# Patient Record
Sex: Female | Born: 1945
Health system: Southern US, Community
[De-identification: ages and names within clinical notes are randomized; demographics above are authoritative.]

## PROBLEM LIST (undated history)

## (undated) DIAGNOSIS — I10 Essential (primary) hypertension: Secondary | ICD-10-CM

## (undated) DIAGNOSIS — Z951 Presence of aortocoronary bypass graft: Secondary | ICD-10-CM

## (undated) DIAGNOSIS — I712 Thoracic aortic aneurysm, without rupture, unspecified: Secondary | ICD-10-CM

## (undated) DIAGNOSIS — I255 Ischemic cardiomyopathy: Secondary | ICD-10-CM

## (undated) DIAGNOSIS — F17200 Nicotine dependence, unspecified, uncomplicated: Secondary | ICD-10-CM

## (undated) DIAGNOSIS — I447 Left bundle-branch block, unspecified: Secondary | ICD-10-CM

## (undated) DIAGNOSIS — I219 Acute myocardial infarction, unspecified: Secondary | ICD-10-CM

## (undated) DIAGNOSIS — R0602 Shortness of breath: Secondary | ICD-10-CM

## (undated) DIAGNOSIS — I2510 Atherosclerotic heart disease of native coronary artery without angina pectoris: Secondary | ICD-10-CM

## (undated) HISTORY — PX: ABDOMINAL HYSTERECTOMY: SHX81

## (undated) HISTORY — PX: TUBAL LIGATION: SHX77

## (undated) HISTORY — PX: FRACTURE SURGERY: SHX138

## (undated) HISTORY — PX: DILATION AND CURETTAGE OF UTERUS: SHX78

---

## 2003-12-01 ENCOUNTER — Encounter: Admission: RE | Admit: 2003-12-01 | Discharge: 2004-02-29 | Payer: Self-pay | Admitting: Neurology

## 2010-08-11 ENCOUNTER — Emergency Department (HOSPITAL_COMMUNITY): Payer: Medicare Other

## 2010-08-11 ENCOUNTER — Inpatient Hospital Stay (HOSPITAL_COMMUNITY)
Admission: EM | Admit: 2010-08-11 | Discharge: 2010-08-15 | DRG: 481 | Disposition: A | Discharge status: Skilled Nursing Facility | Payer: Medicare Other | Attending: Orthopedic Surgery | Admitting: Orthopedic Surgery

## 2010-08-11 DIAGNOSIS — I252 Old myocardial infarction: Secondary | ICD-10-CM

## 2010-08-11 DIAGNOSIS — A5900 Urogenital trichomoniasis, unspecified: Secondary | ICD-10-CM | POA: Diagnosis present

## 2010-08-11 DIAGNOSIS — W010XXA Fall on same level from slipping, tripping and stumbling without subsequent striking against object, initial encounter: Secondary | ICD-10-CM | POA: Diagnosis present

## 2010-08-11 DIAGNOSIS — S72009A Fracture of unspecified part of neck of unspecified femur, initial encounter for closed fracture: Principal | ICD-10-CM | POA: Diagnosis present

## 2010-08-11 DIAGNOSIS — E876 Hypokalemia: Secondary | ICD-10-CM | POA: Diagnosis not present

## 2010-08-11 DIAGNOSIS — I82409 Acute embolism and thrombosis of unspecified deep veins of unspecified lower extremity: Secondary | ICD-10-CM | POA: Diagnosis present

## 2010-08-11 DIAGNOSIS — D62 Acute posthemorrhagic anemia: Secondary | ICD-10-CM | POA: Diagnosis not present

## 2010-08-11 DIAGNOSIS — I10 Essential (primary) hypertension: Secondary | ICD-10-CM | POA: Diagnosis present

## 2010-08-11 LAB — CBC
Hemoglobin: 10.7 g/dL — ABNORMAL LOW (ref 12.0–15.0)
MCH: 27.6 pg (ref 26.0–34.0)
MCHC: 32.3 g/dL (ref 30.0–36.0)
Platelets: 182 10*3/uL (ref 150–400)

## 2010-08-11 LAB — CK: Total CK: 448 U/L — ABNORMAL HIGH (ref 7–177)

## 2010-08-11 LAB — DIFFERENTIAL
Basophils Relative: 1 % (ref 0–1)
Eosinophils Absolute: 0 10*3/uL (ref 0.0–0.7)
Eosinophils Relative: 0 % (ref 0–5)
Monocytes Absolute: 0.6 10*3/uL (ref 0.1–1.0)
Monocytes Relative: 6 % (ref 3–12)

## 2010-08-11 LAB — URINALYSIS, ROUTINE W REFLEX MICROSCOPIC
Hgb urine dipstick: NEGATIVE
Nitrite: NEGATIVE
Specific Gravity, Urine: 1.021 (ref 1.005–1.030)
Urobilinogen, UA: 2 mg/dL — ABNORMAL HIGH (ref 0.0–1.0)

## 2010-08-11 LAB — COMPREHENSIVE METABOLIC PANEL
Albumin: 2.6 g/dL — ABNORMAL LOW (ref 3.5–5.2)
BUN: 10 mg/dL (ref 6–23)
Chloride: 101 mEq/L (ref 96–112)
Creatinine, Ser: 0.52 mg/dL (ref 0.4–1.2)
Total Bilirubin: 1.2 mg/dL (ref 0.3–1.2)

## 2010-08-11 LAB — PROTIME-INR
INR: 1 (ref 0.00–1.49)
Prothrombin Time: 13.4 seconds (ref 11.6–15.2)

## 2010-08-11 LAB — URINE MICROSCOPIC-ADD ON

## 2010-08-11 LAB — TROPONIN I: Troponin I: 0.06 ng/mL (ref 0.00–0.06)

## 2010-08-11 LAB — APTT: aPTT: 34 seconds (ref 24–37)

## 2010-08-12 DIAGNOSIS — M79609 Pain in unspecified limb: Secondary | ICD-10-CM

## 2010-08-12 LAB — URINE CULTURE
Colony Count: NO GROWTH
Culture  Setup Time: 201203182353

## 2010-08-12 LAB — CBC
Platelets: 131 10*3/uL — ABNORMAL LOW (ref 150–400)
RDW: 16.7 % — ABNORMAL HIGH (ref 11.5–15.5)
WBC: 9.9 10*3/uL (ref 4.0–10.5)

## 2010-08-12 LAB — BASIC METABOLIC PANEL
Calcium: 9.1 mg/dL (ref 8.4–10.5)
GFR calc Af Amer: >60 mL/min (ref >60)
GFR calc non Af Amer: >60 mL/min (ref >60)
Potassium: 4.1 mEq/L (ref 3.5–5.1)
Sodium: 139 mEq/L (ref 135–145)

## 2010-08-13 LAB — CBC
HCT: 24.9 % — ABNORMAL LOW (ref 36.0–46.0)
MCH: 27.3 pg (ref 26.0–34.0)
MCV: 87.1 fL (ref 78.0–100.0)
Platelets: 112 10*3/uL — ABNORMAL LOW (ref 150–400)
RDW: 16.9 % — ABNORMAL HIGH (ref 11.5–15.5)

## 2010-08-13 LAB — BASIC METABOLIC PANEL
CO2: 27 mEq/L (ref 19–32)
Calcium: 9 mg/dL (ref 8.4–10.5)
Chloride: 106 mEq/L (ref 96–112)
Glucose, Bld: 90 mg/dL (ref 70–99)
Sodium: 136 mEq/L (ref 135–145)

## 2010-08-13 LAB — ABO/RH: ABO/RH(D): O POS

## 2010-08-14 LAB — BASIC METABOLIC PANEL
BUN: 3 mg/dL — ABNORMAL LOW (ref 6–23)
CO2: 26 mEq/L (ref 19–32)
Calcium: 9.1 mg/dL (ref 8.4–10.5)
Chloride: 104 mEq/L (ref 96–112)
Creatinine, Ser: 0.49 mg/dL (ref 0.4–1.2)
GFR calc Af Amer: >60 mL/min (ref >60)

## 2010-08-14 LAB — CBC
HCT: 33.1 % — ABNORMAL LOW (ref 36.0–46.0)
MCHC: 32.9 g/dL (ref 30.0–36.0)
Platelets: 112 10*3/uL — ABNORMAL LOW (ref 150–400)
RDW: 15.6 % — ABNORMAL HIGH (ref 11.5–15.5)
WBC: 10.8 10*3/uL — ABNORMAL HIGH (ref 4.0–10.5)

## 2010-08-14 LAB — PROTIME-INR
INR: 1.27 (ref 0.00–1.49)
Prothrombin Time: 16.1 seconds — ABNORMAL HIGH (ref 11.6–15.2)

## 2010-08-14 LAB — CROSSMATCH
ABO/RH(D): O POS
Antibody Screen: NEGATIVE
Unit division: 0
Unit division: 0

## 2010-08-15 LAB — BASIC METABOLIC PANEL
BUN: 6 mg/dL (ref 6–23)
Calcium: 9.1 mg/dL (ref 8.4–10.5)
Creatinine, Ser: 0.46 mg/dL (ref 0.4–1.2)
GFR calc non Af Amer: >60 mL/min (ref >60)
Glucose, Bld: 180 mg/dL — ABNORMAL HIGH (ref 70–99)

## 2010-08-15 LAB — PROTIME-INR: Prothrombin Time: 15.5 seconds — ABNORMAL HIGH (ref 11.6–15.2)

## 2010-08-15 LAB — CBC
HCT: 32.9 % — ABNORMAL LOW (ref 36.0–46.0)
MCH: 27.8 pg (ref 26.0–34.0)
MCHC: 32.2 g/dL (ref 30.0–36.0)
MCV: 86.4 fL (ref 78.0–100.0)
Platelets: 136 10*3/uL — ABNORMAL LOW (ref 150–400)
RDW: 16.1 % — ABNORMAL HIGH (ref 11.5–15.5)
WBC: 11.2 10*3/uL — ABNORMAL HIGH (ref 4.0–10.5)

## 2010-08-19 NOTE — H&P (Signed)
NAME:  Kathryn Dixon, Kathryn Dixon               ACCOUNT NO.:  [id number]  MEDICAL RECORD NO.:  [id number]           PATIENT TYPE:  I  LOCATION:  1533                         FACILITY:  [facility]  PHYSICIAN:  Leonides Grills, M.D.     DATE OF BIRTH:  04-07-1946  DATE OF ADMISSION:  08/11/2010 DATE OF DISCHARGE:                             HISTORY & PHYSICAL   CHIEF COMPLAINT:  Left hip pain for 1 week.  HISTORY:  This is a 65 year old female who approximately a week ago fell at home and had immediate left hip pain.  She has been bed bound for a week.  She was brought in today by EMS for further evaluation and treatment.  She denies any diabetes, peripheral neuropathy, rheumatoid arthritis, gout, lupus, fever, chills, DVT history, or sleep apnea.  She does have a history of MI  approximately 10-12 years ago.  PAST MEDICAL HISTORY: 1. Hypertension. 2. MI.  PAST SURGICAL HISTORY:  None.  ALLERGIES:  She has no known drug allergies.  MEDICATIONS:  She does not take any medicine.  SOCIAL HISTORY:  She does smoke.  PHYSICAL EXAMINATION:  GENERAL:  She is a well-nourished, well- developed, very pleasant female, in no apparent distress, alert and oriented x3. VITAL SIGNS:  Respirations 18, blood pressure 120/74, temperature is 98.3, pulse is 89. HEENT:  She Normocephalic, atraumatic.  Extraocular movements are intact, equal bilaterally. CHEST:  Equal bilateral expansion, contracted breathing.  Chest is clear to auscultation. HEART:  There are no murmurs, rubs, or gallops. ABDOMEN:  Soft, nontender.  Normal bowel sounds. NEURO:  Sensation intact to light touch over the C5 through T1 distribution as well as over the L4 through S1 distribution, equal bilaterally. EXTREMITIES:  She has palpable radial pulse bilaterally and palpable dorsalis pedis pulse bilaterally, but it is very faint in bilateral lower extremities.  We did not range the hip due to pain in the hip, obviously, left hip.  She is  nontender in bilateral knees, ankles, feet, shoulders, elbows, wrists, and hands.  IMAGING STUDIES:  X-rays, AP and lateral views of the hip show a nondisplaced left femoral neck fracture.  Four views of the knee show no evidence of fracture.  Chest x-ray was clear.  No acute cardiopulmonary disease.  EKG was also reviewed and shows sinus tachycardia, left atrial enlargement, left axis deviation, and left bundle-branch block per the EKG read.  LABORATORY DATA:  White count is 9.2, hemoglobin is 10.7, hematocrit is 33.1, platelets are 182.  Sodium is 129, potassium is 3.3, chloride is 101, carbon dioxide is 26, glucose is 138, BUN is 10, creatinine 0.52. Urine shows moderate leukocyte esterase, many epithelials, white blood cells 11-20, and many bacteria and cultures are pending.  IMPRESSION: 1. Nondisplaced left femoral neck fracture. 2. Probable urinary tract infection.  PLAN:  We will get preop clearance by Internal Medicine.  We explained to the patient that due to the fact that she has a hip fracture, this will need closed reduction and percutaneous screw fixation and she wished to proceed.  We went over the risks of infection, vessel injury, loss of fixation, screw cut  out, persistent pain, worsening, prolonged recovery, stiffness, arthritis, wound healing problems, DVT, PE, and even death were all explained.  Questions were encouraged and answered. We will proceed with this in the near future.     Leonides Grills, M.D.     PB/MEDQ  D:  08/11/2010  T:  08/12/2010  Job:  [id number]  Electronically Signed by Leonides Grills M.D. on 08/19/2010 05:18:34 PM

## 2010-08-19 NOTE — Op Note (Signed)
NAME:  Kathryn Dixon, Kathryn Dixon               ACCOUNT NO.:  [id number]  MEDICAL RECORD NO.:  [id number]           PATIENT TYPE:  E  LOCATION:  WLED                         FACILITY:  [facility]  PHYSICIAN:  Leonides Grills, M.D.     DATE OF BIRTH:  06/22/1945  DATE OF PROCEDURE:  08/11/2010 DATE OF DISCHARGE:                              OPERATIVE REPORT   PREOPERATIVE DIAGNOSIS:  Nondisplaced left femoral neck fracture.  POSTOPERATIVE DIAGNOSIS:  Nondisplaced left femoral neck fracture.  OPERATION:  Closed reduction screw fixation left femoral neck fracture.  ANESTHESIA:  General.  SURGEON:  Leonides Grills, M.D.  ASSISTANT:  Richardean Canal, P.A.  ESTIMATED BLOOD LOSS:  Minimal.  COMPLICATIONS:  None.  IMPLANTS:  Synthes 6.5 mm cannulated screws x3.  DISPOSITION:  Stable to the PR.  INDICATIONS:  This is a 65 year old female who fell about a week ago and has had persistent left hip pain and has been essentially bed-bound. She then presented to the ER, her x-rays were obtained, and she was found to have a nondisplaced left femoral neck fracture.  She consented for the above procedure.  All risks of infection or vessel injury, nonunion, malunion, hardware rotation, hardware failure, screw head cutout, persistent pain, worse pain, prolonged recovery, wound healing problems, DVT, PE were all explained, questions were encouraged and answered.  OPERATION:  The patient brought to the operating room, placed in supine position after adequate general anesthesia was administered as well as Ancef 1 g IV piggyback.  The right lower extremity was placed in lithotomy position and the left lower extremity was placed in a straight extension position with slight traction, a very slight reduction.  A light general internal rotation was then performed and x-rays were obtained in AP and lateral planes that showed anatomic reduction.  We then prepped and draped the left hip in a sterile manner and under  C-arm guidance we mapped out the orientation of the left femoral neck.  We then made a longitudinal incision over the lateral aspect of the femur. We then placed a guide wire down first in the center of the head and then placed 2 guide wires both anterior and posterior to the inferior surface of the femoral neck, all of which parallel in orientation, this was verified under C-arm guidance in AP and lateral planes.  We then measured this out and 2 screws were 85 mm long and 1 screw was 80 mm long.  We then placed three 6.5 mm cannulated Synthes titanium screws self-drilling, self-tapping, 2 of which had 32 mm thread and 1 of which had 16 mm thread.  This had excellent purchase and maintenance of the anatomic reduction.  K-wires were removed.  Final x-rays were obtained in AP and lateral planes that showed no gross motion, fixation, proposition, excellent alignment as well.  The wound was copiously irrigated with saline.  Subcu was closed with #0 Vicryl and skin was closed with staples.  Sterile dressing was applied.  The patient was stable to PR.     Leonides Grills, M.D.     PB/MEDQ  D:  08/11/2010  T:  08/12/2010  Job:  [id number]  Electronically Signed by Leonides Grills M.D. on 08/19/2010 05:18:36 PM

## 2010-09-14 NOTE — Discharge Summary (Signed)
NAME:  Kathryn Dixon, Kathryn Dixon               ACCOUNT NO.:  [id number]  MEDICAL RECORD NO.:  [id number]           PATIENT TYPE:  I  LOCATION:  1533                         FACILITY:  [facility]  PHYSICIAN:  Leonides Grills, M.D.     DATE OF BIRTH:  27-Sep-1945  DATE OF ADMISSION:  08/11/2010 DATE OF DISCHARGE:                              DISCHARGE SUMMARY   ADMITTING DIAGNOSES: 1. Hypokalemia. 2. Urinary tract infection. 3. Anemia. 4. Left bundle-branch block with sinus tachycardia. 5. Left hip pain. 6. Hypertension. 7. Myocardial infarction.  DISCHARGE DIAGNOSES: 1. Status post closed reduction, screw fixation, left femoral neck     fracture. 2. Hypokalemia, resolved. 3. Acute blood loss anemia secondary to surgery on chronic anemia     status post 2 units of packed red blood cells. 4. Hypokalemia, resolved. 5. Hypertension. 6. History of myocardial infarction. 7. Hypomagnesemia, resolved . 8. Urinary tract infection/Trichomonas infection, treated. 9. Left lower leg deep venous thrombosis on Doppler, currently on     Coumadin with Lovenox bridge until Coumadin therapeutic.  HISTORY OF PRESENT ILLNESS:  This is a 65 year old female who fell approximately a week ago, had persistent left hip pain, has been essentially bed bound.  The patient was involuntarily admitted by family, brought to the ER by EMS for evaluation and treatment of the left hip.  She denies any loss of consciousness, chest pain, or dizziness at the time of fall.  The only injury sustained during the fall was the left hip injury.  She has been unable to bear weight on the left hip since the injury.  She denies diabetes, peripheral neuropathy, rheumatoid arthritis, gout, lupus, fevers, chills, DVT history, or sleep apnea.  She does have a history of MI approximately 10-12 years ago and hypertension.  PAST MEDICAL HISTORY: 1. Hypertension. 2. History of MI 10-12 years ago.  PAST SURGICAL HISTORY:  None.  DRUG  ALLERGIES:  None.  MEDICATIONS:  None.  SOCIAL HISTORY:  She smokes.  She lives alone.  SURGICAL PROCEDURES:  The patient was taken to the operating room on August 11, 2010, by Leonides Grills, assisted by Richardean Canal, PA-C.  The patient was placed under general anesthesia and then underwent a closed screw fixation of the left femoral neck fracture.  The patient tolerated the procedure well.  HOSPITAL COURSE:  In the PACU, there was some concern of possible alcohol abuse, therefore, Ativan protocol was initiated.  Postop day #1, the patient overall stated that she felt much improved.  Afebrile, vital signs stable.  Doppler was performed of the patient's bilateral lower extremities.  The patient was found to have a left lower extremity DVT occlusive of the proximal posterior tibial, popliteal, femoral, and common femoral vein and also superficial thrombosis of the proximal greater saphenous vein.  No evidence of Baker cyst.  The patient was placed on bed rest for 24 hours, full dose Lovenox therapy, Coumadin per DVT protocol per pharmacy.  Postop day #2, the patient doing well. Denied shortness of breath, chest pain, dizziness.  The patient has been on strict bed rest.  The patient afebrile, vital signs stable.  White  count 10,400, hemoglobin 7.8, hematocrit 24.9.  PT 15.8, INR of 1.24. UA showed trace ketones, negative protein, negative nitrites, positive leukocytes and bacteria, positive Trichomonas.  The patient was typed and crossed for 2 units of packed red blood cells which were given due to her anemia.  Trichomonas was treated with Flagyl.  The patient was weightbearing as tolerated to the left leg.  The patient was also found to have hypomagnesemia and this was replaced.  It should be noted the patient was on Cipro for empirical treatment of UTI.  Postop day #3, the patient overall doing well, pain under good control, tolerating diet well, voiding well, no bowel movement.   Afebrile, vital signs stable, hemoglobin 10.9, hematocrit 33.1, white count 10,800.  PT was 16.1, INR 1.27.  Hypomagnesemia resolved with a magnesium level of 1.7.  Due to the patient's lack of bowel movement, Dulcolax 10 mg p.o./p.r. x1 was given. The patient was treated with one dose of Flagyl for Trichomonas. She is to remain on Cipro for a total of 3 days.  She is undergoing Coumadin bridging with Lovenox.  Her Lovenox was changed to preferred dosing for DVT which is 1.5 mg/kg 90 mg q.24 h.  The patient was deemed medically and orthopedically stable for discharge to skilled facility when bed available.  Postop day #4, the patient doing well, no complaints, passed bowel movement.  T-max 99.5, vital signs stable. White count 11,200, hemoglobin 10.6, hematocrit 32.9.  PT is 15.5, INR 1.2.  Sodium 135, potassium 3.5, chloride 104, bicarb 26, BUN 6, creatinine 0.46.  The patient at this point was able to transfer from bed to bedside commode and chair.  Again, she is weightbearing as tolerated.  Medically and orthopedically ready for discharge to skilled facility.  Left hip wound was benign, well approximated with staples.  CONSULTS:  Following consults were obtained while the patient was hospitalized:  PT, OT, case management, and [facility].  RADIOGRAPHS: 1. Left hip 2 views showed a nondisplaced left femoral neck fracture     and osteopenia, extensive peripheral vascular calcification also     was seen.  No evidence of right hip fracture on the AP pelvis.     This is dated August 11, 2010. 2. Four-view left knee dated August 11, 2010, showed no acute findings,     osteopenia. 3. Chest x-ray also dated August 11, 2010, showed no acute     cardiopulmonary disease. 4. Hip films dated August 11, 2010, at 1600 hours showed the patient to     be status post ORIF of a left hip fracture without evidence of     complication.  LABORATORY DATA ON ADMISSION:  UA was positive for leukocyte  esterase, white blood cells 11-20, many bacteria, and Trichomonas present.  CBC significant for hemoglobin 10.7, hematocrit 33.1, MCV 85.5.  Complete metabolic panel was significant for potassium of 3.3, creatinine 0.52, and sodium 139. Normal LFTs.  MEDICATIONS ON FLOOR: 1. Colace 100 mg b.i.d. 2. Aspirin 81 mg one p.o. daily. 3. Vitamin B1 100 mg p.o. daily. 4. Folic acid 1 mg p.o. daily. 5. Multivitamin 1 capsule p.o. daily. 6. Ativan 4 mg IV q.12 h. p.r.n. 7. Antiseptic rinse 15 mL at 8 a.m. and 8 p.m. 8. Coumadin per pharmacy protocol. 9. Lovenox 90 mg 1 injection subcu daily. 10.Morphine sulfate 3-4 mg IV q.4 h. p.r.n. 11.Reglan 10 mg p.o. q.8 h. p.r.n. 12.Reglan 10 mg IV q.8 h. p.r.n. 13.Oxycodone 5/325 one to two p.o. q.4-6  h. p.r.n. pain. 14.Robaxin 500 mg one p.o. q.8 h. p.r.n. 15.Ventolin 2.5 mg q.6 h. p.r.n. 16.Robaxin 500 mg IV q.8 h. p.r.n.  DISCHARGE INSTRUCTIONS: 1. Floor meds to be adjusted by skilled nursing facility physician. 2. Weightbearing status.  The patient is weightbearing as tolerated on     the left leg. 3. Wound care.  Wound is to be kept clean, dry with daily dressing     changes.  Staples come out at 2 weeks postop at her first postop     visit with Dr. Lestine Box. 4. Diet.  Regular diet.  SPECIAL INSTRUCTIONS:  The patient on Coumadin for DVT of the left lower leg with Lovenox bridging.  Discontinue Lovenox once Coumadin therapeutic.  FOLLOWUP:  The patient to follow up with Dr. Lestine Box in office 2 weeks postop.  Please call office at [phone] for appointment.  CONDITION ON DISCHARGE:  The patient discharged to skilled facility in good, stable condition.     Richardean Canal, P.A.   ______________________________ Leonides Grills, M.D.    GC/MEDQ  D:  08/15/2010  T:  08/15/2010  Job:  [id number]  cc:   Leonides Grills, M.D. Fax: [phone]  Electronically Signed by Richardean Canal P.A. on 08/21/2010 02:46:18 PM Electronically Signed by Leonides Grills M.D. on 09/14/2010 07:49:23 AM

## 2010-09-26 NOTE — Consult Note (Signed)
NAME:  Kathryn Dixon, Kathryn Dixon NO.:  [id number]  MEDICAL RECORD NO.:  [id number]           PATIENT TYPE:  E  LOCATION:  WLED                         FACILITY:  [facility]  PHYSICIAN:  Jonny Ruiz, MD    DATE OF BIRTH:  [birth year]  DATE OF CONSULTATION:  08/11/2010 DATE OF DISCHARGE:                                CONSULTATION   REFERRING PHYSICIAN:  Orthopedics.  CHIEF COMPLAINT:  Clearance for surgery.  HISTORY OF PRESENT ILLNESS:  The patient is a 65 year old female who presented to the emergency department 8 days after a fall at home. Evaluation in the ED revealed the presence of a nondisplaced left hip fracture and she was incidentally found with hypokalemia, UTI, anemia, left bundle branch block with sinus tachycardia.  We have been asked to evaluate the patient for preop evaluation.  The patient was found sitting on a stretcher and alert.  She was in on distress.  She complains of left hip pain with movement only.  She specifically denies chest pain, shortness of breath, or palpitations. She denies dysuria, frequency, hematuria, fever, or chills.  The patient tells me that her only past medical history is high blood pressure for which she was not taking any medication and a myocardial infarction 15 years ago for which she was admitted to [facility] and had a cardiac catheterization.  The patient does not take any medications including aspirin for her previous MI.  PAST MEDICAL HISTORY:  As detailed in the HPI.  CURRENT MEDICATIONS:  No known drug allergies.  FAMILY HISTORY:  Noncontributory.  PAST SURGICAL HISTORY:  None.  SOCIAL HISTORY:  The patient is a current smoker, no alcohol, no illicits.  REVIEW OF SYSTEMS:  As in HPI.  PHYSICAL EXAMINATION:  VITAL SIGNS:  Blood pressure 108/84, heart rate 116, respiration 20, temperature 98.3. GENERAL APPEARANCE:  She is a slim African American woman, in no acute distress. HEENT:   Unremarkable. NECK:  Supple and no JVD. HEART:  With a displaced PMI, regular S1 and S2.  There is an end- diastolic murmur at the precordium, with an S3 gallop. LUNGS:  Clear to auscultation. ABDOMEN:  Nondistended, soft without tenderness or rebounding.  No hepatosplenomegaly or masses. EXTREMITIES:  Without edema. NEUROLOGIC:  Nonfocal.  LABORATORY DATA:  Reviewed, she was found on the urinalysis with positive leukocyte esterase, WBCs 11-20, many bacteria, and trichomonas present.  Her chest x-ray is negative.  Complete metabolic panel is significant for potassium at 3.3, creatinine 0.52, sodium 139.  Normal LFTs.  Total CK 448.  CBC significant for 10.7, hematocrit 33.1, MCV 85.5.  The left knee x-ray is negative and the left hip shows a nondisplaced left femoral neck fracture with incidental osteopenia.  IMPRESSION:  A 65 year old black female who is being admitted for a left hip fracture repair and was incidentally found with hypokalemia, mild anemia, asymptomatic urinary tract infection, and a left bundle branch block and sinus tachycardia in the setting of previous myocardial infarction 15 years ago without active symptoms.  RECOMMENDATIONS:  Proceed with surgery as scheduled.  Obtain urine culture stat.  Start ciprofloxacin 500 p.o.  b.i.d. for 3 days.  Replace potassium with K-Dur 20 mEq p.o. b.i.d. for 2 days and start aspirin 81 mg starting, August 12, 2010.  In a.m., repeat CBC to monitor her hemoglobin and BMP to monitor her potassium as well as her magnesium in a.m.  If blood pressure starts to rise, consider ACE inhibitor.          ______________________________ Jonny Ruiz, MD     GL/MEDQ  D:  08/11/2010  T:  08/11/2010  Job:  [id number]  Electronically Signed by Jonny Ruiz MD on 09/26/2010 08:44:17 AM

## 2012-06-22 ENCOUNTER — Emergency Department (HOSPITAL_COMMUNITY): Payer: Medicare Other

## 2012-06-22 ENCOUNTER — Encounter (HOSPITAL_COMMUNITY): Payer: Self-pay | Admitting: *Deleted

## 2012-06-22 ENCOUNTER — Emergency Department (HOSPITAL_COMMUNITY)
Admission: EM | Admit: 2012-06-22 | Discharge: 2012-06-22 | Disposition: A | Discharge status: Home / Self Care | Payer: Medicare Other | Attending: Emergency Medicine | Admitting: Emergency Medicine

## 2012-06-22 DIAGNOSIS — S42209A Unspecified fracture of upper end of unspecified humerus, initial encounter for closed fracture: Secondary | ICD-10-CM | POA: Insufficient documentation

## 2012-06-22 DIAGNOSIS — W1809XA Striking against other object with subsequent fall, initial encounter: Secondary | ICD-10-CM | POA: Insufficient documentation

## 2012-06-22 DIAGNOSIS — I2510 Atherosclerotic heart disease of native coronary artery without angina pectoris: Secondary | ICD-10-CM | POA: Insufficient documentation

## 2012-06-22 DIAGNOSIS — Y939 Activity, unspecified: Secondary | ICD-10-CM | POA: Insufficient documentation

## 2012-06-22 DIAGNOSIS — I10 Essential (primary) hypertension: Secondary | ICD-10-CM | POA: Insufficient documentation

## 2012-06-22 DIAGNOSIS — S42309A Unspecified fracture of shaft of humerus, unspecified arm, initial encounter for closed fracture: Secondary | ICD-10-CM

## 2012-06-22 DIAGNOSIS — F17200 Nicotine dependence, unspecified, uncomplicated: Secondary | ICD-10-CM | POA: Insufficient documentation

## 2012-06-22 DIAGNOSIS — W010XXA Fall on same level from slipping, tripping and stumbling without subsequent striking against object, initial encounter: Secondary | ICD-10-CM | POA: Insufficient documentation

## 2012-06-22 DIAGNOSIS — Y929 Unspecified place or not applicable: Secondary | ICD-10-CM | POA: Insufficient documentation

## 2012-06-22 HISTORY — DX: Atherosclerotic heart disease of native coronary artery without angina pectoris: I25.10

## 2012-06-22 HISTORY — DX: Essential (primary) hypertension: I10

## 2012-06-22 MED ORDER — HYDROCODONE-ACETAMINOPHEN 5-325 MG PO TABS
1.0000 | ORAL_TABLET | ORAL | Status: DC | PRN
Start: 2012-06-22 — End: 2013-01-18

## 2012-06-22 MED ORDER — HYDROCODONE-ACETAMINOPHEN 5-325 MG PO TABS
1.0000 | ORAL_TABLET | Freq: Once | ORAL | Status: AC
  Administered 2012-06-22: 1 via ORAL
  Filled 2012-06-22: qty 1

## 2012-06-22 NOTE — ED Notes (Signed)
Ortho Tech in department apply sling

## 2012-06-22 NOTE — ED Provider Notes (Signed)
Medical screening examination/treatment/procedure(s) were performed by non-physician practitioner and as supervising physician I was immediately available for consultation/collaboration.  Tobin Chad, MD 06/22/12 [phone]

## 2012-06-22 NOTE — ED Provider Notes (Signed)
History     CSN: [id number]  Arrival date & time 06/22/12  8657   First MD Initiated Contact with Patient 06/22/12 0915      Chief Complaint  Patient presents with  . Fall  . Arm Pain    left    (Consider location/radiation/quality/duration/timing/severity/associated sxs/prior treatment) Patient is a 67 y.o. female presenting with fall. The history is provided by the patient and a relative.  Fall The accident occurred 2 days ago. The point of impact was the left shoulder. The pain is moderate. She was ambulatory at the scene. There was no entrapment after the fall. There was no drug use involved in the accident. There was no alcohol use involved in the accident. Pertinent negatives include no fever, no abdominal pain, no nausea and no headaches. Associated symptoms comments: She reports she tripped and fell 2 days ago, hitting her left upper arm against a piece of furniture and then falling on the floor, again onto the left upper arm. She denies hitting her head. She has no neck pain, chest or abdominal discomfort, nausea, vomiting. She complains only of pain at the site of impact to left upper arm and decreased use of arm secondary to pain..    Past Medical History  Diagnosis Date  . Hypertension   . Coronary artery disease     Past Surgical History  Procedure Date  . Abdominal hysterectomy   . Fracture surgery     History reviewed. No pertinent family history.  History  Substance Use Topics  . Smoking status: Current Every Day Smoker -- 1.0 packs/day    Types: Cigarettes  . Smokeless tobacco: Never Used  . Alcohol Use: Yes     Comment: occ    OB History    Grav Para Term Preterm Abortions TAB SAB Ect Mult Living                  Review of Systems  Constitutional: Negative for fever and chills.  HENT: Negative.  Negative for neck pain.   Eyes: Negative for visual disturbance.  Respiratory: Negative.  Negative for shortness of breath.   Cardiovascular:  Negative.  Negative for chest pain.  Gastrointestinal: Negative.  Negative for nausea and abdominal pain.  Musculoskeletal:       See HPI.  Skin: Negative.  Negative for wound.  Neurological: Negative.  Negative for syncope, weakness and headaches.  Hematological: Does not bruise/bleed easily.  Psychiatric/Behavioral: Negative for confusion.    Allergies  Review of patient's allergies indicates no known allergies.  Home Medications   Current Outpatient Rx  Name  Route  Sig  Dispense  Refill  . HYDROCODONE-ACETAMINOPHEN 5-325 MG PO TABS   Oral   Take 1 tablet by mouth every 4 (four) hours as needed for pain.   20 tablet   0     BP 122/74  Pulse 86  Temp 98.4 F (36.9 C) (Oral)  Resp 16  Wt 110 lb (49.896 kg)  SpO2 100%  Physical Exam  Constitutional: She is oriented to person, place, and time. She appears well-developed and well-nourished.  HENT:  Head: Atraumatic.  Neck: Normal range of motion.  Cardiovascular:       Pulses distally are intact.   Pulmonary/Chest: Effort normal.  Musculoskeletal:       No neck and spinal pain. Moves all extremities with the exception of the left arm. Swelling that is moderate to upper arm distal to shoulder. Grip strength is 5/5 without hand wrist or  elbow pain.  Neurological: She is alert and oriented to person, place, and time.  Skin: Skin is warm and dry.  Psychiatric: She has a normal mood and affect.    ED Course  Procedures (including critical care time)  Labs Reviewed - No data to display Dg Humerus Left  06/22/2012  *RADIOLOGY REPORT*  Clinical Data: Fall, arm pain  LEFT HUMERUS - 2+ VIEW  Comparison: None.  Findings: Acute spiral fracture through the upper humeral diaphysis.  The distal fracture fragment is displaced medially by one full shaft width. The fracture line extends upward through the metadiaphysis and into the greater tuberosity.  The visualized elbow appears intact.  There is associated soft tissue swelling.   IMPRESSION:  Acute spiral fracture of the proximal humeral metadiaphysis.  The fracture is complete in the proximal diaphysis with one shaft width medial displacement of the distal fracture fragment.  An incomplete component of the fracture line extends upward through the metaphysis and into the greater tuberosity.   Original Report Authenticated By: Malachy Moan, M.D.      1. Humerus fracture       MDM  Fracture of humerus on x-ray. Fall was mechanical fall and patient has has been active to her usual level of activity since that time. No chest pain, abdominal pain. No change in appetite or mentation per family member. Suspect mechanical fall with injury limited to left arm. Immobilizer applied. She is referred to [facility] - has seen Dr. Lestine Box in the past.        Arnoldo Hooker, PA-C 06/22/12 1540

## 2012-06-22 NOTE — ED Notes (Signed)
Pt from home with reports of losing balance and falling against a dresser in her bedroom injuring left arm. Pt denies syncope or hitting head.

## 2012-06-22 NOTE — ED Notes (Signed)
Voiced understanding of instructions given 

## 2013-01-18 ENCOUNTER — Emergency Department (HOSPITAL_COMMUNITY): Payer: Medicare Other

## 2013-01-18 ENCOUNTER — Inpatient Hospital Stay (HOSPITAL_COMMUNITY): Payer: Medicare Other

## 2013-01-18 ENCOUNTER — Encounter (HOSPITAL_COMMUNITY): Payer: Self-pay | Admitting: *Deleted

## 2013-01-18 ENCOUNTER — Encounter (HOSPITAL_COMMUNITY)
Admission: EM | Disposition: A | Discharge status: Home / Self Care | Payer: Self-pay | Source: Home / Self Care | Attending: Cardiovascular Disease

## 2013-01-18 ENCOUNTER — Inpatient Hospital Stay (HOSPITAL_COMMUNITY)
Admission: EM | Admit: 2013-01-18 | Discharge: 2013-02-01 | DRG: 233 | Disposition: A | Discharge status: Home / Self Care | Payer: Medicare Other | Attending: Surgery | Admitting: Surgery

## 2013-01-18 DIAGNOSIS — R0989 Other specified symptoms and signs involving the circulatory and respiratory systems: Secondary | ICD-10-CM

## 2013-01-18 DIAGNOSIS — Z951 Presence of aortocoronary bypass graft: Secondary | ICD-10-CM

## 2013-01-18 DIAGNOSIS — F17200 Nicotine dependence, unspecified, uncomplicated: Secondary | ICD-10-CM | POA: Diagnosis present

## 2013-01-18 DIAGNOSIS — E785 Hyperlipidemia, unspecified: Secondary | ICD-10-CM | POA: Diagnosis present

## 2013-01-18 DIAGNOSIS — I4949 Other premature depolarization: Secondary | ICD-10-CM | POA: Diagnosis not present

## 2013-01-18 DIAGNOSIS — D62 Acute posthemorrhagic anemia: Secondary | ICD-10-CM | POA: Diagnosis not present

## 2013-01-18 DIAGNOSIS — I214 Non-ST elevation (NSTEMI) myocardial infarction: Secondary | ICD-10-CM | POA: Diagnosis present

## 2013-01-18 DIAGNOSIS — I2510 Atherosclerotic heart disease of native coronary artery without angina pectoris: Secondary | ICD-10-CM | POA: Diagnosis present

## 2013-01-18 DIAGNOSIS — I255 Ischemic cardiomyopathy: Secondary | ICD-10-CM | POA: Diagnosis present

## 2013-01-18 DIAGNOSIS — R0609 Other forms of dyspnea: Secondary | ICD-10-CM | POA: Diagnosis present

## 2013-01-18 DIAGNOSIS — I729 Aneurysm of unspecified site: Secondary | ICD-10-CM | POA: Diagnosis present

## 2013-01-18 DIAGNOSIS — I6529 Occlusion and stenosis of unspecified carotid artery: Secondary | ICD-10-CM | POA: Diagnosis present

## 2013-01-18 DIAGNOSIS — I2589 Other forms of chronic ischemic heart disease: Secondary | ICD-10-CM | POA: Diagnosis present

## 2013-01-18 DIAGNOSIS — I712 Thoracic aortic aneurysm, without rupture, unspecified: Secondary | ICD-10-CM | POA: Diagnosis present

## 2013-01-18 DIAGNOSIS — I2582 Chronic total occlusion of coronary artery: Secondary | ICD-10-CM | POA: Diagnosis present

## 2013-01-18 DIAGNOSIS — I5021 Acute systolic (congestive) heart failure: Secondary | ICD-10-CM

## 2013-01-18 DIAGNOSIS — E876 Hypokalemia: Secondary | ICD-10-CM | POA: Diagnosis present

## 2013-01-18 DIAGNOSIS — I10 Essential (primary) hypertension: Secondary | ICD-10-CM | POA: Diagnosis present

## 2013-01-18 DIAGNOSIS — I252 Old myocardial infarction: Secondary | ICD-10-CM | POA: Diagnosis present

## 2013-01-18 DIAGNOSIS — E119 Type 2 diabetes mellitus without complications: Secondary | ICD-10-CM | POA: Diagnosis present

## 2013-01-18 DIAGNOSIS — R0600 Dyspnea, unspecified: Secondary | ICD-10-CM | POA: Diagnosis present

## 2013-01-18 DIAGNOSIS — Z720 Tobacco use: Secondary | ICD-10-CM

## 2013-01-18 DIAGNOSIS — I509 Heart failure, unspecified: Secondary | ICD-10-CM | POA: Diagnosis present

## 2013-01-18 DIAGNOSIS — Z79899 Other long term (current) drug therapy: Secondary | ICD-10-CM

## 2013-01-18 DIAGNOSIS — I498 Other specified cardiac arrhythmias: Secondary | ICD-10-CM | POA: Diagnosis not present

## 2013-01-18 DIAGNOSIS — I658 Occlusion and stenosis of other precerebral arteries: Secondary | ICD-10-CM | POA: Diagnosis present

## 2013-01-18 DIAGNOSIS — I447 Left bundle-branch block, unspecified: Secondary | ICD-10-CM | POA: Diagnosis present

## 2013-01-18 DIAGNOSIS — I059 Rheumatic mitral valve disease, unspecified: Secondary | ICD-10-CM | POA: Diagnosis present

## 2013-01-18 DIAGNOSIS — A498 Other bacterial infections of unspecified site: Secondary | ICD-10-CM | POA: Diagnosis present

## 2013-01-18 DIAGNOSIS — I5041 Acute combined systolic (congestive) and diastolic (congestive) heart failure: Secondary | ICD-10-CM | POA: Diagnosis present

## 2013-01-18 DIAGNOSIS — R739 Hyperglycemia, unspecified: Secondary | ICD-10-CM

## 2013-01-18 DIAGNOSIS — Z7982 Long term (current) use of aspirin: Secondary | ICD-10-CM

## 2013-01-18 DIAGNOSIS — I2789 Other specified pulmonary heart diseases: Secondary | ICD-10-CM | POA: Diagnosis present

## 2013-01-18 DIAGNOSIS — N390 Urinary tract infection, site not specified: Secondary | ICD-10-CM | POA: Diagnosis present

## 2013-01-18 HISTORY — DX: Ischemic cardiomyopathy: I25.5

## 2013-01-18 HISTORY — DX: Thoracic aortic aneurysm, without rupture, unspecified: I71.20

## 2013-01-18 HISTORY — DX: Nicotine dependence, unspecified, uncomplicated: F17.200

## 2013-01-18 HISTORY — DX: Thoracic aortic aneurysm, without rupture: I71.2

## 2013-01-18 HISTORY — DX: Presence of aortocoronary bypass graft: Z95.1

## 2013-01-18 HISTORY — DX: Left bundle-branch block, unspecified: I44.7

## 2013-01-18 LAB — POCT I-STAT, CHEM 8
BUN: 17 mg/dL (ref 6–23)
Calcium, Ion: 1.22 mmol/L (ref 1.13–1.30)
Creatinine, Ser: 0.7 mg/dL (ref 0.50–1.10)
Sodium: 140 mEq/L (ref 135–145)
TCO2: 19 mmol/L (ref 0–100)

## 2013-01-18 LAB — CBC
Hemoglobin: 12.7 g/dL (ref 12.0–15.0)
MCH: 27.5 pg (ref 26.0–34.0)
MCHC: 33.5 g/dL (ref 30.0–36.0)
RDW: 19.2 % — ABNORMAL HIGH (ref 11.5–15.5)

## 2013-01-18 LAB — BASIC METABOLIC PANEL
BUN: 16 mg/dL (ref 6–23)
Creatinine, Ser: 0.67 mg/dL (ref 0.50–1.10)
GFR calc Af Amer: >90 mL/min (ref >90)
GFR calc non Af Amer: 88 mL/min — ABNORMAL LOW (ref >90)
Glucose, Bld: 288 mg/dL — ABNORMAL HIGH (ref 70–99)
Potassium: 3.7 mEq/L (ref 3.5–5.1)

## 2013-01-18 LAB — POCT I-STAT TROPONIN I

## 2013-01-18 LAB — URINE MICROSCOPIC-ADD ON

## 2013-01-18 LAB — MRSA PCR SCREENING: MRSA by PCR: NEGATIVE

## 2013-01-18 LAB — URINALYSIS, ROUTINE W REFLEX MICROSCOPIC
Bilirubin Urine: NEGATIVE
Ketones, ur: NEGATIVE mg/dL
Nitrite: NEGATIVE
Protein, ur: NEGATIVE mg/dL
Urobilinogen, UA: 0.2 mg/dL (ref 0.0–1.0)
pH: 6 (ref 5.0–8.0)

## 2013-01-18 LAB — PROTIME-INR
INR: 1.1 (ref 0.00–1.49)
Prothrombin Time: 14 seconds (ref 11.6–15.2)

## 2013-01-18 LAB — GLUCOSE, CAPILLARY: Glucose-Capillary: 92 mg/dL (ref 70–99)

## 2013-01-18 SURGERY — LEFT HEART CATHETERIZATION WITH CORONARY ANGIOGRAM
Anesthesia: LOCAL

## 2013-01-18 MED ORDER — HEPARIN SODIUM (PORCINE) 5000 UNIT/ML IJ SOLN
INTRAMUSCULAR | Status: AC
  Filled 2013-01-18: qty 1

## 2013-01-18 MED ORDER — ATORVASTATIN CALCIUM 10 MG PO TABS
10.0000 mg | ORAL_TABLET | Freq: Every day | ORAL | Status: DC
  Administered 2013-01-19 – 2013-01-31 (×12): 10 mg via ORAL
  Filled 2013-01-18 (×16): qty 1

## 2013-01-18 MED ORDER — SODIUM CHLORIDE 0.9 % IV SOLN
INTRAVENOUS | Status: DC
  Administered 2013-01-18: 12:00:00 via INTRAVENOUS

## 2013-01-18 MED ORDER — SODIUM CHLORIDE 0.9 % IV SOLN
INTRAVENOUS | Status: DC
  Administered 2013-01-19: 08:00:00 via INTRAVENOUS

## 2013-01-18 MED ORDER — HEPARIN (PORCINE) IN NACL 100-0.45 UNIT/ML-% IJ SOLN
600.0000 [IU]/h | INTRAMUSCULAR | Status: DC
  Administered 2013-01-18: 600 [IU]/h via INTRAVENOUS
  Filled 2013-01-18: qty 250

## 2013-01-18 MED ORDER — ASPIRIN 81 MG PO CHEW
324.0000 mg | CHEWABLE_TABLET | Freq: Once | ORAL | Status: AC
Start: 2013-01-18 — End: 2013-01-18
  Administered 2013-01-18: 324 mg via ORAL
  Filled 2013-01-18: qty 4

## 2013-01-18 MED ORDER — IOHEXOL 350 MG/ML SOLN
100.0000 mL | Freq: Once | INTRAVENOUS | Status: AC | PRN
  Administered 2013-01-18: 100 mL via INTRAVENOUS

## 2013-01-18 MED ORDER — ACETAMINOPHEN 325 MG PO TABS: 650.0000 mg | ORAL_TABLET | ORAL | Status: DC | PRN

## 2013-01-18 MED ORDER — HEPARIN (PORCINE) IN NACL 100-0.45 UNIT/ML-% IJ SOLN
1100.0000 [IU]/h | INTRAMUSCULAR | Status: DC
  Administered 2013-01-19: 950 [IU]/h via INTRAVENOUS
  Filled 2013-01-18: qty 250

## 2013-01-18 MED ORDER — HEPARIN BOLUS VIA INFUSION
3000.0000 [IU] | Freq: Once | INTRAVENOUS | Status: AC
  Administered 2013-01-18: 3000 [IU] via INTRAVENOUS

## 2013-01-18 MED ORDER — INSULIN ASPART 100 UNIT/ML ~~LOC~~ SOLN
0.0000 [IU] | Freq: Three times a day (TID) | SUBCUTANEOUS | Status: DC
  Administered 2013-01-21 – 2013-01-26 (×6): 1 [IU] via SUBCUTANEOUS

## 2013-01-18 MED ORDER — HEPARIN SODIUM (PORCINE) 5000 UNIT/ML IJ SOLN: 60.0000 [IU]/kg | INTRAMUSCULAR | Status: DC

## 2013-01-18 MED ORDER — ONDANSETRON HCL 4 MG/2ML IJ SOLN: 4.0000 mg | Freq: Four times a day (QID) | INTRAMUSCULAR | Status: DC | PRN

## 2013-01-18 MED ORDER — ASPIRIN EC 81 MG PO TBEC
81.0000 mg | DELAYED_RELEASE_TABLET | Freq: Every day | ORAL | Status: DC
  Administered 2013-01-19 – 2013-01-26 (×7): 81 mg via ORAL
  Filled 2013-01-18 (×9): qty 1

## 2013-01-18 MED ORDER — NITROGLYCERIN 0.4 MG SL SUBL: 0.4000 mg | SUBLINGUAL_TABLET | SUBLINGUAL | Status: DC | PRN

## 2013-01-18 MED ORDER — INSULIN ASPART 100 UNIT/ML ~~LOC~~ SOLN
5.0000 [IU] | Freq: Once | SUBCUTANEOUS | Status: AC
  Administered 2013-01-18: 5 [IU] via INTRAVENOUS
  Filled 2013-01-18: qty 1

## 2013-01-18 MED ORDER — FUROSEMIDE 10 MG/ML IJ SOLN
40.0000 mg | Freq: Once | INTRAMUSCULAR | Status: AC
  Administered 2013-01-18: 40 mg via INTRAVENOUS
  Filled 2013-01-18: qty 4

## 2013-01-18 NOTE — Progress Notes (Signed)
ANTICOAGULATION CONSULT NOTE - Follow Up Consult   HL = 0.15 (goal 0.3 - 0.7 units/mL) Heparin dosing weight = 50 kg   Assessment: 35 YOF with history of CAD and HTN admitted with complaint of chest pain.  Patient started on IV heparin and heparin level is sub-therapeutic.  Noted report of scant bleeding in foley.   Plan: - Increase heparin gtt to 750 units/hr - Check 6 hr HL - Daily HL / CBC - F/U with bleeding     Kathryn Dixon, PharmD, BCPS Pager:  [phone] 01/18/2013, 7:51 PM

## 2013-01-18 NOTE — Progress Notes (Signed)
ANTICOAGULATION CONSULT NOTE - Initial Consult  Pharmacy Consult for heparin Indication: chest pain/ACS  No Known Allergies  Patient Measurements: Height: 5\' 5"  (165.1 cm) Weight: 110 lb (49.896 kg) IBW/kg (Calculated) : 57   Vital Signs: Temp: 99.9 F (37.7 C) (08/26 1203) Temp src: Oral (08/26 1203) BP: 115/79 mmHg (08/26 1203)  Labs: No results found for this basename: HGB, HCT, PLT, APTT, LABPROT, INR, HEPARINUNFRC, CREATININE, CKTOTAL, CKMB, TROPONINI,  in the last 72 hours  Estimated Creatinine Clearance: 53 ml/min (by C-G formula based on Cr of 0.46).   Medical History: Past Medical History  Diagnosis Date  . Hypertension   . Coronary artery disease     Assessment: 67 yo female here with SOB and concern for ACS (CODE STEMI cancelled) to start heparin. No heparin bolus has been given yet.  Goal of Therapy:  Heparin level 0.3-0.7 units/ml Monitor platelets by anticoagulation protocol: Yes   Plan:  -Heparin bolus 3000 units IV followed by 600 units/hr (~12 units/kg/hr) -Heparin level in 6 hours and daily wth CBC daily

## 2013-01-18 NOTE — ED Notes (Signed)
Pt is here with sob which began this am.  No CP with this.  No recent cough, cold or URI.  Pt has had 2 breathing treatments from ems en route (1st albuterol and 2nd duoneb).  Pt saw very little relief with this, pt arrives tachypneic with some accesory muscle use.  Pt speaking in short sentences

## 2013-01-18 NOTE — H&P (Signed)
Quintana Canelo is an 67 y.o. female.   Chief Complaint: SOB HPI:   The patient is a 67 yo female with a history of CAD with MI approximately 15-20 years ago, continued tobacco abuse, HTN.  She has not seen a cardiologist since.  She reports waking up at ~0600hrs with severe dyspnea.  Position did not make a difference.    She currently denies nausea, vomiting, fever, chest pain, orthopnea, dizziness, PND, cough, congestion, abdominal pain, hematochezia, melena, lower extremity edema.  She does state that her legs get swollen when she watches TV while sitting in a chair.  Oxygen and albuterol help with SOB.  We were asked to see her for possible STEMI.  She is tachycardic with LBBB morphology.  POC troponin was 0.10.      Past Medical History  Diagnosis Date  . Hypertension   . Coronary artery disease     Past Surgical History  Procedure Laterality Date  . Abdominal hysterectomy    . Fracture surgery      Family history:  No CAD/MI in mother or father  Social History:  reports that she has been smoking Cigarettes.  She has been smoking about 1.50 packs per day. She has never used smokeless tobacco. She reports that  drinks alcohol. She reports that she does not use illicit drugs.  Allergies: No Known Allergies  Prior to Admission medications   Not on File     (Not in a hospital admission)  Results for orders placed during the hospital encounter of 01/18/13 (from the past 48 hour(s))  POCT I-STAT TROPONIN I     Status: Abnormal   Collection Time    01/18/13 11:53 AM      Result Value Range   Troponin i, poc 0.10 (*) 0.00 - 0.08 ng/mL   Comment NOTIFIED PHYSICIAN     Comment 3            Comment: Due to the release kinetics of cTnI,     a negative result within the first hours     of the onset of symptoms does not rule out     myocardial infarction with certainty.     If myocardial infarction is still suspected,     repeat the test at appropriate intervals.   No results  found.  Review of Systems  Constitutional: Negative for fever and diaphoresis.  HENT: Negative for congestion, sore throat and neck pain.   Respiratory: Positive for shortness of breath. Negative for cough.   Cardiovascular: Negative for chest pain, palpitations, orthopnea, leg swelling and PND.  Gastrointestinal: Negative for nausea, vomiting, abdominal pain, blood in stool and melena.  Genitourinary: Negative for dysuria and hematuria.  Neurological: Negative for dizziness.  All other systems reviewed and are negative.    Blood pressure 115/79, temperature 99.9 F (37.7 C), temperature source Oral, resp. rate 25, height 5\' 5"  (1.651 m), weight 110 lb (49.896 kg), SpO2 99.00%. Physical Exam  Constitutional: She is oriented to person, place, and time. She appears well-developed and well-nourished. No distress.  On nonrebreather  HENT:  Head: Normocephalic.  Eyes: EOM are normal. Pupils are equal, round, and reactive to light.  Neck: Normal range of motion. Neck supple. No JVD present.  Cardiovascular: Normal rate, regular rhythm, S1 normal and S2 normal.   No murmur heard. Pulses:      Radial pulses are 2+ on the right side, and 2+ on the left side.       Dorsalis pedis pulses  are 2+ on the right side, and 2+ on the left side.  No carotid bruits.  Respiratory: She is in respiratory distress. She has wheezes.  GI: Soft. Bowel sounds are normal. She exhibits no distension. There is no tenderness.  Musculoskeletal: She exhibits no edema.  Neurological: She is oriented to person, place, and time. She exhibits normal muscle tone.  Skin: Skin is warm and dry.  Psychiatric: She has a normal mood and affect.     Assessment/Plan Active Problems:   LBBB (left bundle branch block)   Dyspnea   Tobacco abuse  Plan:  The patient will be admitted to step-down.  She has been started on IV heparin empirically.  Checking D dimer.  If positive will order CT angio of her chest.  Cycle  troponin.  Xopenex Nebulizer for wheezing.  Given her history, she will need an ischemic eval.  Glucose is also elevated.  Will check A1C.  2D echo.    HAGER, BRYAN 01/18/2013, 12:14 PM   Patient seen and examined. Agree with assessment and plan. Pt is a 67 yo AAF who reports having had an MI ~ 20 yrs ago. She recalls having had a cath but does not know what it showed. No f/u with cardiology. She does have a long standing tobacco history. No recent chest pain. Today she noted shortness of breath upon awakening. No associated CP, N, V or pain. Old ECG's have shown LBBB. ECG today shows LBBB with ST changes precordially and inferiorly probably contributed by LBBB. She feels markedly improved following ventolin earlier. Minimal Trop POC elevation at 0.1.  Will check D-Dimer and if positive Chest CT. Check 2 -d echo. Have cancelled STEMI protocol.   Lennette Bihari, MD, [facility] 01/18/2013 1:25 PM

## 2013-01-18 NOTE — ED Notes (Signed)
Pt complaining of pain with movement of foley tube, scant bleeding noted on tube, RN made aware.

## 2013-01-18 NOTE — ED Notes (Signed)
EMS attempted IV placement without success

## 2013-01-18 NOTE — ED Notes (Signed)
Results of i-stat troponin given to Dr. Deretha Emory

## 2013-01-18 NOTE — ED Notes (Signed)
Pt is having xray done  

## 2013-01-18 NOTE — ED Notes (Signed)
Dr. Tresa Endo is at the bedside to see pt.

## 2013-01-18 NOTE — ED Notes (Signed)
Report Attempted

## 2013-01-18 NOTE — ED Provider Notes (Addendum)
CSN: [id number]     Arrival date & time 01/18/13  1114 History   First MD Initiated Contact with Patient 01/18/13 1119     Chief Complaint  Patient presents with  . Shortness of Breath   (Consider location/radiation/quality/duration/timing/severity/associated sxs/prior Treatment) The history is provided by the patient, the EMS personnel and a relative.   67 year old female brought in by EMS. Call was for shortness of breath that began this morning not associated with chest pain patient states she felt fine last evening and yesterday. Patient has a history of hypertension and remote coronary artery disease. Has not seen cardiology a number of years. Followed by Dr. Ronne Binning as her primary care Dr. EMS treat her with albuterol Atrovent nebulizer without any improvement. Patient arrived at the kidney can with some accessory muscle use. Patient only able to speak in short sentences. Patient denied any chest pain or chest pressure.  Past Medical History  Diagnosis Date  . Hypertension   . Coronary artery disease    Past Surgical History  Procedure Laterality Date  . Abdominal hysterectomy    . Fracture surgery     No family history on file. History  Substance Use Topics  . Smoking status: Current Every Day Smoker -- 1.50 packs/day    Types: Cigarettes  . Smokeless tobacco: Never Used  . Alcohol Use: Yes     Comment: occ   OB History   Grav Para Term Preterm Abortions TAB SAB Ect Mult Living                 Review of Systems  Constitutional: Negative for fever.  HENT: Negative for congestion.   Eyes: Negative for visual disturbance.  Respiratory: Positive for shortness of breath and wheezing.   Cardiovascular: Positive for leg swelling. Negative for chest pain and palpitations.  Gastrointestinal: Negative for nausea, vomiting, abdominal pain and diarrhea.  Genitourinary: Negative for dysuria.  Musculoskeletal: Negative for back pain.  Skin: Negative for rash.  Neurological:  Negative for weakness and headaches.  Hematological: Does not bruise/bleed easily.  Psychiatric/Behavioral: Negative for confusion.    Allergies  Review of patient's allergies indicates no known allergies.  Home Medications  No current outpatient prescriptions on file. BP 107/76  Temp(Src) 99.9 F (37.7 C) (Oral)  Resp 25  Ht 5\' 5"  (1.651 m)  Wt 110 lb (49.896 kg)  BMI 18.31 kg/m2  SpO2 100% Physical Exam  Nursing note and vitals reviewed. Constitutional: She is oriented to person, place, and time. She appears well-developed and well-nourished. She appears distressed.  HENT:  Head: Normocephalic and atraumatic.  Mouth/Throat: Oropharynx is clear and moist.  Eyes: Conjunctivae and EOM are normal. Pupils are equal, round, and reactive to light.  Neck: Normal range of motion.  Cardiovascular: Regular rhythm and normal heart sounds.   No murmur heard. Tachycardic rate 122  Pulmonary/Chest: She is in respiratory distress. She has wheezes.  Abdominal: Soft. Bowel sounds are normal. There is no tenderness.  Musculoskeletal: Normal range of motion. She exhibits edema.  Neurological: She is alert and oriented to person, place, and time. No cranial nerve deficit. She exhibits normal muscle tone. Coordination normal.  Skin: Skin is warm. No rash noted.    ED Course  Procedures (including critical care time) Labs Review Labs Reviewed  BASIC METABOLIC PANEL - Abnormal; Notable for the following:    CO2 17 (*)    Glucose, Bld 288 (*)    GFR calc non Af Amer 88 (*)    All  other components within normal limits  CBC - Abnormal; Notable for the following:    RDW 19.2 (*)    All other components within normal limits  PRO B NATRIURETIC PEPTIDE - Abnormal; Notable for the following:    Pro B Natriuretic peptide (BNP) 7145.0 (*)    All other components within normal limits  POCT I-STAT TROPONIN I - Abnormal; Notable for the following:    Troponin i, poc 0.10 (*)    All other components  within normal limits  POCT I-STAT, CHEM 8 - Abnormal; Notable for the following:    Glucose, Bld 292 (*)    Hemoglobin 15.3 (*)    All other components within normal limits  PROTIME-INR  APTT  TROPONIN I  D-DIMER, QUANTITATIVE  HEPARIN LEVEL (UNFRACTIONATED)  URINALYSIS, ROUTINE W REFLEX MICROSCOPIC   Imaging Review Dg Chest Portable 1 View  01/18/2013   CLINICAL DATA:  Shortness of breath, smoker.  EXAM: PORTABLE CHEST - 1 VIEW  COMPARISON:  08/11/2010  FINDINGS: There is cardiomegaly. Diffuse interstitial and alveolar opacities throughout the lungs compatible with edema/ CHF. No visible effusions. No acute bony abnormality.  IMPRESSION: Mild to moderate CHF.   Electronically Signed   By: Charlett Nose   On: 01/18/2013 12:22   Results for orders placed during the hospital encounter of 01/18/13  BASIC METABOLIC PANEL      Result Value Range   Sodium 138  135 - 145 mEq/L   Potassium 3.7  3.5 - 5.1 mEq/L   Chloride 105  96 - 112 mEq/L   CO2 17 (*) 19 - 32 mEq/L   Glucose, Bld 288 (*) 70 - 99 mg/dL   BUN 16  6 - 23 mg/dL   Creatinine, Ser 5.40  0.50 - 1.10 mg/dL   Calcium 9.3  8.4 - 98.1 mg/dL   GFR calc non Af Amer 88 (*) >90 mL/min   GFR calc Af Amer >90  >90 mL/min  CBC      Result Value Range   WBC 9.6  4.0 - 10.5 K/uL   RBC 4.61  3.87 - 5.11 MIL/uL   Hemoglobin 12.7  12.0 - 15.0 g/dL   HCT 19.1  47.8 - 29.5 %   MCV 82.2  78.0 - 100.0 fL   MCH 27.5  26.0 - 34.0 pg   MCHC 33.5  30.0 - 36.0 g/dL   RDW 62.1 (*) 30.8 - 65.7 %   Platelets 164  150 - 400 K/uL  PRO B NATRIURETIC PEPTIDE      Result Value Range   Pro B Natriuretic peptide (BNP) 7145.0 (*) 0 - 125 pg/mL  PROTIME-INR      Result Value Range   Prothrombin Time 14.0  11.6 - 15.2 seconds   INR 1.10  0.00 - 1.49  APTT      Result Value Range   aPTT 28  24 - 37 seconds  TROPONIN I      Result Value Range   Troponin I <0.30  <0.30 ng/mL  POCT I-STAT TROPONIN I      Result Value Range   Troponin i, poc 0.10 (*)  0.00 - 0.08 ng/mL   Comment NOTIFIED PHYSICIAN     Comment 3           POCT I-STAT, CHEM 8      Result Value Range   Sodium 140  135 - 145 mEq/L   Potassium 3.8  3.5 - 5.1 mEq/L   Chloride 109  96 -  112 mEq/L   BUN 17  6 - 23 mg/dL   Creatinine, Ser 8.29  0.50 - 1.10 mg/dL   Glucose, Bld 562 (*) 70 - 99 mg/dL   Calcium, Ion 1.30  8.65 - 1.30 mmol/L   TCO2 19  0 - 100 mmol/L   Hemoglobin 15.3 (*) 12.0 - 15.0 g/dL   HCT 78.4  69.6 - 29.5 %     Date: 01/18/2013  Rate: 142  Rhythm: sinus tachycardia  QRS Axis: left  Intervals: normal  ST/T Wave abnormalities: ST elevations inferiorly  Conduction Disutrbances:left bundle branch block  Narrative Interpretation:   Old EKG Reviewed: changes noted     CRITICAL CARE Performed by: Shelda Jakes. Total critical care time: 30  Critical care time was exclusive of separately billable procedures and treating other patients. Critical care was necessary to treat or prevent imminent or life-threatening deterioration. Critical care was time spent personally by me on the following activities: development of treatment plan with patient and/or surrogate as well as nursing, discussions with consultants, evaluation of patient's response to treatment, examination of patient, obtaining history from patient or surrogate, ordering and performing treatments and interventions, ordering and review of laboratory studies, ordering and review of radiographic studies, pulse oximetry and re-evaluation of patient's condition.   MDM   1. CHF (congestive heart failure)   2. LBBB (left bundle branch block)   3. Dyspnea   4. Hyperglycemia    The patient brought in by EMS for acute onset of shortness of breath this started this morning. Patient felt fine yesterday no chest pain or chest discomfort associated with this. No history of pulmonary problems in the past. Patient's EKG in the field showed a left bundle branch block some concern for inferior MI. EKG  here in the emergency part showed left bundle branch block but worsening of the ST segment elevation and Q waves inferiorly compared to 2012. Patient clinically not consistent with a STEMI however based on EKG it was called to be on the safe side cardiology involved they reviewed the EKG swallows predominately left bundle branch block. Initial point her troponin was elevated and regular troponin was negative chest x-ray did go on to show new onset CHF patient is not a history that the past. Patient also hyperglycemic probably has undiagnosed diabetes. Patient will be admitted by cardiology. Seen by Dr. Tresa Endo and they will do the admission. For the CHF the patient was treated with IV Lasix patient was not started on nitroglycerin because by that point in time her blood pressures were like 110 -107 systolic.  Initially patient was started on the STEMI protocol orders. Was given aspirin heparin was not initiated but was ready to go. Patient felt a lot improved on oxygen was on nonrebreather. Patient also received by EMS albuterol and Atrovent which made no difference in her shortness of breath. Patient had no significant swelling in her lower come is but did have a little bit trace swelling. Chest x-ray was not consistent with a pneumonia as stated above EKG was concerning for inferior MI death and has left bundle branch block which is old. And the negative regular troponin for now rules out an acute MI.  Patient upon arrival was definitely in respiratory distress. Eventually improved 100% nonrebreather patient was unable to talk in short sentences and was using accessory muscles to breathe.  Shelda Jakes, MD 01/18/13 2841  Shelda Jakes, MD 01/18/13 [phone]

## 2013-01-19 ENCOUNTER — Inpatient Hospital Stay (HOSPITAL_COMMUNITY): Payer: Medicare Other

## 2013-01-19 ENCOUNTER — Encounter (HOSPITAL_COMMUNITY): Payer: Self-pay | Admitting: Radiology

## 2013-01-19 DIAGNOSIS — I729 Aneurysm of unspecified site: Secondary | ICD-10-CM | POA: Diagnosis present

## 2013-01-19 DIAGNOSIS — I5021 Acute systolic (congestive) heart failure: Secondary | ICD-10-CM

## 2013-01-19 DIAGNOSIS — I214 Non-ST elevation (NSTEMI) myocardial infarction: Secondary | ICD-10-CM | POA: Diagnosis present

## 2013-01-19 DIAGNOSIS — I712 Thoracic aortic aneurysm, without rupture: Secondary | ICD-10-CM

## 2013-01-19 DIAGNOSIS — I319 Disease of pericardium, unspecified: Secondary | ICD-10-CM

## 2013-01-19 DIAGNOSIS — E876 Hypokalemia: Secondary | ICD-10-CM | POA: Diagnosis not present

## 2013-01-19 LAB — GLUCOSE, CAPILLARY
Glucose-Capillary: 86 mg/dL (ref 70–99)
Glucose-Capillary: 89 mg/dL (ref 70–99)

## 2013-01-19 LAB — TSH: TSH: 0.823 u[IU]/mL (ref 0.350–4.500)

## 2013-01-19 LAB — LIPID PANEL
LDL Cholesterol: 103 mg/dL — ABNORMAL HIGH (ref 0–99)
Triglycerides: 57 mg/dL (ref <150)

## 2013-01-19 LAB — HEMOGLOBIN A1C: Hgb A1c MFr Bld: 5.6 % (ref <5.7)

## 2013-01-19 LAB — BASIC METABOLIC PANEL
GFR calc Af Amer: >90 mL/min (ref >90)
GFR calc non Af Amer: 89 mL/min — ABNORMAL LOW (ref >90)
Potassium: 3.3 mEq/L — ABNORMAL LOW (ref 3.5–5.1)
Sodium: 140 mEq/L (ref 135–145)

## 2013-01-19 LAB — TROPONIN I
Troponin I: 3.3 ng/mL (ref <0.30)
Troponin I: 1.99 ng/mL (ref <0.30)

## 2013-01-19 LAB — CBC
Hemoglobin: 11.5 g/dL — ABNORMAL LOW (ref 12.0–15.0)
RBC: 4.28 MIL/uL (ref 3.87–5.11)

## 2013-01-19 LAB — HEPARIN LEVEL (UNFRACTIONATED): Heparin Unfractionated: 0.37 IU/mL (ref 0.30–0.70)

## 2013-01-19 MED ORDER — POTASSIUM CHLORIDE CRYS ER 20 MEQ PO TBCR
40.0000 meq | EXTENDED_RELEASE_TABLET | Freq: Once | ORAL | Status: AC
  Administered 2013-01-19: 40 meq via ORAL
  Filled 2013-01-19: qty 2

## 2013-01-19 MED ORDER — IOHEXOL 350 MG/ML SOLN
100.0000 mL | Freq: Once | INTRAVENOUS | Status: AC | PRN
  Administered 2013-01-19: 100 mL via INTRAVENOUS

## 2013-01-19 MED ORDER — FUROSEMIDE 10 MG/ML IJ SOLN
INTRAMUSCULAR | Status: AC
  Administered 2013-01-19: 20 mg via INTRAVENOUS
  Filled 2013-01-19: qty 4

## 2013-01-19 MED ORDER — HEPARIN BOLUS VIA INFUSION
1500.0000 [IU] | Freq: Once | INTRAVENOUS | Status: AC
  Administered 2013-01-19: 1500 [IU] via INTRAVENOUS
  Filled 2013-01-19: qty 1500

## 2013-01-19 MED ORDER — FUROSEMIDE 10 MG/ML IJ SOLN
20.0000 mg | Freq: Once | INTRAMUSCULAR | Status: AC
  Administered 2013-01-19: 20 mg via INTRAVENOUS

## 2013-01-19 NOTE — Progress Notes (Signed)
ANTICOAGULATION CONSULT NOTE - Follow Up Consult    HL = 0.4 (goal 0.3 - 0.7 units/mL) Heparin dosing weight = 55 kg   Assessment: 67 YOF on IV heparin for ACS and mural thrombus seen on CT.  Cardiac cath is delayed.  Heparin level is therapeutic; no bleeding reported.   Plan: - Continue heparin gtt at 950 units/hr - Daily HL / CBC - F/U with plans    Treniya Lobb D. Laney Potash, PharmD, BCPS Pager:  [phone] 01/19/2013, 6:51 PM

## 2013-01-19 NOTE — Progress Notes (Signed)
Echocardiogram 2D Echocardiogram has been performed.  Dorothey Baseman 01/19/2013, 12:11 PM

## 2013-01-19 NOTE — Progress Notes (Signed)
ANTICOAGULATION CONSULT NOTE  Pharmacy Consult for heparin Indication: chest pain/ACS  No Known Allergies  Patient Measurements: Height: 5\' 4"  (162.6 cm) Weight: 120 lb 2.4 oz (54.5 kg) IBW/kg (Calculated) : 54.7   Vital Signs: Temp: 97.9 F (36.6 C) (08/27 0420) Temp src: Oral (08/27 0420) BP: 116/71 mmHg (08/27 0420) Pulse Rate: 74 (08/27 0420)  Labs:  Recent Labs  01/18/13 1136 01/18/13 1216 01/18/13 1218 01/18/13 1239 01/18/13 1815 01/18/13 2230 01/19/13 0230 01/19/13 0300  HGB 12.7  --  15.3*  --   --   --   --  11.5*  HCT 37.9  --  45.0  --   --   --   --  34.9*  PLT 164  --   --   --   --   --   --  130*  APTT  --  28  --   --   --   --   --   --   LABPROT  --  14.0  --   --   --   --   --   --   INR  --  1.10  --   --   --   --   --   --   HEPARINUNFRC  --   --   --  <0.10* 0.15*  --  0.16*  --   CREATININE 0.67  --  0.70  --   --   --   --   --   TROPONINI  --  <0.30  --   --   --  3.30*  --   --     Estimated Creatinine Clearance: 57.9 ml/min (by C-G formula based on Cr of 0.7).  Assessment: 67 yo female with chest pain and elevated cardiac enzymes for heparin  Goal of Therapy:  Heparin level 0.3-0.7 units/ml Monitor platelets by anticoagulation protocol: Yes   Plan:  Heparin 1500 units IV bolus, then increase heparin 950 units/hr Check heparin level in 6 hours.  Geannie Risen, PharmD, BCPS

## 2013-01-19 NOTE — Progress Notes (Signed)
Pt. Seen and examined. Agree with the NP/PA-C note as written. Unfortunate 67 yo female who presented with dyspnea, but no chest pain. She says this has resolved on heparin. Cardiac enzymes indicate a NSTEMI. Troponin peaked at 3.46 and is trending down. She had a CTPA which did not show PE, despite an elevated d-dimer of 11.61. An aortic aneurysm was demonstrated with mural thrombus noted in the distal aspect of the thoracic aorta. She underwent a 2D echo today, which demonstrates an EF of 20-25%, inferior hypokinesis and diffuse hypokinesis. There is probably apical thrombus and a question of apical aneurysm - additionally, there is color flow seen in the distal aspect of the aortic arch, which may be a small aortic rupture or dissection with associated thrombus.  I shared the results with her and feel we should wait for LHC until the aortic pathology is more clearly sorted out. I will contact thoracic surgery for their timely input. I have scheduled a STAT CT aortogram. I thoroughly discussed this with the patient and we will follow closely along with her.  Chrystie Nose, MD, [facility] Attending Cardiologist [facility]

## 2013-01-19 NOTE — Progress Notes (Signed)
Subjective: No further SOB, no chest pain, never had chest pain.   Objective: Vital signs in last 24 hours: Temp:  [97.5 F (36.4 C)-99.2 F (37.3 C)] 97.5 F (36.4 C) (08/27 0746) Pulse Rate:  [67-105] 67 (08/27 0746) Resp:  [10-25] 19 (08/27 0746) BP: (96-124)/(58-82) 101/62 mmHg (08/27 0746) SpO2:  [95 %-100 %] 99 % (08/27 0746) Weight:  [120 lb 2.4 oz (54.5 kg)] 120 lb 2.4 oz (54.5 kg) (08/27 0420) Weight change:  Last BM Date: 01/18/13 Intake/Output from previous day: -1816 08/26 0701 - 08/27 0700 In: 83.2 [I.V.:83.2] Out: 2000 [Urine:2000] Intake/Output this shift:    PE: General:Pleasant affect, NAD Skin:Warm and dry, brisk capillary refill HEENT:normocephalic, sclera clear, mucus membranes moist Neck:supple, no JVD, no bruits  Heart:S1S2 RRR without murmur, gallup, rub or click Lungs:decreased breath sounds without rales, rhonchi, or wheezes ZOX:WRUE, non tender, + BS, do not palpate liver spleen or masses Ext:no lower ext edema, 2+ pedal pulses, 2+ radial pulses Neuro:alert and oriented, MAE, follows commands, + facial symmetry   Lab Results:  Recent Labs  01/18/13 1136 01/18/13 1218 01/19/13 0300  WBC 9.6  --  9.9  HGB 12.7 15.3* 11.5*  HCT 37.9 45.0 34.9*  PLT 164  --  130*   BMET  Recent Labs  01/18/13 1136 01/18/13 1218 01/19/13 0300  NA 138 140 140  K 3.7 3.8 3.3*  CL 105 109 106  CO2 17*  --  24  GLUCOSE 288* 292* 85  BUN 16 17 12   CREATININE 0.67 0.70 0.65  CALCIUM 9.3  --  9.2    Recent Labs  01/18/13 2230 01/19/13 0300  TROPONINI 3.30* 3.46*    Lab Results  Component Value Date   CHOL 169 01/19/2013   HDL 55 01/19/2013   LDLCALC 103* 01/19/2013   TRIG 57 01/19/2013   CHOLHDL 3.1 01/19/2013   No results found for this basename: HGBA1C     Lab Results  Component Value Date   TSH 0.823 01/18/2013    Hepatic Function Panel No results found for this basename: PROT, ALBUMIN, AST, ALT, ALKPHOS, BILITOT, BILIDIR, IBILI,   in the last 72 hours  Recent Labs  01/19/13 0300  CHOL 169   No results found for this basename: PROTIME,  in the last 72 hours      Studies/Results: Ct Angio Chest Pe W/cm &/or Wo Cm  01/18/2013   *RADIOLOGY REPORT*  Clinical Data: Elevated D-dimer, shortness of breath, chest pain, evaluate for pulmonary embolism  CT ANGIOGRAPHY CHEST  Technique:  Multidetector CT imaging of the chest using the standard protocol during bolus administration of intravenous contrast. Multiplanar reconstructed images including MIPs were obtained and reviewed to evaluate the vascular anatomy.  Contrast: OMNIPAQUE IOHEXOL 350 MG/ML SOLN  Comparison: Chest radiograph - earlier same day  Vascular Findings:  There is adequate opacification of the pulmonary arterial system of the main pulmonary artery measuring 517 HU. There is a small nonocclusive bleb within an anterior segmental branch of the left upper lobe (image 102, series 5, coronal image 40 dB).  Otherwise, there are no discrete filling defects within the pulmonary arterial tree.  The caliber of the main pulmonary artery is enlarged measuring 34 mm in diameter.  Marked cardiomegaly, in particular, there is marked enlargement of the left ventricle.  Coronary artery calcifications.  Trace amount of presumably physiologic pericardial fluid.  While this examination was not tailored for evaluation of the thoracic aorta, there is fusiform  aneurysmal dilatation of the ascending thoracic aorta measuring 41 mm in greatest oblique axial dimension (image 68, series four).  The thoracic aorta remains ectatic proximally measuring approximately 37 mm at the level of the aortic arch (image 43, series four).  There is a minimal amount of apparent crescentic mural thrombus within the cranial aspect of the descending thoracic aorta (image 55, series four).  While mid aspect of the descending thoracic aorta tapers to a near normal caliber (measuring approximately 30 mm in diameter  - image 82, series four), the distal aspect of the descending thoracic aorta is tortuous and aneurysmal measuring approximately 43 mm at the level of the diaphragmatic hiatus.  There is apparent crescentic mural thrombus within the aneurysmal descending thoracic aorta (image 126, series four).  No definite periaortic stranding.  ---------------------------------------------------------  Nonvascular findings:  Small to moderate-sized bilateral pleural effusions with associated adjacent compressive atelectasis, right greater than left.  There is rather diffuse mild interlobular septal thickening and ground glass opacification suggestive of pulmonary edema.  Mild centrilobular paraseptal emphysematous change is suspected.  There is a minimal amount of debris within distal aspect of the trachea extending to the right main stem bronchus.  The central pulmonary airways remain patent.  Scattered shoddy mediastinal lymph nodes are borderline enlarged with index precarinal node conglomeration measuring 1.1 cm in short axis diameter (image 56, series 4 and index prevascular node measuring 1.1 cm.  No definite hilar or axillary lymphadenopathy.  Limited early arterial phase evaluation of the upper abdomen demonstrates a left sided nephrolithiasis with prominent stone measuring 3.6 mm (image 132).  Incidental note of a small splenule.  No acute or aggressive osseous abnormalities.  Dystrophic calcifications within the right lobe of the thyroid.  IMPRESSION:  1.  No acute pulmonary embolism.  Tiny nonocclusive pulmonary arterial web incidentally noted within an anterior segmental branch of the left upper lobe, likely the sequela of remote/prior pulmonary embolism.  2.  Findings suggestive of pulmonary edema with small bilateral pleural effusions and associated adjacent compressive atelectasis, right greater than left.  3.  Cardiomegaly.  Coronary calcifications. Enlarged caliber of the main pulmonary artery, nonspecific though  may be seen in the setting of pulmonary arterial hypertension. Further evaluation with cardiac echo may be performed if clinically indicated.  4.  While this examination was not tailored for evaluation of the thoracic aorta, note is made of aneurysmal dilatation of the ascending thoracic aorta measuring approximately 41 mm extending through the aortic arch and proximal descending thoracic aorta. While the mid aspect of the thoracic spine tapers to a near normal caliber, the distal aspect of the descending thoracic aorta is tortuous and aneurysmal measuring 43 mm in diameter and containing mural thrombus.  Non emergent referral to the thoracic surgery and dedicated TAA CT is recommended.  5.  Incidentally noted left-sided nephrolithiasis.  7.  Dystrophic calcifications in the right lobe of the thyroid. Further evaluation with dedicated thyroid ultrasound may be performed as clinically indicated.   Original Report Authenticated By: Tacey Ruiz, MD   Dg Chest Portable 1 View  01/18/2013   CLINICAL DATA:  Shortness of breath, smoker.  EXAM: PORTABLE CHEST - 1 VIEW  COMPARISON:  08/11/2010  FINDINGS: There is cardiomegaly. Diffuse interstitial and alveolar opacities throughout the lungs compatible with edema/ CHF. No visible effusions. No acute bony abnormality.  IMPRESSION: Mild to moderate CHF.   Electronically Signed   By: Charlett Nose   On: 01/18/2013 12:22    Medications: I  have reviewed the patient's current medications. Scheduled Meds: . aspirin EC  81 mg Oral Daily  . atorvastatin  10 mg Oral q1800  . insulin aspart  0-9 Units Subcutaneous TID WC  . potassium chloride  40 mEq Oral Once   Continuous Infusions: . sodium chloride Stopped (01/18/13 1256)  . sodium chloride 50 mL/hr at 01/19/13 0806  . heparin 950 Units/hr (01/19/13 0459)   PRN Meds:.acetaminophen, nitroGLYCERIN, ondansetron (ZOFRAN) IV  Assessment/Plan: Principal Problem:   Dyspnea, acute Active Problems:   LBBB (left bundle  branch block)   Tobacco abuse   NSTEMI (non-ST elevated myocardial infarction)   CAD in native artery, wth hx MI 15 yrs ago   Aneurysmal dilatation both ascending and descending thoracic aorta wth mural thrombus   Hyperglycemia   Hypokalemia   Acute CHF, awaiting echo  PLAN:  CT of chest aneurysmal dilatation of the ascending thoracic aorta measuring approximately 41 mm extending through the aortic arch and proximal descending thoracic aorta. While the mid aspect of the thoracic spine tapers to a near normal caliber, the distal aspect of the descending thoracic aorta is tortuous and aneurysmal measuring 43 mm in diameter and containing mural thrombus.   Troponin is positive.  Hx of CAD with MI 15 yrs ago.  On IV heparin  U/A with large leukocytes, mod bld, neg nitrites, culture pending.  Rec'd one dose of Lasix yesterday of 40 mg IV is now ->1 L  MD to see for possible cath today or tomorrow, having echo now. Currently made NPO but she did have BK   HgbA1C pending.   On SSI will need diabetic diet once able to eat.   Will give another dose of lasix once she has rec'd the Kdur  LOS: 1 day   Time spent with pt. :15 minutes. [facility] R  Nurse Practitioner Certified Pager [phone] 01/19/2013, 12:11 PM

## 2013-01-19 NOTE — Progress Notes (Signed)
Utilization Review Completed.Kathryn Dixon T8/27/2014  

## 2013-01-19 NOTE — Progress Notes (Signed)
crCRITICAL VALUE ALERT  Critical value received:  Troponin 3.30  Date of notification:  01/19/2013  Time of notification:  0005  Critical value read back:yes  Nurse who received alert:  Wilmon Pali  MD notified (1st page):  Dr.Balfour  Time of first page:  0011  MD notified (2nd page):  Time of second page:  Responding MD:  Dr.Balfour  Time MD responded:  [phone]

## 2013-01-19 NOTE — Progress Notes (Signed)
ANTICOAGULATION CONSULT NOTE   Pharmacy Consult for heparin Indication: chest pain/ACS  No Known Allergies  Patient Measurements: Height: 5\' 4"  (162.6 cm) Weight: 120 lb 2.4 oz (54.5 kg) IBW/kg (Calculated) : 54.7  Vital Signs: Temp: 97.5 F (36.4 C) (08/27 1213) Temp src: Oral (08/27 1213) BP: 120/57 mmHg (08/27 1213) Pulse Rate: 66 (08/27 1213)  Labs:  Recent Labs  01/18/13 1136 01/18/13 1216 01/18/13 1218  01/18/13 1815 01/18/13 2230 01/19/13 0230 01/19/13 0300 01/19/13 1230  HGB 12.7  --  15.3*  --   --   --   --  11.5*  --   HCT 37.9  --  45.0  --   --   --   --  34.9*  --   PLT 164  --   --   --   --   --   --  130*  --   APTT  --  28  --   --   --   --   --   --   --   LABPROT  --  14.0  --   --   --   --   --   --   --   INR  --  1.10  --   --   --   --   --   --   --   HEPARINUNFRC  --   --   --   < > 0.15*  --  0.16*  --  0.37  CREATININE 0.67  --  0.70  --   --   --   --  0.65  --   TROPONINI  --  <0.30  --   --   --  3.30*  --  3.46*  --   < > = values in this interval not displayed.  Estimated Creatinine Clearance: 57.9 ml/min (by C-G formula based on Cr of 0.65).  Assessment: 67 yo female with chest pain and elevated cardiac enzymes continues on heparin gtt. Heparin level is now therapeutic at 0.37. No bleeding noted. Of note, CT is negative for acute PE but mural thrombus was discovered.   Goal of Therapy:  Heparin level 0.3-0.7 units/ml Monitor platelets by anticoagulation protocol: Yes   Plan:  1. Continue heparin drip at 950 units/hr 2. Check a 6 hour heparin level to confirm 3. F/u cath plans  Lysle Pearl, PharmD, BCPS Pager # [phone] 01/19/2013 1:42 PM

## 2013-01-20 ENCOUNTER — Encounter (HOSPITAL_COMMUNITY)
Admission: EM | Disposition: A | Discharge status: Home / Self Care | Payer: Self-pay | Source: Home / Self Care | Attending: Cardiovascular Disease

## 2013-01-20 DIAGNOSIS — I712 Thoracic aortic aneurysm, without rupture: Secondary | ICD-10-CM

## 2013-01-20 DIAGNOSIS — I2510 Atherosclerotic heart disease of native coronary artery without angina pectoris: Secondary | ICD-10-CM

## 2013-01-20 HISTORY — PX: RIGHT HEART CATHETERIZATION: SHX5447

## 2013-01-20 HISTORY — PX: LEFT HEART CATHETERIZATION WITH CORONARY ANGIOGRAM: SHX5451

## 2013-01-20 LAB — URINE CULTURE: Colony Count: >=100000

## 2013-01-20 LAB — POCT I-STAT 3, ART BLOOD GAS (G3+)
Acid-base deficit: 5 mmol/L — ABNORMAL HIGH (ref 0.0–2.0)
Acid-base deficit: 5 mmol/L — ABNORMAL HIGH (ref 0.0–2.0)
O2 Saturation: 78 %
TCO2: 20 mmol/L (ref 0–100)

## 2013-01-20 LAB — GLUCOSE, CAPILLARY
Glucose-Capillary: 78 mg/dL (ref 70–99)
Glucose-Capillary: 156 mg/dL — ABNORMAL HIGH (ref 70–99)

## 2013-01-20 LAB — POCT I-STAT 3, VENOUS BLOOD GAS (G3P V)
Acid-base deficit: 4 mmol/L — ABNORMAL HIGH (ref 0.0–2.0)
O2 Saturation: 36 %
TCO2: 22 mmol/L (ref 0–100)

## 2013-01-20 LAB — HEPATIC FUNCTION PANEL
AST: 23 U/L (ref 0–37)
Alkaline Phosphatase: 77 U/L (ref 39–117)
Bilirubin, Direct: 0.2 mg/dL (ref 0.0–0.3)
Total Bilirubin: 0.7 mg/dL (ref 0.3–1.2)

## 2013-01-20 LAB — CBC
HCT: 32.6 % — ABNORMAL LOW (ref 36.0–46.0)
Hemoglobin: 10.8 g/dL — ABNORMAL LOW (ref 12.0–15.0)
MCH: 27.1 pg (ref 26.0–34.0)
MCHC: 33.1 g/dL (ref 30.0–36.0)
MCV: 81.9 fL (ref 78.0–100.0)
Platelets: 140 10*3/uL — ABNORMAL LOW (ref 150–400)
RBC: 3.98 MIL/uL (ref 3.87–5.11)
RDW: 19 % — ABNORMAL HIGH (ref 11.5–15.5)
WBC: 5.8 10*3/uL (ref 4.0–10.5)

## 2013-01-20 LAB — BASIC METABOLIC PANEL
BUN: 10 mg/dL (ref 6–23)
CO2: 24 mEq/L (ref 19–32)
Calcium: 9.3 mg/dL (ref 8.4–10.5)
Chloride: 106 mEq/L (ref 96–112)
Creatinine, Ser: 0.74 mg/dL (ref 0.50–1.10)
GFR calc Af Amer: >90 mL/min (ref >90)
GFR calc non Af Amer: 85 mL/min — ABNORMAL LOW (ref >90)
Glucose, Bld: 75 mg/dL (ref 70–99)
Potassium: 3.7 mEq/L (ref 3.5–5.1)
Sodium: 141 mEq/L (ref 135–145)

## 2013-01-20 SURGERY — LEFT HEART CATHETERIZATION WITH CORONARY ANGIOGRAM
Anesthesia: Moderate Sedation | Laterality: Left

## 2013-01-20 SURGERY — LEFT HEART CATHETERIZATION WITH CORONARY ANGIOGRAM
Anesthesia: LOCAL

## 2013-01-20 MED ORDER — SODIUM CHLORIDE 0.9 % IJ SOLN
3.0000 mL | Freq: Two times a day (BID) | INTRAMUSCULAR | Status: DC
  Administered 2013-01-20 – 2013-01-22 (×4): 3 mL via INTRAVENOUS
  Administered 2013-01-23: 09:00:00 via INTRAVENOUS
  Administered 2013-01-25: 3 mL via INTRAVENOUS

## 2013-01-20 MED ORDER — SODIUM CHLORIDE 0.9 % IV SOLN: 1.0000 mL/kg/h | INTRAVENOUS | Status: DC

## 2013-01-20 MED ORDER — LOSARTAN POTASSIUM 25 MG PO TABS
25.0000 mg | ORAL_TABLET | Freq: Every day | ORAL | Status: DC
  Administered 2013-01-20 – 2013-01-26 (×7): 25 mg via ORAL
  Filled 2013-01-20 (×8): qty 1

## 2013-01-20 MED ORDER — ASPIRIN 81 MG PO CHEW: 324.0000 mg | CHEWABLE_TABLET | ORAL | Status: DC

## 2013-01-20 MED ORDER — SODIUM CHLORIDE 0.9 % IV SOLN: 250.0000 mL | INTRAVENOUS | Status: DC | PRN

## 2013-01-20 MED ORDER — FENTANYL CITRATE 0.05 MG/ML IJ SOLN
INTRAMUSCULAR | Status: AC
  Filled 2013-01-20: qty 2

## 2013-01-20 MED ORDER — SODIUM CHLORIDE 0.9 % IJ SOLN: 3.0000 mL | INTRAMUSCULAR | Status: DC | PRN

## 2013-01-20 MED ORDER — SODIUM CHLORIDE 0.9 % IV SOLN
1.0000 mL/kg/h | INTRAVENOUS | Status: AC
  Administered 2013-01-20: 1 mL/kg/h via INTRAVENOUS

## 2013-01-20 MED ORDER — MORPHINE SULFATE 2 MG/ML IJ SOLN: 1.0000 mg | INTRAMUSCULAR | Status: DC | PRN

## 2013-01-20 MED ORDER — NITROGLYCERIN 0.2 MG/ML ON CALL CATH LAB
INTRAVENOUS | Status: AC
  Filled 2013-01-20: qty 1

## 2013-01-20 MED ORDER — SODIUM CHLORIDE 0.9 % IJ SOLN
3.0000 mL | Freq: Two times a day (BID) | INTRAMUSCULAR | Status: DC
  Administered 2013-01-20: 3 mL via INTRAVENOUS

## 2013-01-20 MED ORDER — HEPARIN (PORCINE) IN NACL 100-0.45 UNIT/ML-% IJ SOLN
1000.0000 [IU]/h | INTRAMUSCULAR | Status: DC
  Administered 2013-01-20: 1000 [IU]/h via INTRAVENOUS
  Administered 2013-01-21 – 2013-01-23 (×3): 1100 [IU]/h via INTRAVENOUS
  Administered 2013-01-25 – 2013-01-26 (×2): 1000 [IU]/h via INTRAVENOUS
  Filled 2013-01-20 (×8): qty 250

## 2013-01-20 MED ORDER — ASPIRIN 81 MG PO CHEW
324.0000 mg | CHEWABLE_TABLET | Freq: Once | ORAL | Status: AC
  Administered 2013-01-20: 324 mg via ORAL
  Filled 2013-01-20: qty 4

## 2013-01-20 MED ORDER — HEPARIN (PORCINE) IN NACL 2-0.9 UNIT/ML-% IJ SOLN
INTRAMUSCULAR | Status: AC
  Filled 2013-01-20: qty 1000

## 2013-01-20 MED ORDER — LIDOCAINE HCL (PF) 1 % IJ SOLN
INTRAMUSCULAR | Status: AC
  Filled 2013-01-20: qty 30

## 2013-01-20 MED ORDER — FUROSEMIDE 10 MG/ML IJ SOLN
INTRAMUSCULAR | Status: AC
  Administered 2013-01-20: 40 mg via INTRAVENOUS
  Filled 2013-01-20: qty 4

## 2013-01-20 MED ORDER — FUROSEMIDE 10 MG/ML IJ SOLN
40.0000 mg | Freq: Two times a day (BID) | INTRAMUSCULAR | Status: DC
  Administered 2013-01-20 – 2013-01-22 (×4): 40 mg via INTRAVENOUS
  Filled 2013-01-20 (×6): qty 4

## 2013-01-20 NOTE — Interval H&P Note (Signed)
History and Physical Interval Note:  01/20/2013 12:38 PM  Kathryn Dixon  has presented today for surgery, with the diagnosis of NSTEMI with new Dx of Cardiomyopathy - presumed to be Ischemic.  She was seen by Dr. Rennis Golden today , cath yesterday postponed due to concern for possible Aortic Dissection - CT scan reviewed. The various methods of treatment have been discussed with the patient and family. After consideration of risks, benefits and other options for treatment, the patient has consented to  Procedure(s): LEFT HEART CATHETERIZATION WITH CORONARY ANGIOGRAM (N/A) as a surgical intervention .  The patient's history has been reviewed, patient examined, no change in status, stable for surgery.  I have reviewed the patient's chart and labs.  Questions were answered to the patient's satisfaction.     HARDING,DAVID W

## 2013-01-20 NOTE — CV Procedure (Signed)
CARDIAC CATHETERIZATION REPORT  NAME:  Arieal Cuoco   MRN: [id number] DOB:  Dec 27, 1945   ADMIT DATE: 01/18/2013 Procedure Date: 01/20/2013  INTERVENTIONAL CARDIOLOGIST: Marykay Lex, M.D., MS PRIMARY CARE PROVIDER: Billee Cashing, MD PRIMARY CARDIOLOGIST: Hilty, Kenneth C. ([place]), M.D. / Lennette Bihari, M.D.  PATIENT:  Kathryn Dixon is a 67 y.o. female a long-standing history of diabetes, chronic smoker, and hypertension, who roughly 20 years ago was told she had an MI and coronary artery disease.  She was lost to followup and was not treated with anything specific.  She presented to the [facility] emergency room with profound dyspnea and chest pressure.  She denied PND orthopnea, or lower extremity edema except for when sitting in a chair for longer to time.  Emergency room she was helped with oxygen albuterol.  Do to the ECG showing Left Bundle Branch Block  (LBBB), we were asked to evaluate her and emergency room for possible code STEMI that was canceled as the LBBB is not new.  She did rule in for non-ST elevation MI with troponins at 3.4 range.  Initial plans for heart catheterization yesterday were postponed do to CT scan findings of an aortic aneurysm of possible abdominal aortic mural thrombus.  There is question of possible dissection.  A full CTA of the aorta was obtained which did not show evidence of dissection.  Echocardiographic evaluation was performed demonstrating a severely decreased ejection fraction of 20-25% with inferior akinesis consistent with old inferior MI. Based on these findings, and the safe findings on the CT scan she is referred now for invasive evaluation.  With a low EF the plan is to proceed with right and left heart catheterization.  PRE-OPERATIVE DIAGNOSIS:    Non-STEMI  New diagnosis of expected Ischemic Cardiomyopathy  None thoracic to aortic aneurysm with mural thrombus in the abdominal portion  PROCEDURES PERFORMED:    Right & Left Heart  Catheterization with Native Coronary Angiography  via Right Common Femoral Artery & Right Common Femoral Vein Access.  PROCEDURE:Consent:  Risks of procedure as well as the alternatives and risks of each were explained to the (patient/caregiver).  Consent for procedure obtained. Consent for signed by MD and patient with RN witness -- placed on chart.   PROCEDURE: The patient was brought to the 2nd Floor [facility]atheter[facility] Lab in the fasting state and prepped and draped in the usual sterile fashion for Right groin access. Sterile technique was used including antiseptics, cap, gloves, gown, hand hygiene, mask and sheet.  Skin prep: Chlorhexidine.  Time Out: Verified patient identification, verified procedure, site/side was marked, verified correct patient position, special equipment/implants available, medications/allergies/relevent history reviewed, required imaging and test results available.  Performed  Access:  Right Common Femoral Artery: 5 Fr Sheath - fluoroscopically guided modified Seldinger Technique  Right Common Femoral Vein: 7 Fr Sheath - Seldinger Technique.  Right Heart Catheterization: 7Fr Theone Murdoch catheter advanced under fluoroscopy with balloon inflated to the RA, RV, then PCWP-PA for hemodynamic measurement.  Simultaneous FA & PA blood gases checked for SaO2% to calculate FICK CO/CI  Thermodilution Injections performed to calculate CO/CI  Simultaneous PCWP/LV & RV/LV pressures monitored with Angled Pigtail in LV.  Catheter removed completely out of the body with balloon deflated.  Left Heart Catheterization with Coronary Angiography:  JR4 advanced over Versicore wire to ascending aorta --> exchanges over Long Exchange J-wire., JL5-JL4-AlL1   Left Coronary Artery Angiography: AL1 (after JL5 & 4 unsuccessful)  Right Coronary Artery Angiography: JR4  LV Hemodynamics: JR4  Both Sheaths:  Removed in the PACU holding area with manual pressure held for  hemostasis.  MEDICATIONS:  Anesthesia:  Local Lidocaine 24 mL  Sedation:  75 mcg IV fentanyl ;   Omnipaque Contrast: 90 ml  FINDINGS:  Hemodynamics:  Findings:   SaO2%  Pressures mmHg  Mean P  mmHg  EDP  mmHg   Right Atrium    13/7    7   Right Ventricle    63/9    9   Pulmonary Artery   36% (RA)   60/33   48    PCWP    35/40   32-36    Transpulmonary gradient     12-16   Central Aortic   78% (RA)   152/96   101    Left Ventricle    151/25    36     following 40 mg IV Lasix   147/20    27    Cardiac Output:   Cardiac Index:    Fick   3.4    2.16    Thermodilution   3.0    1.9     Left Ventriculography: Not done due to possible LV apical thrombus - by Echo EF 20-25%  Coronary Anatomy:  Left Main: Large caliber vessel with a tortuous course that has a distal ~70-80% stenosis followed by an extremely aneurysmal T-point where the LAD and Circumflex arise.   LAD: Large caliber, diffusely calcified and ectatic vessel with extremely tortuous/corkscrew proximal course.  It gives rise to small first and second diagonal branches.  There are extensive septal collaterals which fill the Right Posterior Descending Artery (RPDA).  There is a diffuse 40% stenosis followed by a focal roughly 70% focal stenosis at the beginning of what appears to be a healed dissection prior to a large distal third diagonal branch branch.  This region is relatively long, and after 70% stenosis has minimal disease until just prior to D3 where there appears to be the other end of the healed dissection.  Beyond D3 the vessel wraps around the apex providing distal collaterals to the distal RPDA.  D3: Distal, major branch that is moderate caliber cover the distal anterolateral wall.  No significant disease.  Left Circumflex: This is a large caliber ectatic vessel with a proximal 60% stenosis.  There is extremely tortuous course where the vessel then bifurcates into a large lateral OM in the AV groove circumflex.  The  AV groove circumflex is proximally occluded after ectatic segment, with bridging collaterals to the distal portion that then fills the distal right posterior lateral system.  OM1: This is also a large caliber, ectatic vessel with a proximal 70% lesion.  The vessel itself bifurcates into what appears to be at least 2 major marginal branches but is subtotally occluded prior to this.  There is TIMI 1 flow into 2 distal branches that fill via collaterals from several small diagonal branches including D3.   RCA: 100 % chronic proximal total occlusion.  RPDA: fills via L-R septal perforators  RPL Sysytem:The RPAV fills via diffusely diseased AVgroove Circumflex collaterals  PATIENT DISPOSITION:    The patient was transferred to the PACU holding area in a hemodynamicaly stable, chest pain free condition.  The patient tolerated the procedure well, and there were no complications.  EBL:   < 10 ml  The patient was stable before, during, and after the procedure.  POST-OPERATIVE DIAGNOSIS:    Severe ischemic cardiomyopathy with known  ejection fraction of 20-25% that correlates with significantly decreased cardiac output and index with an average output of 3.2 and index of 2.  Significantly elevated Systemic Vascular Resistance, with SVR of 3000.    Severe pulmonary attention was transpulmonary gradient is borderline suggestive of possible mixed primary pulmonary and LV failure related pulmonary venous congestion.  PVR was 427  Severe aneurysmal/ectatic coronary disease with 100% chronically occluded RCA, 80% distal left main followed by a large aneurysmal segment, 100% occlusion of the AV groove circumflex, and chronic total occlusion of a large lateral OM.  There is also moderate to severe disease in the distal LAD.  None dilation of the ascending aorta as well as aortic arch and descending aorta into the abdomen.  PLAN OF CARE:  Will transfer the patient to the TCU.  Initiate afterload  reduction with ARB.  We'll also initiate IV diuresis.  She was given 40 mg IV Lasix in the Cath Lab.  CT surgery has already been counseled to do to the thoracic aneurysm, they will now see the patient post catheterization.  My expectation is that she would be considered to be a very high-risk patient for surgical options.  There are, however no valid percutaneous options.  Do to her potential for ongoing ischemia, she would not be a great candidate for inotropic assistance with her heart failure.  Would consider Life Vest on discharge, if there are thoughts of potential ICD placement in the future.  If she does get ICD she would need to by the ICU due to her LBBB.   Marykay Lex, M.D., M.S. [facility] [address] [place], [place]  [address]  [phone]  01/20/2013 2:19 PM

## 2013-01-20 NOTE — H&P (View-Only) (Signed)
 DAILY PROGRESS NOTE  Subjective:  No chest pain overnight. CT Aortogram yesterday showed stable fusiform aneurysmal dilitation of the descending thoracic aorta and proximal abdominal aorta, no dissection. There is mural thrombus noted in the proximal abdominal aorta measuring 1.7 cm. Bilateral pleural effusions.  Objective:  Temp:  [97.5 F (36.4 C)-98.6 F (37 C)] 97.5 F (36.4 C) (08/28 0755) Pulse Rate:  [64-123] 123 (08/28 0800) Resp:  [16-23] 19 (08/28 0800) BP: (120-136)/(57-104) 135/85 mmHg (08/28 0800) SpO2:  [88 %-100 %] 88 % (08/28 0800) Weight:  [120 lb 2.4 oz (54.5 kg)] 120 lb 2.4 oz (54.5 kg) (08/28 0414) Weight change: 10 lb 2.4 oz (4.604 kg)  Intake/Output from previous day: 08/27 0701 - 08/28 0700 In: 1413.2 [I.V.:1413.2] Out: 1775 [Urine:1775]  Intake/Output from this shift: Total I/O In: 61 [I.V.:61] Out: 100 [Urine:100]  Medications: Current Facility-Administered Medications  Medication Dose Route Frequency Provider Last Rate Last Dose  . 0.9 %  sodium chloride infusion   Intravenous Continuous Scott W. Zackowski, MD      . 0.9 %  sodium chloride infusion   Intravenous Continuous Scott W. Zackowski, MD 50 mL/hr at 01/19/13 2000    . acetaminophen (TYLENOL) tablet 650 mg  650 mg Oral Q4H PRN Bryan Hager, PA-C      . aspirin EC tablet 81 mg  81 mg Oral Daily Bryan Hager, PA-C   81 mg at 01/19/13 1051  . atorvastatin (LIPITOR) tablet 10 mg  10 mg Oral q1800 Bryan Hager, PA-C   10 mg at 01/19/13 1907  . heparin ADULT infusion 100 units/mL (25000 units/250 mL)  1,100 Units/hr Intravenous Continuous Thomas A Kelly, MD 11 mL/hr at 01/20/13 0634 1,100 Units/hr at 01/20/13 0634  . insulin aspart (novoLOG) injection 0-9 Units  0-9 Units Subcutaneous TID WC Bryan Hager, PA-C      . nitroGLYCERIN (NITROSTAT) SL tablet 0.4 mg  0.4 mg Sublingual Q5 Min x 3 PRN Bryan Hager, PA-C      . ondansetron (ZOFRAN) injection 4 mg  4 mg Intravenous Q6H PRN Bryan Hager, PA-C          Physical Exam: General appearance: alert and no distress Neck: no adenopathy, no carotid bruit, no JVD, supple, symmetrical, trachea midline and thyroid not enlarged, symmetric, no tenderness/mass/nodules Lungs: clear to auscultation bilaterally Heart: regular rate and rhythm, S1, S2 normal, no murmur, click, rub or gallop Abdomen: soft, non-tender; bowel sounds normal; no masses,  no organomegaly Extremities: extremities normal, atraumatic, no cyanosis or edema Pulses: 2+ and symmetric Skin: Skin color, texture, turgor normal. No rashes or lesions Neurologic: Grossly normal  Lab Results: Results for orders placed during the hospital encounter of 01/18/13 (from the past 48 hour(s))  BASIC METABOLIC PANEL     Status: Abnormal   Collection Time    01/18/13 11:36 AM      Result Value Range   Sodium 138  135 - 145 mEq/L   Potassium 3.7  3.5 - 5.1 mEq/L   Chloride 105  96 - 112 mEq/L   CO2 17 (*) 19 - 32 mEq/L   Glucose, Bld 288 (*) 70 - 99 mg/dL   BUN 16  6 - 23 mg/dL   Creatinine, Ser 0.67  0.50 - 1.10 mg/dL   Calcium 9.3  8.4 - 10.5 mg/dL   GFR calc non Af Amer 88 (*) >90 mL/min   GFR calc Af Amer >90  >90 mL/min   Comment: (NOTE)     The eGFR has   been calculated using the CKD EPI equation.     This calculation has not been validated in all clinical situations.     eGFR's persistently <90 mL/min signify possible Chronic Kidney     Disease.  CBC     Status: Abnormal   Collection Time    01/18/13 11:36 AM      Result Value Range   WBC 9.6  4.0 - 10.5 K/uL   RBC 4.61  3.87 - 5.11 MIL/uL   Hemoglobin 12.7  12.0 - 15.0 g/dL   HCT 37.9  36.0 - 46.0 %   MCV 82.2  78.0 - 100.0 fL   MCH 27.5  26.0 - 34.0 pg   MCHC 33.5  30.0 - 36.0 g/dL   RDW 19.2 (*) 11.5 - 15.5 %   Platelets 164  150 - 400 K/uL  POCT I-STAT TROPONIN I     Status: Abnormal   Collection Time    01/18/13 11:53 AM      Result Value Range   Troponin i, poc 0.10 (*) 0.00 - 0.08 ng/mL   Comment NOTIFIED  PHYSICIAN     Comment 3            Comment: Due to the release kinetics of cTnI,     a negative result within the first hours     of the onset of symptoms does not rule out     myocardial infarction with certainty.     If myocardial infarction is still suspected,     repeat the test at appropriate intervals.  PRO B NATRIURETIC PEPTIDE     Status: Abnormal   Collection Time    01/18/13 12:16 PM      Result Value Range   Pro B Natriuretic peptide (BNP) 7145.0 (*) 0 - 125 pg/mL  D-DIMER, QUANTITATIVE     Status: Abnormal   Collection Time    01/18/13 12:16 PM      Result Value Range   D-Dimer, Quant 11.61 (*) 0.00 - 0.48 ug/mL-FEU   Comment:            AT THE INHOUSE ESTABLISHED CUTOFF     VALUE OF 0.48 ug/mL FEU,     THIS ASSAY HAS BEEN DOCUMENTED     IN THE LITERATURE TO HAVE     A SENSITIVITY AND NEGATIVE     PREDICTIVE VALUE OF AT LEAST     98 TO 99%.  THE TEST RESULT     SHOULD BE CORRELATED WITH     AN ASSESSMENT OF THE CLINICAL     PROBABILITY OF DVT / VTE.  PROTIME-INR     Status: None   Collection Time    01/18/13 12:16 PM      Result Value Range   Prothrombin Time 14.0  11.6 - 15.2 seconds   INR 1.10  0.00 - 1.49  APTT     Status: None   Collection Time    01/18/13 12:16 PM      Result Value Range   aPTT 28  24 - 37 seconds  TROPONIN I     Status: None   Collection Time    01/18/13 12:16 PM      Result Value Range   Troponin I <0.30  <0.30 ng/mL   Comment:            Due to the release kinetics of cTnI,     a negative result within the first hours     of the onset   of symptoms does not rule out     myocardial infarction with certainty.     If myocardial infarction is still suspected,     repeat the test at appropriate intervals.  POCT I-STAT, CHEM 8     Status: Abnormal   Collection Time    01/18/13 12:18 PM      Result Value Range   Sodium 140  135 - 145 mEq/L   Potassium 3.8  3.5 - 5.1 mEq/L   Chloride 109  96 - 112 mEq/L   BUN 17  6 - 23 mg/dL    Creatinine, Ser 0.70  0.50 - 1.10 mg/dL   Glucose, Bld 292 (*) 70 - 99 mg/dL   Calcium, Ion 1.22  1.13 - 1.30 mmol/L   TCO2 19  0 - 100 mmol/L   Hemoglobin 15.3 (*) 12.0 - 15.0 g/dL   HCT 45.0  36.0 - 46.0 %  HEPARIN LEVEL (UNFRACTIONATED)     Status: Abnormal   Collection Time    01/18/13 12:39 PM      Result Value Range   Heparin Unfractionated <0.10 (*) 0.30 - 0.70 IU/mL   Comment:            IF HEPARIN RESULTS ARE BELOW     EXPECTED VALUES, AND PATIENT     DOSAGE HAS BEEN CONFIRMED,     SUGGEST FOLLOW UP TESTING     OF ANTITHROMBIN III LEVELS.     REPEATED TO VERIFY  URINALYSIS, ROUTINE W REFLEX MICROSCOPIC     Status: Abnormal   Collection Time    01/18/13  1:29 PM      Result Value Range   Color, Urine YELLOW  YELLOW   APPearance HAZY (*) CLEAR   Specific Gravity, Urine 1.015  1.005 - 1.030   pH 6.0  5.0 - 8.0   Glucose, UA 100 (*) NEGATIVE mg/dL   Hgb urine dipstick MODERATE (*) NEGATIVE   Bilirubin Urine NEGATIVE  NEGATIVE   Ketones, ur NEGATIVE  NEGATIVE mg/dL   Protein, ur NEGATIVE  NEGATIVE mg/dL   Urobilinogen, UA 0.2  0.0 - 1.0 mg/dL   Nitrite NEGATIVE  NEGATIVE   Leukocytes, UA LARGE (*) NEGATIVE  URINE MICROSCOPIC-ADD ON     Status: Abnormal   Collection Time    01/18/13  1:29 PM      Result Value Range   Squamous Epithelial / LPF FEW (*) RARE   WBC, UA 21-50  <3 WBC/hpf   RBC / HPF 3-6  <3 RBC/hpf   Bacteria, UA FEW (*) RARE  URINE CULTURE     Status: None   Collection Time    01/18/13  1:29 PM      Result Value Range   Specimen Description URINE, CATHETERIZED     Special Requests NONE     Culture  Setup Time       Value: 01/18/2013 14:30     Performed at [facility]   Colony Count       Value: >=100,000 COLONIES/ML     Performed at [facility]   Culture       Value: ESCHERICHIA COLI     Performed at [facility]   Report Status 01/20/2013 FINAL     Organism ID, Bacteria ESCHERICHIA COLI    HEPARIN LEVEL  (UNFRACTIONATED)     Status: Abnormal   Collection Time    01/18/13  6:15 PM      Result Value Range   Heparin Unfractionated   0.15 (*) 0.30 - 0.70 IU/mL   Comment:            IF HEPARIN RESULTS ARE BELOW     EXPECTED VALUES, AND PATIENT     DOSAGE HAS BEEN CONFIRMED,     SUGGEST FOLLOW UP TESTING     OF ANTITHROMBIN III LEVELS.  MRSA PCR SCREENING     Status: None   Collection Time    01/18/13  8:54 PM      Result Value Range   MRSA by PCR NEGATIVE  NEGATIVE   Comment:            The GeneXpert MRSA Assay (FDA     approved for NASAL specimens     only), is one component of a     comprehensive MRSA colonization     surveillance program. It is not     intended to diagnose MRSA     infection nor to guide or     monitor treatment for     MRSA infections.  GLUCOSE, CAPILLARY     Status: None   Collection Time    01/18/13  9:24 PM      Result Value Range   Glucose-Capillary 92  70 - 99 mg/dL   Comment 1 Notify RN    TROPONIN I     Status: Abnormal   Collection Time    01/18/13 10:30 PM      Result Value Range   Troponin I 3.30 (*) <0.30 ng/mL   Comment:            Due to the release kinetics of cTnI,     a negative result within the first hours     of the onset of symptoms does not rule out     myocardial infarction with certainty.     If myocardial infarction is still suspected,     repeat the test at appropriate intervals.     REPEATED TO VERIFY     CRITICAL RESULT CALLED TO, READ BACK BY AND VERIFIED WITH:     K. REID RN [id number] 0004 GREEN R  TSH     Status: None   Collection Time    01/18/13 10:30 PM      Result Value Range   TSH 0.823  0.350 - 4.500 uIU/mL   Comment: Performed at [facility]  HEMOGLOBIN A1C     Status: None   Collection Time    01/18/13 10:30 PM      Result Value Range   Hemoglobin A1C 5.6  <5.7 %   Comment: (NOTE)                                                                               According to the ADA Clinical Practice  Recommendations for 2011, when     HbA1c is used as a screening test:      >=6.5%   Diagnostic of Diabetes Mellitus               (if abnormal result is confirmed)     5.7-6.4%   Increased risk of developing Diabetes Mellitus     References:Diagnosis and Classification of Diabetes Mellitus,Diabetes       Care,2011,34(Suppl 1):S62-S69 and Standards of Medical Care in             Diabetes - 2011,Diabetes Care,2011,34 (Suppl 1):S11-S61.   Mean Plasma Glucose 114  <117 mg/dL   Comment: Performed at [facility]  HEPARIN LEVEL (UNFRACTIONATED)     Status: Abnormal   Collection Time    01/19/13  2:30 AM      Result Value Range   Heparin Unfractionated 0.16 (*) 0.30 - 0.70 IU/mL   Comment:            IF HEPARIN RESULTS ARE BELOW     EXPECTED VALUES, AND PATIENT     DOSAGE HAS BEEN CONFIRMED,     SUGGEST FOLLOW UP TESTING     OF ANTITHROMBIN III LEVELS.  TROPONIN I     Status: Abnormal   Collection Time    01/19/13  3:00 AM      Result Value Range   Troponin I 3.46 (*) <0.30 ng/mL   Comment:            Due to the release kinetics of cTnI,     a negative result within the first hours     of the onset of symptoms does not rule out     myocardial infarction with certainty.     If myocardial infarction is still suspected,     repeat the test at appropriate intervals.     REPEATED TO VERIFY     CRITICAL VALUE NOTED.  VALUE IS CONSISTENT WITH PREVIOUSLY REPORTED AND CALLED VALUE.  LIPID PANEL     Status: Abnormal   Collection Time    01/19/13  3:00 AM      Result Value Range   Cholesterol 169  0 - 200 mg/dL   Triglycerides 57  <150 mg/dL   HDL 55  >39 mg/dL   Total CHOL/HDL Ratio 3.1     VLDL 11  0 - 40 mg/dL   LDL Cholesterol 103 (*) 0 - 99 mg/dL   Comment:            Total Cholesterol/HDL:CHD Risk     Coronary Heart Disease Risk Table                         Men   Women      1/2 Average Risk   3.4   3.3      Average Risk       5.0   4.4      2 X Average Risk   9.6   7.1        3 X Average Risk  23.4   11.0                Use the calculated Patient Ratio     above and the CHD Risk Table     to determine the patient's CHD Risk.                ATP III CLASSIFICATION (LDL):      <100     mg/dL   Optimal      100-129  mg/dL   Near or Above                        Optimal      130-159  mg/dL   Borderline      160-189  mg/dL   High      >190       mg/dL   Very High  BASIC METABOLIC PANEL     Status: Abnormal   Collection Time    01/19/13  3:00 AM      Result Value Range   Sodium 140  135 - 145 mEq/L   Potassium 3.3 (*) 3.5 - 5.1 mEq/L   Chloride 106  96 - 112 mEq/L   CO2 24  19 - 32 mEq/L   Glucose, Bld 85  70 - 99 mg/dL   BUN 12  6 - 23 mg/dL   Creatinine, Ser 0.65  0.50 - 1.10 mg/dL   Calcium 9.2  8.4 - 10.5 mg/dL   GFR calc non Af Amer 89 (*) >90 mL/min   GFR calc Af Amer >90  >90 mL/min   Comment: (NOTE)     The eGFR has been calculated using the CKD EPI equation.     This calculation has not been validated in all clinical situations.     eGFR's persistently <90 mL/min signify possible Chronic Kidney     Disease.  CBC     Status: Abnormal   Collection Time    01/19/13  3:00 AM      Result Value Range   WBC 9.9  4.0 - 10.5 K/uL   RBC 4.28  3.87 - 5.11 MIL/uL   Hemoglobin 11.5 (*) 12.0 - 15.0 g/dL   Comment: DELTA CHECK NOTED     REPEATED TO VERIFY   HCT 34.9 (*) 36.0 - 46.0 %   MCV 81.5  78.0 - 100.0 fL   MCH 26.9  26.0 - 34.0 pg   MCHC 33.0  30.0 - 36.0 g/dL   RDW 19.0 (*) 11.5 - 15.5 %   Platelets 130 (*) 150 - 400 K/uL  GLUCOSE, CAPILLARY     Status: None   Collection Time    01/19/13  9:16 AM      Result Value Range   Glucose-Capillary 81  70 - 99 mg/dL  GLUCOSE, CAPILLARY     Status: None   Collection Time    01/19/13 12:16 PM      Result Value Range   Glucose-Capillary 91  70 - 99 mg/dL  HEPARIN LEVEL (UNFRACTIONATED)     Status: None   Collection Time    01/19/13 12:30 PM      Result Value Range   Heparin Unfractionated  0.37  0.30 - 0.70 IU/mL   Comment:            IF HEPARIN RESULTS ARE BELOW     EXPECTED VALUES, AND PATIENT     DOSAGE HAS BEEN CONFIRMED,     SUGGEST FOLLOW UP TESTING     OF ANTITHROMBIN III LEVELS.  TROPONIN I     Status: Abnormal   Collection Time    01/19/13 12:30 PM      Result Value Range   Troponin I 1.99 (*) <0.30 ng/mL   Comment:            Due to the release kinetics of cTnI,     a negative result within the first hours     of the onset of symptoms does not rule out     myocardial infarction with certainty.     If myocardial infarction is still suspected,     repeat the test at appropriate intervals.     CRITICAL VALUE NOTED.  VALUE IS CONSISTENT WITH PREVIOUSLY REPORTED AND CALLED VALUE.  GLUCOSE, CAPILLARY     Status: None   Collection Time      01/19/13  4:42 PM      Result Value Range   Glucose-Capillary 86  70 - 99 mg/dL  HEPARIN LEVEL (UNFRACTIONATED)     Status: None   Collection Time    01/19/13  6:17 PM      Result Value Range   Heparin Unfractionated 0.40  0.30 - 0.70 IU/mL   Comment:            IF HEPARIN RESULTS ARE BELOW     EXPECTED VALUES, AND PATIENT     DOSAGE HAS BEEN CONFIRMED,     SUGGEST FOLLOW UP TESTING     OF ANTITHROMBIN III LEVELS.  GLUCOSE, CAPILLARY     Status: None   Collection Time    01/19/13 10:22 PM      Result Value Range   Glucose-Capillary 89  70 - 99 mg/dL  CBC     Status: Abnormal   Collection Time    01/20/13  4:00 AM      Result Value Range   WBC 5.8  4.0 - 10.5 K/uL   RBC 3.98  3.87 - 5.11 MIL/uL   Hemoglobin 10.8 (*) 12.0 - 15.0 g/dL   HCT 32.6 (*) 36.0 - 46.0 %   MCV 81.9  78.0 - 100.0 fL   MCH 27.1  26.0 - 34.0 pg   MCHC 33.1  30.0 - 36.0 g/dL   RDW 19.0 (*) 11.5 - 15.5 %   Platelets 140 (*) 150 - 400 K/uL  HEPARIN LEVEL (UNFRACTIONATED)     Status: Abnormal   Collection Time    01/20/13  4:00 AM      Result Value Range   Heparin Unfractionated 0.25 (*) 0.30 - 0.70 IU/mL   Comment:            IF HEPARIN  RESULTS ARE BELOW     EXPECTED VALUES, AND PATIENT     DOSAGE HAS BEEN CONFIRMED,     SUGGEST FOLLOW UP TESTING     OF ANTITHROMBIN III LEVELS.  BASIC METABOLIC PANEL     Status: Abnormal   Collection Time    01/20/13  4:00 AM      Result Value Range   Sodium 141  135 - 145 mEq/L   Potassium 3.7  3.5 - 5.1 mEq/L   Chloride 106  96 - 112 mEq/L   CO2 24  19 - 32 mEq/L   Glucose, Bld 75  70 - 99 mg/dL   BUN 10  6 - 23 mg/dL   Creatinine, Ser 0.74  0.50 - 1.10 mg/dL   Calcium 9.3  8.4 - 10.5 mg/dL   GFR calc non Af Amer 85 (*) >90 mL/min   GFR calc Af Amer >90  >90 mL/min   Comment: (NOTE)     The eGFR has been calculated using the CKD EPI equation.     This calculation has not been validated in all clinical situations.     eGFR's persistently <90 mL/min signify possible Chronic Kidney     Disease.  HEPATIC FUNCTION PANEL     Status: Abnormal   Collection Time    01/20/13  4:00 AM      Result Value Range   Total Protein 6.3  6.0 - 8.3 g/dL   Albumin 3.1 (*) 3.5 - 5.2 g/dL   AST 23  0 - 37 U/L   ALT 10  0 - 35 U/L   Alkaline Phosphatase 77  39 - 117 U/L   Total Bilirubin 0.7    0.3 - 1.2 mg/dL   Bilirubin, Direct 0.2  0.0 - 0.3 mg/dL   Indirect Bilirubin 0.5  0.3 - 0.9 mg/dL  PRO B NATRIURETIC PEPTIDE     Status: Abnormal   Collection Time    01/20/13  4:00 AM      Result Value Range   Pro B Natriuretic peptide (BNP) 4067.0 (*) 0 - 125 pg/mL  GLUCOSE, CAPILLARY     Status: None   Collection Time    01/20/13  7:57 AM      Result Value Range   Glucose-Capillary 75  70 - 99 mg/dL    Imaging: Ct Angio Chest Pe W/cm &/or Wo Cm  01/18/2013   *RADIOLOGY REPORT*  Clinical Data: Elevated D-dimer, shortness of breath, chest pain, evaluate for pulmonary embolism  CT ANGIOGRAPHY CHEST  Technique:  Multidetector CT imaging of the chest using the standard protocol during bolus administration of intravenous contrast. Multiplanar reconstructed images including MIPs were obtained and  reviewed to evaluate the vascular anatomy.  Contrast: 100mL OMNIPAQUE IOHEXOL 350 MG/ML SOLN  Comparison: Chest radiograph - earlier same day  Vascular Findings:  There is adequate opacification of the pulmonary arterial system of the main pulmonary artery measuring 517 HU. There is a small nonocclusive bleb within an anterior segmental branch of the left upper lobe (image 102, series 5, coronal image 40 dB).  Otherwise, there are no discrete filling defects within the pulmonary arterial tree.  The caliber of the main pulmonary artery is enlarged measuring 34 mm in diameter.  Marked cardiomegaly, in particular, there is marked enlargement of the left ventricle.  Coronary artery calcifications.  Trace amount of presumably physiologic pericardial fluid.  While this examination was not tailored for evaluation of the thoracic aorta, there is fusiform aneurysmal dilatation of the ascending thoracic aorta measuring 41 mm in greatest oblique axial dimension (image 68, series four).  The thoracic aorta remains ectatic proximally measuring approximately 37 mm at the level of the aortic arch (image 43, series four).  There is a minimal amount of apparent crescentic mural thrombus within the cranial aspect of the descending thoracic aorta (image 55, series four).  While mid aspect of the descending thoracic aorta tapers to a near normal caliber (measuring approximately 30 mm in diameter - image 82, series four), the distal aspect of the descending thoracic aorta is tortuous and aneurysmal measuring approximately 43 mm at the level of the diaphragmatic hiatus.  There is apparent crescentic mural thrombus within the aneurysmal descending thoracic aorta (image 126, series four).  No definite periaortic stranding.  ---------------------------------------------------------  Nonvascular findings:  Small to moderate-sized bilateral pleural effusions with associated adjacent compressive atelectasis, right greater than left.  There is  rather diffuse mild interlobular septal thickening and ground glass opacification suggestive of pulmonary edema.  Mild centrilobular paraseptal emphysematous change is suspected.  There is a minimal amount of debris within distal aspect of the trachea extending to the right main stem bronchus.  The central pulmonary airways remain patent.  Scattered shoddy mediastinal lymph nodes are borderline enlarged with index precarinal node conglomeration measuring 1.1 cm in short axis diameter (image 56, series 4 and index prevascular node measuring 1.1 cm.  No definite hilar or axillary lymphadenopathy.  Limited early arterial phase evaluation of the upper abdomen demonstrates a left sided nephrolithiasis with prominent stone measuring 3.6 mm (image 132).  Incidental note of a small splenule.  No acute or aggressive osseous abnormalities.  Dystrophic calcifications within the right lobe of the   thyroid.  IMPRESSION:  1.  No acute pulmonary embolism.  Tiny nonocclusive pulmonary arterial web incidentally noted within an anterior segmental branch of the left upper lobe, likely the sequela of remote/prior pulmonary embolism.  2.  Findings suggestive of pulmonary edema with small bilateral pleural effusions and associated adjacent compressive atelectasis, right greater than left.  3.  Cardiomegaly.  Coronary calcifications. Enlarged caliber of the main pulmonary artery, nonspecific though may be seen in the setting of pulmonary arterial hypertension. Further evaluation with cardiac echo may be performed if clinically indicated.  4.  While this examination was not tailored for evaluation of the thoracic aorta, note is made of aneurysmal dilatation of the ascending thoracic aorta measuring approximately 41 mm extending through the aortic arch and proximal descending thoracic aorta. While the mid aspect of the thoracic spine tapers to a near normal caliber, the distal aspect of the descending thoracic aorta is tortuous and  aneurysmal measuring 43 mm in diameter and containing mural thrombus.  Non emergent referral to the thoracic surgery and dedicated TAA CT is recommended.  5.  Incidentally noted left-sided nephrolithiasis.  7.  Dystrophic calcifications in the right lobe of the thyroid. Further evaluation with dedicated thyroid ultrasound may be performed as clinically indicated.   Original Report Authenticated By: John Watts V, MD   Dg Chest Portable 1 View  01/18/2013   CLINICAL DATA:  Shortness of breath, smoker.  EXAM: PORTABLE CHEST - 1 VIEW  COMPARISON:  08/11/2010  FINDINGS: There is cardiomegaly. Diffuse interstitial and alveolar opacities throughout the lungs compatible with edema/ CHF. No visible effusions. No acute bony abnormality.  IMPRESSION: Mild to moderate CHF.   Electronically Signed   By: Kevin  Dover   On: 01/18/2013 12:22   Ct Angio Chest Aortic Dissect W &/or W/o  01/19/2013   CLINICAL DATA:  Enlarged descending thoracic aorta on a PE protocol chest CTA obtained yesterday.  EXAM: CT ANGIOGRAPHY CHEST WITH CONTRAST  TECHNIQUE: Multidetector CT imaging of the chest was performed using the standard protocol during bolus administration of intravenous contrast. Multiplanar CT image reconstructions including MIPs were obtained to evaluate the vascular anatomy.  CONTRAST:  100 cc Omnipaque 350  COMPARISON:  Yesterday.  FINDINGS: The pulmonary arteries are better opacified than the thoracic aorta. Again demonstrated is fusiform aneurysmal dilatation of the descending thoracic aorta without dissection. There is a focal indentation in the posterior aspect of the descending thoracic aorta by the posterior aspect of the left hemidiaphragm with continued aneurysmal dilatation of the proximal abdominal aorta below the level of the diaphragm. No aortic dissection in the upper abdomen.  The ascending thoracic aorta measures 3.8 cm in maximum diameter at the level of the main pulmonary artery. The proximal descending  thoracic aorta measures 4.2 cm in maximum transverse diameter with mild concentric mural thrombus. The distal descending thoracic aorta measures 3.7 cm in maximum transverse diameter with mild concentric mural thrombus. The proximal abdominal aorta measures 4.6 cm in maximum transverse diameter with a moderate concentric mural thrombus. Slightly more inferiorly, above the level of the origin of the renal arteries, there is a greater degree of mural thrombus in the proximal abdominal aorta, with a maximum thickness of 1.7 cm.  Atheromatous arterial calcifications are noted, including dense coronary artery calcifications. Again noted is normal opacification of the pulmonary arteries with no pulmonary arterial filling defects seen.  Small to moderate-sized bilateral pleural effusions have not changed significantly. No significant change in associated bilateral lower lobe dependent atelectasis.   The majority of the esophagus remains moderately dilated with some dependent high density material. The dependent high density material was not previously present.  The heart remains enlarged. Mildly enlarged mediastinal lymph nodes are again demonstrated. These include a right paratracheal node with a short axis diameter of 2.0 cm on image number 20. An AP window lymph node has a short axis diameter of 9 mm on image number 21. Mild thoracic spine degenerative changes. Tiny bilateral renal calculi. Small right kidney. Left renal cyst. Previously noted small right lobe thyroid calcified nodules.  Review of the MIP images confirms the above findings.  IMPRESSION: 1. Stable fusiform aneurysmal dilatation of the descending thoracic aorta and proximal abdominal aorta, as described above. No dissection. 2. Stable small to moderate-sized bilateral pleural effusions and associated bilateral lower lobe atelectasis. 3. Stable mild mediastinal adenopathy. 4. Previously noted small right lobe thyroid calcified nodules. 5. Stable moderate  diffuse dilatation of the esophagus. 6. Stable tiny bilateral renal calculi, right renal atrophy and left renal cyst.   Electronically Signed   By: Steve  Reid   On: 01/19/2013 21:20    Assessment:  1. Principal Problem: 2.   NSTEMI (non-ST elevated myocardial infarction) 3. Active Problems: 4.   LBBB (left bundle branch block) 5.   Dyspnea, acute 6.   Tobacco abuse 7.   CAD in native artery, wth hx MI 15 yrs ago 8.   Aneurysmal dilatation both ascending and descending thoracic aorta wth mural thrombus 9.   Hyperglycemia 10.   Hypokalemia 11.   Thoracic aortic aneurysm 12.   Acute systolic congestive heart failure, NYHA class 2 13.   Plan:  1. CT Aortogram negative for dissection - mural thrombus noted. NSTEMI this admission. CT does show multivessel coronary calcification - EF 20-25% by echo with inferior akinesis (probably prior infarct). She will need cardiac catheterization to define her anatomy, which I suspect will be surgical. She has been NPO, will place her on for add-on case today with Dr. Harding.  She is agreeable to cardiac catheterization.  Time Spent Directly with Patient:  15 minutes  Length of Stay:  LOS: 2 days   Doctor Sheahan C. Larwence Tu, MD, FACC Attending Cardiologist [facility]  Toluwanimi Radebaugh C 01/20/2013, 9:13 AM     

## 2013-01-20 NOTE — Interval H&P Note (Signed)
History and Physical Interval Note:  01/20/2013 12:37 PM  Kathryn Dixon  has presented today for surgery, with the diagnosis of cp  The various methods of treatment have been discussed with the patient and family. After consideration of risks, benefits and other options for treatment, the patient has consented to  Procedure(s): LEFT & RIGHT HEART CATHETERIZATION WITH CORONARY ANGIOGRAM (N/A) +/- PERCUTANEOUS CORONARY INTERVENTION as a surgical intervention .  The patient's history has been reviewed, patient examined, no change in status, stable for surgery.  I have reviewed the patient's chart and labs.  Questions were answered to the patient's satisfaction.     Elvy Mclarty W Cath Lab Visit (complete for each Cath Lab visit)  Clinical Evaluation Leading to the Procedure:   ACS: yes  Non-ACS:    Anginal Classification: CCS IV  Anti-ischemic medical therapy: Minimal Therapy (1 class of medications)  Non-Invasive Test Results: No non-invasive testing performed  Prior CABG: No previous CABG

## 2013-01-20 NOTE — Progress Notes (Signed)
ANTICOAGULATION CONSULT NOTE   Pharmacy Consult for heparin Indication: chest pain/ACS  No Known Allergies  Patient Measurements: Height: 5\' 4"  (162.6 cm) Weight: 119 lb 0.8 oz (54 kg) IBW/kg (Calculated) : 54.7  Vital Signs: Temp: 98.3 F (36.8 C) (08/28 1131) Temp src: Oral (08/28 1131) BP: 118/68 mmHg (08/28 1131) Pulse Rate: 74 (08/28 1131)  Labs:  Recent Labs  01/18/13 1136  01/18/13 1216 01/18/13 1218  01/18/13 2230  01/19/13 0300 01/19/13 1230 01/19/13 1817 01/20/13 0400 01/20/13 1123  HGB 12.7  --   --  15.3*  --   --   --  11.5*  --   --  10.8*  --   HCT 37.9  --   --  45.0  --   --   --  34.9*  --   --  32.6*  --   PLT 164  --   --   --   --   --   --  130*  --   --  140*  --   APTT  --   --  28  --   --   --   --   --   --   --   --   --   LABPROT  --   --  14.0  --   --   --   --   --   --   --   --   --   INR  --   --  1.10  --   --   --   --   --   --   --   --   --   HEPARINUNFRC  --   --   --   --   < >  --   < >  --  0.37 0.40 0.25* 0.49  CREATININE 0.67  --   --  0.70  --   --   --  0.65  --   --  0.74  --   TROPONINI  --   < > <0.30  --   --  3.30*  --  3.46* 1.99*  --   --   --   < > = values in this interval not displayed.  Estimated Creatinine Clearance: 57.4 ml/min (by C-G formula based on Cr of 0.74).  Assessment: 67 yo female with chest pain and elevated cardiac enzymes continues on heparin gtt. Also noted to have a mural thrombus. Heparin level is now therapeutic at 0.49. No bleeding noted. Planning cath today.   Goal of Therapy:  Heparin level 0.3-0.7 units/ml Monitor platelets by anticoagulation protocol: Yes   Plan:  1. Continue heparin drip at 1100 units/hr 2. Check a 6 hour heparin level to confirm or f/u post-cath  Lysle Pearl, PharmD, BCPS Pager # [phone] 01/20/2013 11:52 AM

## 2013-01-20 NOTE — Progress Notes (Signed)
ANTICOAGULATION CONSULT NOTE - Follow Up Consult    HL = 0.25 Heparin dosing weight = 55 kg   Assessment: 67 YOF on IV heparin for ACS and mural thrombus seen on CT.  Cardiac cath is delayed.  Heparin level is subtherapeutic; no bleeding reported.   Plan: - increase heparin to 1100 units/hr and f/u in 6 hours with HL    Kathryn Dixon

## 2013-01-20 NOTE — Progress Notes (Signed)
DAILY PROGRESS NOTE  Subjective:  No chest pain overnight. CT Aortogram yesterday showed stable fusiform aneurysmal dilitation of the descending thoracic aorta and proximal abdominal aorta, no dissection. There is mural thrombus noted in the proximal abdominal aorta measuring 1.7 cm. Bilateral pleural effusions.  Objective:  Temp:  [97.5 F (36.4 C)-98.6 F (37 C)] 97.5 F (36.4 C) (08/28 0755) Pulse Rate:  [64-123] 123 (08/28 0800) Resp:  [16-23] 19 (08/28 0800) BP: (120-136)/(57-104) 135/85 mmHg (08/28 0800) SpO2:  [88 %-100 %] 88 % (08/28 0800) Weight:  [120 lb 2.4 oz (54.5 kg)] 120 lb 2.4 oz (54.5 kg) (08/28 0414) Weight change: 10 lb 2.4 oz (4.604 kg)  Intake/Output from previous day: 08/27 0701 - 08/28 0700 In: 1413.2 [I.V.:1413.2] Out: 1775 [Urine:1775]  Intake/Output from this shift: Total I/O In: 61 [I.V.:61] Out: 100 [Urine:100]  Medications: Current Facility-Administered Medications  Medication Dose Route Frequency Provider Last Rate Last Dose  . 0.9 %  sodium chloride infusion   Intravenous Continuous Shelda Jakes, MD      . 0.9 %  sodium chloride infusion   Intravenous Continuous Shelda Jakes, MD 50 mL/hr at 01/19/13 2000    . acetaminophen (TYLENOL) tablet 650 mg  650 mg Oral Q4H PRN Wilburt Finlay, PA-C      . aspirin EC tablet 81 mg  81 mg Oral Daily Wilburt Finlay, PA-C   81 mg at 01/19/13 1051  . atorvastatin (LIPITOR) tablet 10 mg  10 mg Oral q1800 Wilburt Finlay, PA-C   10 mg at 01/19/13 1907  . heparin ADULT infusion 100 units/mL (25000 units/250 mL)  1,100 Units/hr Intravenous Continuous Lennette Bihari, MD 11 mL/hr at 01/20/13 0634 1,100 Units/hr at 01/20/13 0634  . insulin aspart (novoLOG) injection 0-9 Units  0-9 Units Subcutaneous TID WC Bryan Hager, PA-C      . nitroGLYCERIN (NITROSTAT) SL tablet 0.4 mg  0.4 mg Sublingual Q5 Min x 3 PRN Wilburt Finlay, PA-C      . ondansetron (ZOFRAN) injection 4 mg  4 mg Intravenous Q6H PRN Wilburt Finlay, PA-C          Physical Exam: General appearance: alert and no distress Neck: no adenopathy, no carotid bruit, no JVD, supple, symmetrical, trachea midline and thyroid not enlarged, symmetric, no tenderness/mass/nodules Lungs: clear to auscultation bilaterally Heart: regular rate and rhythm, S1, S2 normal, no murmur, click, rub or gallop Abdomen: soft, non-tender; bowel sounds normal; no masses,  no organomegaly Extremities: extremities normal, atraumatic, no cyanosis or edema Pulses: 2+ and symmetric Skin: Skin color, texture, turgor normal. No rashes or lesions Neurologic: Grossly normal  Lab Results: Results for orders placed during the hospital encounter of 01/18/13 (from the past 48 hour(s))  BASIC METABOLIC PANEL     Status: Abnormal   Collection Time    01/18/13 11:36 AM      Result Value Range   Sodium 138  135 - 145 mEq/L   Potassium 3.7  3.5 - 5.1 mEq/L   Chloride 105  96 - 112 mEq/L   CO2 17 (*) 19 - 32 mEq/L   Glucose, Bld 288 (*) 70 - 99 mg/dL   BUN 16  6 - 23 mg/dL   Creatinine, Ser 1.61  0.50 - 1.10 mg/dL   Calcium 9.3  8.4 - 09.6 mg/dL   GFR calc non Af Amer 88 (*) >90 mL/min   GFR calc Af Amer >90  >90 mL/min   Comment: (NOTE)     The eGFR has  been calculated using the CKD EPI equation.     This calculation has not been validated in all clinical situations.     eGFR's persistently <90 mL/min signify possible Chronic Kidney     Disease.  CBC     Status: Abnormal   Collection Time    01/18/13 11:36 AM      Result Value Range   WBC 9.6  4.0 - 10.5 K/uL   RBC 4.61  3.87 - 5.11 MIL/uL   Hemoglobin 12.7  12.0 - 15.0 g/dL   HCT 14.7  82.9 - 56.2 %   MCV 82.2  78.0 - 100.0 fL   MCH 27.5  26.0 - 34.0 pg   MCHC 33.5  30.0 - 36.0 g/dL   RDW 13.0 (*) 86.5 - 78.4 %   Platelets 164  150 - 400 K/uL  POCT I-STAT TROPONIN I     Status: Abnormal   Collection Time    01/18/13 11:53 AM      Result Value Range   Troponin i, poc 0.10 (*) 0.00 - 0.08 ng/mL   Comment NOTIFIED  PHYSICIAN     Comment 3            Comment: Due to the release kinetics of cTnI,     a negative result within the first hours     of the onset of symptoms does not rule out     myocardial infarction with certainty.     If myocardial infarction is still suspected,     repeat the test at appropriate intervals.  PRO B NATRIURETIC PEPTIDE     Status: Abnormal   Collection Time    01/18/13 12:16 PM      Result Value Range   Pro B Natriuretic peptide (BNP) 7145.0 (*) 0 - 125 pg/mL  D-DIMER, QUANTITATIVE     Status: Abnormal   Collection Time    01/18/13 12:16 PM      Result Value Range   D-Dimer, Quant 11.61 (*) 0.00 - 0.48 ug/mL-FEU   Comment:            AT THE INHOUSE ESTABLISHED CUTOFF     VALUE OF 0.48 ug/mL FEU,     THIS ASSAY HAS BEEN DOCUMENTED     IN THE LITERATURE TO HAVE     A SENSITIVITY AND NEGATIVE     PREDICTIVE VALUE OF AT LEAST     98 TO 99%.  THE TEST RESULT     SHOULD BE CORRELATED WITH     AN ASSESSMENT OF THE CLINICAL     PROBABILITY OF DVT / VTE.  PROTIME-INR     Status: None   Collection Time    01/18/13 12:16 PM      Result Value Range   Prothrombin Time 14.0  11.6 - 15.2 seconds   INR 1.10  0.00 - 1.49  APTT     Status: None   Collection Time    01/18/13 12:16 PM      Result Value Range   aPTT 28  24 - 37 seconds  TROPONIN I     Status: None   Collection Time    01/18/13 12:16 PM      Result Value Range   Troponin I <0.30  <0.30 ng/mL   Comment:            Due to the release kinetics of cTnI,     a negative result within the first hours     of the onset  of symptoms does not rule out     myocardial infarction with certainty.     If myocardial infarction is still suspected,     repeat the test at appropriate intervals.  POCT I-STAT, CHEM 8     Status: Abnormal   Collection Time    01/18/13 12:18 PM      Result Value Range   Sodium 140  135 - 145 mEq/L   Potassium 3.8  3.5 - 5.1 mEq/L   Chloride 109  96 - 112 mEq/L   BUN 17  6 - 23 mg/dL    Creatinine, Ser 4.09  0.50 - 1.10 mg/dL   Glucose, Bld 811 (*) 70 - 99 mg/dL   Calcium, Ion 9.14  7.82 - 1.30 mmol/L   TCO2 19  0 - 100 mmol/L   Hemoglobin 15.3 (*) 12.0 - 15.0 g/dL   HCT 95.6  21.3 - 08.6 %  HEPARIN LEVEL (UNFRACTIONATED)     Status: Abnormal   Collection Time    01/18/13 12:39 PM      Result Value Range   Heparin Unfractionated <0.10 (*) 0.30 - 0.70 IU/mL   Comment:            IF HEPARIN RESULTS ARE BELOW     EXPECTED VALUES, AND PATIENT     DOSAGE HAS BEEN CONFIRMED,     SUGGEST FOLLOW UP TESTING     OF ANTITHROMBIN III LEVELS.     REPEATED TO VERIFY  URINALYSIS, ROUTINE W REFLEX MICROSCOPIC     Status: Abnormal   Collection Time    01/18/13  1:29 PM      Result Value Range   Color, Urine YELLOW  YELLOW   APPearance HAZY (*) CLEAR   Specific Gravity, Urine 1.015  1.005 - 1.030   pH 6.0  5.0 - 8.0   Glucose, UA 100 (*) NEGATIVE mg/dL   Hgb urine dipstick MODERATE (*) NEGATIVE   Bilirubin Urine NEGATIVE  NEGATIVE   Ketones, ur NEGATIVE  NEGATIVE mg/dL   Protein, ur NEGATIVE  NEGATIVE mg/dL   Urobilinogen, UA 0.2  0.0 - 1.0 mg/dL   Nitrite NEGATIVE  NEGATIVE   Leukocytes, UA LARGE (*) NEGATIVE  URINE MICROSCOPIC-ADD ON     Status: Abnormal   Collection Time    01/18/13  1:29 PM      Result Value Range   Squamous Epithelial / LPF FEW (*) RARE   WBC, UA 21-50  <3 WBC/hpf   RBC / HPF 3-6  <3 RBC/hpf   Bacteria, UA FEW (*) RARE  URINE CULTURE     Status: None   Collection Time    01/18/13  1:29 PM      Result Value Range   Specimen Description URINE, CATHETERIZED     Special Requests NONE     Culture  Setup Time       Value: 01/18/2013 14:30     Performed at [facility] Count       Value: >=100,000 COLONIES/ML     Performed at [facility]   Culture       Value: ESCHERICHIA COLI     Performed at [facility]   Report Status 01/20/2013 FINAL     Organism ID, Bacteria ESCHERICHIA COLI    HEPARIN LEVEL  (UNFRACTIONATED)     Status: Abnormal   Collection Time    01/18/13  6:15 PM      Result Value Range   Heparin Unfractionated  0.15 (*) 0.30 - 0.70 IU/mL   Comment:            IF HEPARIN RESULTS ARE BELOW     EXPECTED VALUES, AND PATIENT     DOSAGE HAS BEEN CONFIRMED,     SUGGEST FOLLOW UP TESTING     OF ANTITHROMBIN III LEVELS.  MRSA PCR SCREENING     Status: None   Collection Time    01/18/13  8:54 PM      Result Value Range   MRSA by PCR NEGATIVE  NEGATIVE   Comment:            The GeneXpert MRSA Assay (FDA     approved for NASAL specimens     only), is one component of a     comprehensive MRSA colonization     surveillance program. It is not     intended to diagnose MRSA     infection nor to guide or     monitor treatment for     MRSA infections.  GLUCOSE, CAPILLARY     Status: None   Collection Time    01/18/13  9:24 PM      Result Value Range   Glucose-Capillary 92  70 - 99 mg/dL   Comment 1 Notify RN    TROPONIN I     Status: Abnormal   Collection Time    01/18/13 10:30 PM      Result Value Range   Troponin I 3.30 (*) <0.30 ng/mL   Comment:            Due to the release kinetics of cTnI,     a negative result within the first hours     of the onset of symptoms does not rule out     myocardial infarction with certainty.     If myocardial infarction is still suspected,     repeat the test at appropriate intervals.     REPEATED TO VERIFY     CRITICAL RESULT CALLED TO, READ BACK BY AND VERIFIED WITH:     Earlean Polka RN [phone] GREEN R  TSH     Status: None   Collection Time    01/18/13 10:30 PM      Result Value Range   TSH 0.823  0.350 - 4.500 uIU/mL   Comment: Performed at [facility]  HEMOGLOBIN A1C     Status: None   Collection Time    01/18/13 10:30 PM      Result Value Range   Hemoglobin A1C 5.6  <5.7 %   Comment: (NOTE)                                                                               According to the ADA Clinical Practice  Recommendations for 2011, when     HbA1c is used as a screening test:      >=6.5%   Diagnostic of Diabetes Mellitus               (if abnormal result is confirmed)     5.7-6.4%   Increased risk of developing Diabetes Mellitus     References:Diagnosis and Classification of Diabetes Mellitus,Diabetes  UJWJ,1914,78(GNFAO 1):S62-S69 and Standards of Medical Care in             Diabetes - 2011,Diabetes Care,2011,34 (Suppl 1):S11-S61.   Mean Plasma Glucose 114  <117 mg/dL   Comment: Performed at [facility]  HEPARIN LEVEL (UNFRACTIONATED)     Status: Abnormal   Collection Time    01/19/13  2:30 AM      Result Value Range   Heparin Unfractionated 0.16 (*) 0.30 - 0.70 IU/mL   Comment:            IF HEPARIN RESULTS ARE BELOW     EXPECTED VALUES, AND PATIENT     DOSAGE HAS BEEN CONFIRMED,     SUGGEST FOLLOW UP TESTING     OF ANTITHROMBIN III LEVELS.  TROPONIN I     Status: Abnormal   Collection Time    01/19/13  3:00 AM      Result Value Range   Troponin I 3.46 (*) <0.30 ng/mL   Comment:            Due to the release kinetics of cTnI,     a negative result within the first hours     of the onset of symptoms does not rule out     myocardial infarction with certainty.     If myocardial infarction is still suspected,     repeat the test at appropriate intervals.     REPEATED TO VERIFY     CRITICAL VALUE NOTED.  VALUE IS CONSISTENT WITH PREVIOUSLY REPORTED AND CALLED VALUE.  LIPID PANEL     Status: Abnormal   Collection Time    01/19/13  3:00 AM      Result Value Range   Cholesterol 169  0 - 200 mg/dL   Triglycerides 57  <130 mg/dL   HDL 55  >86 mg/dL   Total CHOL/HDL Ratio 3.1     VLDL 11  0 - 40 mg/dL   LDL Cholesterol 578 (*) 0 - 99 mg/dL   Comment:            Total Cholesterol/HDL:CHD Risk     Coronary Heart Disease Risk Table                         Men   Women      1/2 Average Risk   3.4   3.3      Average Risk       5.0   4.4      2 X Average Risk   9.6   7.1        3 X Average Risk  23.4   11.0                Use the calculated Patient Ratio     above and the CHD Risk Table     to determine the patient's CHD Risk.                ATP III CLASSIFICATION (LDL):      <100     mg/dL   Optimal      469-629  mg/dL   Near or Above                        Optimal      130-159  mg/dL   Borderline      528-413  mg/dL   High      >244  mg/dL   Very High  BASIC METABOLIC PANEL     Status: Abnormal   Collection Time    01/19/13  3:00 AM      Result Value Range   Sodium 140  135 - 145 mEq/L   Potassium 3.3 (*) 3.5 - 5.1 mEq/L   Chloride 106  96 - 112 mEq/L   CO2 24  19 - 32 mEq/L   Glucose, Bld 85  70 - 99 mg/dL   BUN 12  6 - 23 mg/dL   Creatinine, Ser 1.61  0.50 - 1.10 mg/dL   Calcium 9.2  8.4 - 09.6 mg/dL   GFR calc non Af Amer 89 (*) >90 mL/min   GFR calc Af Amer >90  >90 mL/min   Comment: (NOTE)     The eGFR has been calculated using the CKD EPI equation.     This calculation has not been validated in all clinical situations.     eGFR's persistently <90 mL/min signify possible Chronic Kidney     Disease.  CBC     Status: Abnormal   Collection Time    01/19/13  3:00 AM      Result Value Range   WBC 9.9  4.0 - 10.5 K/uL   RBC 4.28  3.87 - 5.11 MIL/uL   Hemoglobin 11.5 (*) 12.0 - 15.0 g/dL   Comment: DELTA CHECK NOTED     REPEATED TO VERIFY   HCT 34.9 (*) 36.0 - 46.0 %   MCV 81.5  78.0 - 100.0 fL   MCH 26.9  26.0 - 34.0 pg   MCHC 33.0  30.0 - 36.0 g/dL   RDW 04.5 (*) 40.9 - 81.1 %   Platelets 130 (*) 150 - 400 K/uL  GLUCOSE, CAPILLARY     Status: None   Collection Time    01/19/13  9:16 AM      Result Value Range   Glucose-Capillary 81  70 - 99 mg/dL  GLUCOSE, CAPILLARY     Status: None   Collection Time    01/19/13 12:16 PM      Result Value Range   Glucose-Capillary 91  70 - 99 mg/dL  HEPARIN LEVEL (UNFRACTIONATED)     Status: None   Collection Time    01/19/13 12:30 PM      Result Value Range   Heparin Unfractionated  0.37  0.30 - 0.70 IU/mL   Comment:            IF HEPARIN RESULTS ARE BELOW     EXPECTED VALUES, AND PATIENT     DOSAGE HAS BEEN CONFIRMED,     SUGGEST FOLLOW UP TESTING     OF ANTITHROMBIN III LEVELS.  TROPONIN I     Status: Abnormal   Collection Time    01/19/13 12:30 PM      Result Value Range   Troponin I 1.99 (*) <0.30 ng/mL   Comment:            Due to the release kinetics of cTnI,     a negative result within the first hours     of the onset of symptoms does not rule out     myocardial infarction with certainty.     If myocardial infarction is still suspected,     repeat the test at appropriate intervals.     CRITICAL VALUE NOTED.  VALUE IS CONSISTENT WITH PREVIOUSLY REPORTED AND CALLED VALUE.  GLUCOSE, CAPILLARY     Status: None   Collection Time  01/19/13  4:42 PM      Result Value Range   Glucose-Capillary 86  70 - 99 mg/dL  HEPARIN LEVEL (UNFRACTIONATED)     Status: None   Collection Time    01/19/13  6:17 PM      Result Value Range   Heparin Unfractionated 0.40  0.30 - 0.70 IU/mL   Comment:            IF HEPARIN RESULTS ARE BELOW     EXPECTED VALUES, AND PATIENT     DOSAGE HAS BEEN CONFIRMED,     SUGGEST FOLLOW UP TESTING     OF ANTITHROMBIN III LEVELS.  GLUCOSE, CAPILLARY     Status: None   Collection Time    01/19/13 10:22 PM      Result Value Range   Glucose-Capillary 89  70 - 99 mg/dL  CBC     Status: Abnormal   Collection Time    01/20/13  4:00 AM      Result Value Range   WBC 5.8  4.0 - 10.5 K/uL   RBC 3.98  3.87 - 5.11 MIL/uL   Hemoglobin 10.8 (*) 12.0 - 15.0 g/dL   HCT 16.1 (*) 09.6 - 04.5 %   MCV 81.9  78.0 - 100.0 fL   MCH 27.1  26.0 - 34.0 pg   MCHC 33.1  30.0 - 36.0 g/dL   RDW 40.9 (*) 81.1 - 91.4 %   Platelets 140 (*) 150 - 400 K/uL  HEPARIN LEVEL (UNFRACTIONATED)     Status: Abnormal   Collection Time    01/20/13  4:00 AM      Result Value Range   Heparin Unfractionated 0.25 (*) 0.30 - 0.70 IU/mL   Comment:            IF HEPARIN  RESULTS ARE BELOW     EXPECTED VALUES, AND PATIENT     DOSAGE HAS BEEN CONFIRMED,     SUGGEST FOLLOW UP TESTING     OF ANTITHROMBIN III LEVELS.  BASIC METABOLIC PANEL     Status: Abnormal   Collection Time    01/20/13  4:00 AM      Result Value Range   Sodium 141  135 - 145 mEq/L   Potassium 3.7  3.5 - 5.1 mEq/L   Chloride 106  96 - 112 mEq/L   CO2 24  19 - 32 mEq/L   Glucose, Bld 75  70 - 99 mg/dL   BUN 10  6 - 23 mg/dL   Creatinine, Ser 7.82  0.50 - 1.10 mg/dL   Calcium 9.3  8.4 - 95.6 mg/dL   GFR calc non Af Amer 85 (*) >90 mL/min   GFR calc Af Amer >90  >90 mL/min   Comment: (NOTE)     The eGFR has been calculated using the CKD EPI equation.     This calculation has not been validated in all clinical situations.     eGFR's persistently <90 mL/min signify possible Chronic Kidney     Disease.  HEPATIC FUNCTION PANEL     Status: Abnormal   Collection Time    01/20/13  4:00 AM      Result Value Range   Total Protein 6.3  6.0 - 8.3 g/dL   Albumin 3.1 (*) 3.5 - 5.2 g/dL   AST 23  0 - 37 U/L   ALT 10  0 - 35 U/L   Alkaline Phosphatase 77  39 - 117 U/L   Total Bilirubin 0.7  0.3 - 1.2 mg/dL   Bilirubin, Direct 0.2  0.0 - 0.3 mg/dL   Indirect Bilirubin 0.5  0.3 - 0.9 mg/dL  PRO B NATRIURETIC PEPTIDE     Status: Abnormal   Collection Time    01/20/13  4:00 AM      Result Value Range   Pro B Natriuretic peptide (BNP) 4067.0 (*) 0 - 125 pg/mL  GLUCOSE, CAPILLARY     Status: None   Collection Time    01/20/13  7:57 AM      Result Value Range   Glucose-Capillary 75  70 - 99 mg/dL    Imaging: Ct Angio Chest Pe W/cm &/or Wo Cm  01/18/2013   *RADIOLOGY REPORT*  Clinical Data: Elevated D-dimer, shortness of breath, chest pain, evaluate for pulmonary embolism  CT ANGIOGRAPHY CHEST  Technique:  Multidetector CT imaging of the chest using the standard protocol during bolus administration of intravenous contrast. Multiplanar reconstructed images including MIPs were obtained and  reviewed to evaluate the vascular anatomy.  Contrast: OMNIPAQUE IOHEXOL 350 MG/ML SOLN  Comparison: Chest radiograph - earlier same day  Vascular Findings:  There is adequate opacification of the pulmonary arterial system of the main pulmonary artery measuring 517 HU. There is a small nonocclusive bleb within an anterior segmental branch of the left upper lobe (image 102, series 5, coronal image 40 dB).  Otherwise, there are no discrete filling defects within the pulmonary arterial tree.  The caliber of the main pulmonary artery is enlarged measuring 34 mm in diameter.  Marked cardiomegaly, in particular, there is marked enlargement of the left ventricle.  Coronary artery calcifications.  Trace amount of presumably physiologic pericardial fluid.  While this examination was not tailored for evaluation of the thoracic aorta, there is fusiform aneurysmal dilatation of the ascending thoracic aorta measuring 41 mm in greatest oblique axial dimension (image 68, series four).  The thoracic aorta remains ectatic proximally measuring approximately 37 mm at the level of the aortic arch (image 43, series four).  There is a minimal amount of apparent crescentic mural thrombus within the cranial aspect of the descending thoracic aorta (image 55, series four).  While mid aspect of the descending thoracic aorta tapers to a near normal caliber (measuring approximately 30 mm in diameter - image 82, series four), the distal aspect of the descending thoracic aorta is tortuous and aneurysmal measuring approximately 43 mm at the level of the diaphragmatic hiatus.  There is apparent crescentic mural thrombus within the aneurysmal descending thoracic aorta (image 126, series four).  No definite periaortic stranding.  ---------------------------------------------------------  Nonvascular findings:  Small to moderate-sized bilateral pleural effusions with associated adjacent compressive atelectasis, right greater than left.  There is  rather diffuse mild interlobular septal thickening and ground glass opacification suggestive of pulmonary edema.  Mild centrilobular paraseptal emphysematous change is suspected.  There is a minimal amount of debris within distal aspect of the trachea extending to the right main stem bronchus.  The central pulmonary airways remain patent.  Scattered shoddy mediastinal lymph nodes are borderline enlarged with index precarinal node conglomeration measuring 1.1 cm in short axis diameter (image 56, series 4 and index prevascular node measuring 1.1 cm.  No definite hilar or axillary lymphadenopathy.  Limited early arterial phase evaluation of the upper abdomen demonstrates a left sided nephrolithiasis with prominent stone measuring 3.6 mm (image 132).  Incidental note of a small splenule.  No acute or aggressive osseous abnormalities.  Dystrophic calcifications within the right lobe of the  thyroid.  IMPRESSION:  1.  No acute pulmonary embolism.  Tiny nonocclusive pulmonary arterial web incidentally noted within an anterior segmental branch of the left upper lobe, likely the sequela of remote/prior pulmonary embolism.  2.  Findings suggestive of pulmonary edema with small bilateral pleural effusions and associated adjacent compressive atelectasis, right greater than left.  3.  Cardiomegaly.  Coronary calcifications. Enlarged caliber of the main pulmonary artery, nonspecific though may be seen in the setting of pulmonary arterial hypertension. Further evaluation with cardiac echo may be performed if clinically indicated.  4.  While this examination was not tailored for evaluation of the thoracic aorta, note is made of aneurysmal dilatation of the ascending thoracic aorta measuring approximately 41 mm extending through the aortic arch and proximal descending thoracic aorta. While the mid aspect of the thoracic spine tapers to a near normal caliber, the distal aspect of the descending thoracic aorta is tortuous and  aneurysmal measuring 43 mm in diameter and containing mural thrombus.  Non emergent referral to the thoracic surgery and dedicated TAA CT is recommended.  5.  Incidentally noted left-sided nephrolithiasis.  7.  Dystrophic calcifications in the right lobe of the thyroid. Further evaluation with dedicated thyroid ultrasound may be performed as clinically indicated.   Original Report Authenticated By: Tacey Ruiz, MD   Dg Chest Portable 1 View  01/18/2013   CLINICAL DATA:  Shortness of breath, smoker.  EXAM: PORTABLE CHEST - 1 VIEW  COMPARISON:  08/11/2010  FINDINGS: There is cardiomegaly. Diffuse interstitial and alveolar opacities throughout the lungs compatible with edema/ CHF. No visible effusions. No acute bony abnormality.  IMPRESSION: Mild to moderate CHF.   Electronically Signed   By: Charlett Nose   On: 01/18/2013 12:22   Ct Angio Chest Aortic Dissect W &/or W/o  01/19/2013   CLINICAL DATA:  Enlarged descending thoracic aorta on a PE protocol chest CTA obtained yesterday.  EXAM: CT ANGIOGRAPHY CHEST WITH CONTRAST  TECHNIQUE: Multidetector CT imaging of the chest was performed using the standard protocol during bolus administration of intravenous contrast. Multiplanar CT image reconstructions including MIPs were obtained to evaluate the vascular anatomy.  CONTRAST:  100 cc Omnipaque 350  COMPARISON:  Yesterday.  FINDINGS: The pulmonary arteries are better opacified than the thoracic aorta. Again demonstrated is fusiform aneurysmal dilatation of the descending thoracic aorta without dissection. There is a focal indentation in the posterior aspect of the descending thoracic aorta by the posterior aspect of the left hemidiaphragm with continued aneurysmal dilatation of the proximal abdominal aorta below the level of the diaphragm. No aortic dissection in the upper abdomen.  The ascending thoracic aorta measures 3.8 cm in maximum diameter at the level of the main pulmonary artery. The proximal descending  thoracic aorta measures 4.2 cm in maximum transverse diameter with mild concentric mural thrombus. The distal descending thoracic aorta measures 3.7 cm in maximum transverse diameter with mild concentric mural thrombus. The proximal abdominal aorta measures 4.6 cm in maximum transverse diameter with a moderate concentric mural thrombus. Slightly more inferiorly, above the level of the origin of the renal arteries, there is a greater degree of mural thrombus in the proximal abdominal aorta, with a maximum thickness of 1.7 cm.  Atheromatous arterial calcifications are noted, including dense coronary artery calcifications. Again noted is normal opacification of the pulmonary arteries with no pulmonary arterial filling defects seen.  Small to moderate-sized bilateral pleural effusions have not changed significantly. No significant change in associated bilateral lower lobe dependent atelectasis.  The majority of the esophagus remains moderately dilated with some dependent high density material. The dependent high density material was not previously present.  The heart remains enlarged. Mildly enlarged mediastinal lymph nodes are again demonstrated. These include a right paratracheal node with a short axis diameter of 2.0 cm on image number 20. An AP window lymph node has a short axis diameter of 9 mm on image number 21. Mild thoracic spine degenerative changes. Tiny bilateral renal calculi. Small right kidney. Left renal cyst. Previously noted small right lobe thyroid calcified nodules.  Review of the MIP images confirms the above findings.  IMPRESSION: 1. Stable fusiform aneurysmal dilatation of the descending thoracic aorta and proximal abdominal aorta, as described above. No dissection. 2. Stable small to moderate-sized bilateral pleural effusions and associated bilateral lower lobe atelectasis. 3. Stable mild mediastinal adenopathy. 4. Previously noted small right lobe thyroid calcified nodules. 5. Stable moderate  diffuse dilatation of the esophagus. 6. Stable tiny bilateral renal calculi, right renal atrophy and left renal cyst.   Electronically Signed   By: Gordan Payment   On: 01/19/2013 21:20    Assessment:  1. Principal Problem: 2.   NSTEMI (non-ST elevated myocardial infarction) 3. Active Problems: 4.   LBBB (left bundle branch block) 5.   Dyspnea, acute 6.   Tobacco abuse 7.   CAD in native artery, wth hx MI 15 yrs ago 8.   Aneurysmal dilatation both ascending and descending thoracic aorta wth mural thrombus 9.   Hyperglycemia 10.   Hypokalemia 11.   Thoracic aortic aneurysm 12.   Acute systolic congestive heart failure, NYHA class 2 13.   Plan:  1. CT Aortogram negative for dissection - mural thrombus noted. NSTEMI this admission. CT does show multivessel coronary calcification - EF 20-25% by echo with inferior akinesis (probably prior infarct). She will need cardiac catheterization to define her anatomy, which I suspect will be surgical. She has been NPO, will place her on for add-on case today with Dr. Herbie Baltimore.  She is agreeable to cardiac catheterization.  Time Spent Directly with Patient:  15 minutes  Length of Stay:  LOS: 2 days   Chrystie Nose, MD, [facility] Attending Cardiologist [facility]  HILTY,Kenneth C 01/20/2013, 9:13 AM

## 2013-01-20 NOTE — Consult Note (Signed)
[address]       [name] [address]             [phone]      Cardiothoracic Surgery Consultation:   Reason for Consult: Severe Left main and multivessel coronary disease with severe LV dysfunction  Referring Physician: Dr. Italy Hilty  Kathryn Dixon is an 67 y.o. female.  HPI:    The patient is a 67 year old black female smoker with HTN and a history of coronary artery disease, s/p MI 15-20 years ago. She has not followed up since. She says she was in her usual sedentary state with no symptoms until she woke up Tuesday with some shortness of breath and just did not feel right. She tried to go back to sleep but couldn't and developed worsening dyspnea prompting her to call EMS. She had no chest pain or discomfort. She ruled in for MI with POC troponin of 0.1. ECG showed LBBB that was not new. Subsequent troponin was 1.96. Her D-dimer was 11.6 and she had a CT pulmonary angiogram ruling out PE. It did show aneurysmal enlargement of her entire thoracic aorta. Cardiology was concerned about this and the possibility of dissection so cath was postponed and a CTA of the chest was done last night showing moderate enlargement of the thoracic aorta with no dissection. A 2D echo showed severe LV dysfunction with severe LVH and an EF of 20-25%. Right and left heart cath today shows severe pulmonary hypertension with borderline cardiac index of 2. There is 80% distal LM stenosis, 100% chronically occluded RCA with collaterals to the distal vessel and significant LCX disease. Today she has had no dyspnea or chest discomfort.   Past Medical History  Diagnosis Date  . Hypertension   . Coronary artery disease     Past Surgical History  Procedure Laterality Date  . Abdominal hysterectomy    . Fracture surgery      No family history on file.  Social History:  reports that she has been smoking Cigarettes.  She has been smoking about 1.50 packs per day. She has never used  smokeless tobacco. She reports that  drinks alcohol. She reports that she does not use illicit drugs.  Allergies: No Known Allergies  Medications:  I have reviewed the patient's current medications. Prior to Admission:  No prescriptions prior to admission   Scheduled: . aspirin EC  81 mg Oral Daily  . atorvastatin  10 mg Oral q1800  . furosemide  40 mg Intravenous BID  . insulin aspart  0-9 Units Subcutaneous TID WC  . losartan  25 mg Oral Daily  . sodium chloride  3 mL Intravenous Q12H   Continuous: . sodium chloride Stopped (01/18/13 1256)  . heparin     HQI:ONGEXB chloride, acetaminophen, morphine injection, nitroGLYCERIN, ondansetron (ZOFRAN) IV, sodium chloride Anti-infectives   None      Results for orders placed during the hospital encounter of 01/18/13 (from the past 48 hour(s))  MRSA PCR SCREENING     Status: None   Collection Time    01/18/13  8:54 PM      Result Value Range   MRSA by PCR NEGATIVE  NEGATIVE   Comment:            The GeneXpert MRSA Assay (FDA     approved for NASAL specimens     only), is one component of a     comprehensive MRSA colonization  surveillance program. It is not     intended to diagnose MRSA     infection nor to guide or     monitor treatment for     MRSA infections.  GLUCOSE, CAPILLARY     Status: None   Collection Time    01/18/13  9:24 PM      Result Value Range   Glucose-Capillary 92  70 - 99 mg/dL   Comment 1 Notify RN    TROPONIN I     Status: Abnormal   Collection Time    01/18/13 10:30 PM      Result Value Range   Troponin I 3.30 (*) <0.30 ng/mL   Comment:            Due to the release kinetics of cTnI,     a negative result within the first hours     of the onset of symptoms does not rule out     myocardial infarction with certainty.     If myocardial infarction is still suspected,     repeat the test at appropriate intervals.     REPEATED TO VERIFY     CRITICAL RESULT CALLED TO, READ BACK BY AND VERIFIED  WITH:     Earlean Polka RN [phone] 0004 GREEN R  TSH     Status: None   Collection Time    01/18/13 10:30 PM      Result Value Range   TSH 0.823  0.350 - 4.500 uIU/mL   Comment: Performed at [facility]  HEMOGLOBIN A1C     Status: None   Collection Time    01/18/13 10:30 PM      Result Value Range   Hemoglobin A1C 5.6  <5.7 %   Comment: (NOTE)                                                                               According to the ADA Clinical Practice Recommendations for 2011, when     HbA1c is used as a screening test:      >=6.5%   Diagnostic of Diabetes Mellitus               (if abnormal result is confirmed)     5.7-6.4%   Increased risk of developing Diabetes Mellitus     References:Diagnosis and Classification of Diabetes Mellitus,Diabetes     Care,2011,34(Suppl 1):S62-S69 and Standards of Medical Care in             Diabetes - 2011,Diabetes Care,2011,34 (Suppl 1):S11-S61.   Mean Plasma Glucose 114  <117 mg/dL   Comment: Performed at [facility]  HEPARIN LEVEL (UNFRACTIONATED)     Status: Abnormal   Collection Time    01/19/13  2:30 AM      Result Value Range   Heparin Unfractionated 0.16 (*) 0.30 - 0.70 IU/mL   Comment:            IF HEPARIN RESULTS ARE BELOW     EXPECTED VALUES, AND PATIENT     DOSAGE HAS BEEN CONFIRMED,     SUGGEST FOLLOW UP TESTING     OF ANTITHROMBIN III LEVELS.  TROPONIN I  Status: Abnormal   Collection Time    01/19/13  3:00 AM      Result Value Range   Troponin I 3.46 (*) <0.30 ng/mL   Comment:            Due to the release kinetics of cTnI,     a negative result within the first hours     of the onset of symptoms does not rule out     myocardial infarction with certainty.     If myocardial infarction is still suspected,     repeat the test at appropriate intervals.     REPEATED TO VERIFY     CRITICAL VALUE NOTED.  VALUE IS CONSISTENT WITH PREVIOUSLY REPORTED AND CALLED VALUE.  LIPID PANEL     Status: Abnormal    Collection Time    01/19/13  3:00 AM      Result Value Range   Cholesterol 169  0 - 200 mg/dL   Triglycerides 57  <161 mg/dL   HDL 55  >09 mg/dL   Total CHOL/HDL Ratio 3.1     VLDL 11  0 - 40 mg/dL   LDL Cholesterol 604 (*) 0 - 99 mg/dL   Comment:            Total Cholesterol/HDL:CHD Risk     Coronary Heart Disease Risk Table                         Men   Women      1/2 Average Risk   3.4   3.3      Average Risk       5.0   4.4      2 X Average Risk   9.6   7.1      3 X Average Risk  23.4   11.0                Use the calculated Patient Ratio     above and the CHD Risk Table     to determine the patient's CHD Risk.                ATP III CLASSIFICATION (LDL):      <100     mg/dL   Optimal      540-981  mg/dL   Near or Above                        Optimal      130-159  mg/dL   Borderline      191-478  mg/dL   High      >295     mg/dL   Very High  BASIC METABOLIC PANEL     Status: Abnormal   Collection Time    01/19/13  3:00 AM      Result Value Range   Sodium 140  135 - 145 mEq/L   Potassium 3.3 (*) 3.5 - 5.1 mEq/L   Chloride 106  96 - 112 mEq/L   CO2 24  19 - 32 mEq/L   Glucose, Bld 85  70 - 99 mg/dL   BUN 12  6 - 23 mg/dL   Creatinine, Ser 6.21  0.50 - 1.10 mg/dL   Calcium 9.2  8.4 - 30.8 mg/dL   GFR calc non Af Amer 89 (*) >90 mL/min   GFR calc Af Amer >90  >90 mL/min   Comment: (NOTE)     The eGFR  has been calculated using the CKD EPI equation.     This calculation has not been validated in all clinical situations.     eGFR's persistently <90 mL/min signify possible Chronic Kidney     Disease.  CBC     Status: Abnormal   Collection Time    01/19/13  3:00 AM      Result Value Range   WBC 9.9  4.0 - 10.5 K/uL   RBC 4.28  3.87 - 5.11 MIL/uL   Hemoglobin 11.5 (*) 12.0 - 15.0 g/dL   Comment: DELTA CHECK NOTED     REPEATED TO VERIFY   HCT 34.9 (*) 36.0 - 46.0 %   MCV 81.5  78.0 - 100.0 fL   MCH 26.9  26.0 - 34.0 pg   MCHC 33.0  30.0 - 36.0 g/dL   RDW 16.1 (*)  09.6 - 15.5 %   Platelets 130 (*) 150 - 400 K/uL  GLUCOSE, CAPILLARY     Status: None   Collection Time    01/19/13  9:16 AM      Result Value Range   Glucose-Capillary 81  70 - 99 mg/dL  GLUCOSE, CAPILLARY     Status: None   Collection Time    01/19/13 12:16 PM      Result Value Range   Glucose-Capillary 91  70 - 99 mg/dL  HEPARIN LEVEL (UNFRACTIONATED)     Status: None   Collection Time    01/19/13 12:30 PM      Result Value Range   Heparin Unfractionated 0.37  0.30 - 0.70 IU/mL   Comment:            IF HEPARIN RESULTS ARE BELOW     EXPECTED VALUES, AND PATIENT     DOSAGE HAS BEEN CONFIRMED,     SUGGEST FOLLOW UP TESTING     OF ANTITHROMBIN III LEVELS.  TROPONIN I     Status: Abnormal   Collection Time    01/19/13 12:30 PM      Result Value Range   Troponin I 1.99 (*) <0.30 ng/mL   Comment:            Due to the release kinetics of cTnI,     a negative result within the first hours     of the onset of symptoms does not rule out     myocardial infarction with certainty.     If myocardial infarction is still suspected,     repeat the test at appropriate intervals.     CRITICAL VALUE NOTED.  VALUE IS CONSISTENT WITH PREVIOUSLY REPORTED AND CALLED VALUE.  GLUCOSE, CAPILLARY     Status: None   Collection Time    01/19/13  4:42 PM      Result Value Range   Glucose-Capillary 86  70 - 99 mg/dL  HEPARIN LEVEL (UNFRACTIONATED)     Status: None   Collection Time    01/19/13  6:17 PM      Result Value Range   Heparin Unfractionated 0.40  0.30 - 0.70 IU/mL   Comment:            IF HEPARIN RESULTS ARE BELOW     EXPECTED VALUES, AND PATIENT     DOSAGE HAS BEEN CONFIRMED,     SUGGEST FOLLOW UP TESTING     OF ANTITHROMBIN III LEVELS.  GLUCOSE, CAPILLARY     Status: None   Collection Time    01/19/13 10:22 PM      Result  Value Range   Glucose-Capillary 89  70 - 99 mg/dL  CBC     Status: Abnormal   Collection Time    01/20/13  4:00 AM      Result Value Range   WBC 5.8  4.0  - 10.5 K/uL   RBC 3.98  3.87 - 5.11 MIL/uL   Hemoglobin 10.8 (*) 12.0 - 15.0 g/dL   HCT 16.1 (*) 09.6 - 04.5 %   MCV 81.9  78.0 - 100.0 fL   MCH 27.1  26.0 - 34.0 pg   MCHC 33.1  30.0 - 36.0 g/dL   RDW 40.9 (*) 81.1 - 91.4 %   Platelets 140 (*) 150 - 400 K/uL  HEPARIN LEVEL (UNFRACTIONATED)     Status: Abnormal   Collection Time    01/20/13  4:00 AM      Result Value Range   Heparin Unfractionated 0.25 (*) 0.30 - 0.70 IU/mL   Comment:            IF HEPARIN RESULTS ARE BELOW     EXPECTED VALUES, AND PATIENT     DOSAGE HAS BEEN CONFIRMED,     SUGGEST FOLLOW UP TESTING     OF ANTITHROMBIN III LEVELS.  BASIC METABOLIC PANEL     Status: Abnormal   Collection Time    01/20/13  4:00 AM      Result Value Range   Sodium 141  135 - 145 mEq/L   Potassium 3.7  3.5 - 5.1 mEq/L   Chloride 106  96 - 112 mEq/L   CO2 24  19 - 32 mEq/L   Glucose, Bld 75  70 - 99 mg/dL   BUN 10  6 - 23 mg/dL   Creatinine, Ser 7.82  0.50 - 1.10 mg/dL   Calcium 9.3  8.4 - 95.6 mg/dL   GFR calc non Af Amer 85 (*) >90 mL/min   GFR calc Af Amer >90  >90 mL/min   Comment: (NOTE)     The eGFR has been calculated using the CKD EPI equation.     This calculation has not been validated in all clinical situations.     eGFR's persistently <90 mL/min signify possible Chronic Kidney     Disease.  HEPATIC FUNCTION PANEL     Status: Abnormal   Collection Time    01/20/13  4:00 AM      Result Value Range   Total Protein 6.3  6.0 - 8.3 g/dL   Albumin 3.1 (*) 3.5 - 5.2 g/dL   AST 23  0 - 37 U/L   ALT 10  0 - 35 U/L   Alkaline Phosphatase 77  39 - 117 U/L   Total Bilirubin 0.7  0.3 - 1.2 mg/dL   Bilirubin, Direct 0.2  0.0 - 0.3 mg/dL   Indirect Bilirubin 0.5  0.3 - 0.9 mg/dL  PRO B NATRIURETIC PEPTIDE     Status: Abnormal   Collection Time    01/20/13  4:00 AM      Result Value Range   Pro B Natriuretic peptide (BNP) 4067.0 (*) 0 - 125 pg/mL  GLUCOSE, CAPILLARY     Status: None   Collection Time    01/20/13  7:57  AM      Result Value Range   Glucose-Capillary 75  70 - 99 mg/dL  HEPARIN LEVEL (UNFRACTIONATED)     Status: None   Collection Time    01/20/13 11:23 AM      Result Value Range  Heparin Unfractionated 0.49  0.30 - 0.70 IU/mL   Comment:            IF HEPARIN RESULTS ARE BELOW     EXPECTED VALUES, AND PATIENT     DOSAGE HAS BEEN CONFIRMED,     SUGGEST FOLLOW UP TESTING     OF ANTITHROMBIN III LEVELS.  GLUCOSE, CAPILLARY     Status: None   Collection Time    01/20/13 11:30 AM      Result Value Range   Glucose-Capillary 78  70 - 99 mg/dL  POCT I-STAT 3, BLOOD GAS (G3+)     Status: Abnormal   Collection Time    01/20/13  1:09 PM      Result Value Range   pH, Arterial 7.402  7.350 - 7.450   pCO2 arterial 30.9 (*) 35.0 - 45.0 mmHg   pO2, Arterial 42.0 (*) 80.0 - 100.0 mmHg   Bicarbonate 19.2 (*) 20.0 - 24.0 mEq/L   TCO2 20  0 - 100 mmol/L   O2 Saturation 78.0     Acid-base deficit 5.0 (*) 0.0 - 2.0 mmol/L   Sample type ARTERIAL    POCT I-STAT 3, BLOOD GAS (G3P V)     Status: Abnormal   Collection Time    01/20/13  1:14 PM      Result Value Range   pH, Ven 7.359 (*) 7.250 - 7.300   pCO2, Ven 37.4 (*) 45.0 - 50.0 mmHg   pO2, Ven 22.0 (*) 30.0 - 45.0 mmHg   Bicarbonate 21.1  20.0 - 24.0 mEq/L   TCO2 22  0 - 100 mmol/L   O2 Saturation 36.0     Acid-base deficit 4.0 (*) 0.0 - 2.0 mmol/L   Sample type VENOUS     Comment NOTIFIED PHYSICIAN    POCT I-STAT 3, BLOOD GAS (G3+)     Status: Abnormal   Collection Time    01/20/13  1:40 PM      Result Value Range   pH, Arterial 7.348 (*) 7.350 - 7.450   pCO2 arterial 37.2  35.0 - 45.0 mmHg   pO2, Arterial 53.0 (*) 80.0 - 100.0 mmHg   Bicarbonate 20.4  20.0 - 24.0 mEq/L   TCO2 22  0 - 100 mmol/L   O2 Saturation 85.0     Acid-base deficit 5.0 (*) 0.0 - 2.0 mmol/L   Sample type ARTERIAL    POCT ACTIVATED CLOTTING TIME     Status: None   Collection Time    01/20/13  1:56 PM      Result Value Range   Activated Clotting Time 135      GLUCOSE, CAPILLARY     Status: Abnormal   Collection Time    01/20/13  5:08 PM      Result Value Range   Glucose-Capillary 120 (*) 70 - 99 mg/dL    Ct Angio Chest Aortic Dissect W &/or W/o  01/19/2013   CLINICAL DATA:  Enlarged descending thoracic aorta on a PE protocol chest CTA obtained yesterday.  EXAM: CT ANGIOGRAPHY CHEST WITH CONTRAST  TECHNIQUE: Multidetector CT imaging of the chest was performed using the standard protocol during bolus administration of intravenous contrast. Multiplanar CT image reconstructions including MIPs were obtained to evaluate the vascular anatomy.  CONTRAST:  100 cc Omnipaque 350  COMPARISON:  Yesterday.  FINDINGS: The pulmonary arteries are better opacified than the thoracic aorta. Again demonstrated is fusiform aneurysmal dilatation of the descending thoracic aorta without dissection. There is a focal indentation  in the posterior aspect of the descending thoracic aorta by the posterior aspect of the left hemidiaphragm with continued aneurysmal dilatation of the proximal abdominal aorta below the level of the diaphragm. No aortic dissection in the upper abdomen.  The ascending thoracic aorta measures 3.8 cm in maximum diameter at the level of the main pulmonary artery. The proximal descending thoracic aorta measures 4.2 cm in maximum transverse diameter with mild concentric mural thrombus. The distal descending thoracic aorta measures 3.7 cm in maximum transverse diameter with mild concentric mural thrombus. The proximal abdominal aorta measures 4.6 cm in maximum transverse diameter with a moderate concentric mural thrombus. Slightly more inferiorly, above the level of the origin of the renal arteries, there is a greater degree of mural thrombus in the proximal abdominal aorta, with a maximum thickness of 1.7 cm.  Atheromatous arterial calcifications are noted, including dense coronary artery calcifications. Again noted is normal opacification of the pulmonary arteries  with no pulmonary arterial filling defects seen.  Small to moderate-sized bilateral pleural effusions have not changed significantly. No significant change in associated bilateral lower lobe dependent atelectasis. The majority of the esophagus remains moderately dilated with some dependent high density material. The dependent high density material was not previously present.  The heart remains enlarged. Mildly enlarged mediastinal lymph nodes are again demonstrated. These include a right paratracheal node with a short axis diameter of 2.0 cm on image number 20. An AP window lymph node has a short axis diameter of 9 mm on image number 21. Mild thoracic spine degenerative changes. Tiny bilateral renal calculi. Small right kidney. Left renal cyst. Previously noted small right lobe thyroid calcified nodules.  Review of the MIP images confirms the above findings.  IMPRESSION: 1. Stable fusiform aneurysmal dilatation of the descending thoracic aorta and proximal abdominal aorta, as described above. No dissection. 2. Stable small to moderate-sized bilateral pleural effusions and associated bilateral lower lobe atelectasis. 3. Stable mild mediastinal adenopathy. 4. Previously noted small right lobe thyroid calcified nodules. 5. Stable moderate diffuse dilatation of the esophagus. 6. Stable tiny bilateral renal calculi, right renal atrophy and left renal cyst.   Electronically Signed   By: Gordan Payment   On: 01/19/2013 21:20    Review of Systems  Constitutional: Negative for fever, chills, weight loss, malaise/fatigue and diaphoresis.  HENT: Negative.   Eyes: Negative.   Respiratory: Positive for shortness of breath and wheezing. Negative for cough, hemoptysis and sputum production.   Cardiovascular: Positive for leg swelling. Negative for chest pain, palpitations, orthopnea, claudication and PND.  Gastrointestinal: Negative.   Genitourinary: Negative.   Musculoskeletal: Negative.   Skin: Negative.     Neurological: Negative.  Negative for weakness.  Endo/Heme/Allergies: Negative.   Psychiatric/Behavioral: Negative.    Blood pressure 119/75, pulse 69, temperature 98.7 F (37.1 C), temperature source Oral, resp. rate 22, height 5\' 4"  (1.626 m), weight 54 kg (119 lb 0.8 oz), SpO2 99.00%. Physical Exam  Constitutional: She is oriented to person, place, and time. She appears well-developed and well-nourished. No distress.  HENT:  Head: Atraumatic.  Mouth/Throat: Oropharynx is clear and moist.  Eyes: EOM are normal. Pupils are equal, round, and reactive to light.  Neck: Normal range of motion. Neck supple. No JVD present. No thyromegaly present.  Cardiovascular: Normal rate, regular rhythm and intact distal pulses.  Exam reveals gallop. Exam reveals no friction rub.   No murmur heard. Respiratory: Effort normal. She has no wheezes. She has rales.  GI: Soft. She exhibits no  distension and no mass. There is no tenderness.  Musculoskeletal: Normal range of motion. She exhibits no edema and no tenderness.  Lymphadenopathy:    She has no cervical adenopathy.  Neurological: She is alert and oriented to person, place, and time. She has normal strength. No cranial nerve deficit or sensory deficit.  Skin: Skin is warm and dry.  Psychiatric: She has a normal mood and affect.   Cardiac Cath:    FINDINGS:  Hemodynamics:  Findings:   SaO2%  Pressures mmHg  Mean P  mmHg  EDP  mmHg   Right Atrium   13/7   7   Right Ventricle   63/9   9   Pulmonary Artery  36% (RA)  60/33  48    PCWP   35/40  32-36    Transpulmonary gradient    12-16    Central Aortic  78% (RA)  152/96  101    Left Ventricle   151/25   36    following 40 mg IV Lasix  147/20   27    Cardiac Output:   Cardiac Index:    Fick  3.4   2.16    Thermodilution  3.0   1.9    Left Ventriculography: Not done due to possible LV apical thrombus - by Echo EF 20-25%  Coronary Anatomy:  Left Main: Large caliber vessel with a tortuous  course that has a distal ~70-80% stenosis followed by an extremely aneurysmal T-point where the LAD and Circumflex arise.  LAD: Large caliber, diffusely calcified and ectatic vessel with extremely tortuous/corkscrew proximal course. It gives rise to small first and second diagonal branches. There are extensive septal collaterals which fill the Right Posterior Descending Artery (RPDA).  There is a diffuse 40% stenosis followed by a focal roughly 70% focal stenosis at the beginning of what appears to be a healed dissection prior to a large distal third diagonal branch branch. This region is relatively long, and after 70% stenosis has minimal disease until just prior to D3 where there appears to be the other end of the healed dissection. Beyond D3 the vessel wraps around the apex providing distal collaterals to the distal RPDA.  D3: Distal, major branch that is moderate caliber cover the distal anterolateral wall. No significant disease. Left Circumflex: This is a large caliber ectatic vessel with a proximal 60% stenosis. There is extremely tortuous course where the vessel then bifurcates into a large lateral OM in the AV groove circumflex. The AV groove circumflex is proximally occluded after ectatic segment, with bridging collaterals to the distal portion that then fills the distal right posterior lateral system.  OM1: This is also a large caliber, ectatic vessel with a proximal 70% lesion. The vessel itself bifurcates into what appears to be at least 2 major marginal branches but is subtotally occluded prior to this. There is TIMI 1 flow into 2 distal branches that fill via collaterals from several small diagonal branches including D3. RCA: 100 % chronic proximal total occlusion.  RPDA: fills via L-R septal perforators  RPL Sysytem:The RPAV fills via diffusely diseased AVgroove Circumflex collaterals PATIENT DISPOSITION:  The patient was transferred to the PACU holding area in a hemodynamicaly stable,  chest pain free condition.  The patient tolerated the procedure well, and there were no complications. EBL: < 10 ml  The patient was stable before, during, and after the procedure. POST-OPERATIVE DIAGNOSIS:  Severe ischemic cardiomyopathy with known ejection fraction of 20-25% that correlates with significantly decreased cardiac  output and index with an average output of 3.2 and index of 2.  Significantly elevated Systemic Vascular Resistance, with SVR of 3000.  Severe pulmonary attention was transpulmonary gradient is borderline suggestive of possible mixed primary pulmonary and LV failure related pulmonary venous congestion. PVR was 427 Severe aneurysmal/ectatic coronary disease with 100% chronically occluded RCA, 80% distal left main followed by a large aneurysmal segment, 100% occlusion of the AV groove circumflex, and chronic total occlusion of a large lateral OM. There is also moderate to severe disease in the distal LAD.  None dilation of the ascending aorta as well as aortic arch and descending aorta into the abdomen. PLAN OF CARE:  Will transfer the patient to the TCU.  Initiate afterload reduction with ARB. We'll also initiate IV diuresis. She was given 40 mg IV Lasix in the Cath Lab.  CT surgery has already been counseled to do to the thoracic aneurysm, they will now see the patient post catheterization. My expectation is that she would be considered to be a very high-risk patient for surgical options. There are, however no valid percutaneous options.  Do to her potential for ongoing ischemia, she would not be a great candidate for inotropic assistance with her heart failure.  Would consider Life Vest on discharge, if there are thoughts of potential ICD placement in the future. If she does get ICD she would need to by the ICU due to her LBBB. Marykay Lex, M.D., M.S.  [facility]  [address]  [place], [place] [address]  [phone]    01/20/2013  2:19 PM     CLINICAL DATA: Enlarged descending thoracic aorta on a PE protocol  chest CTA obtained yesterday.  EXAM:  CT ANGIOGRAPHY CHEST WITH CONTRAST  TECHNIQUE:  Multidetector CT imaging of the chest was performed using the  standard protocol during bolus administration of intravenous  contrast. Multiplanar CT image reconstructions including MIPs were  obtained to evaluate the vascular anatomy.  CONTRAST: 100 cc Omnipaque 350  COMPARISON: Yesterday.  FINDINGS:  The pulmonary arteries are better opacified than the thoracic aorta.  Again demonstrated is fusiform aneurysmal dilatation of the  descending thoracic aorta without dissection. There is a focal  indentation in the posterior aspect of the descending thoracic aorta  by the posterior aspect of the left hemidiaphragm with continued  aneurysmal dilatation of the proximal abdominal aorta below the  level of the diaphragm. No aortic dissection in the upper abdomen.  The ascending thoracic aorta measures 3.8 cm in maximum diameter at  the level of the main pulmonary artery. The proximal descending  thoracic aorta measures 4.2 cm in maximum transverse diameter with  mild concentric mural thrombus. The distal descending thoracic aorta  measures 3.7 cm in maximum transverse diameter with mild concentric  mural thrombus. The proximal abdominal aorta measures 4.6 cm in  maximum transverse diameter with a moderate concentric mural  thrombus. Slightly more inferiorly, above the level of the origin of  the renal arteries, there is a greater degree of mural thrombus in  the proximal abdominal aorta, with a maximum thickness of 1.7 cm.  Atheromatous arterial calcifications are noted, including dense  coronary artery calcifications. Again noted is normal opacification  of the pulmonary arteries with no pulmonary arterial filling defects  seen.  Small to moderate-sized bilateral pleural effusions have not changed   significantly. No significant change in associated bilateral lower  lobe dependent atelectasis. The majority of the esophagus remains  moderately dilated  with some dependent high density material. The  dependent high density material was not previously present.  The heart remains enlarged. Mildly enlarged mediastinal lymph nodes  are again demonstrated. These include a right paratracheal node with  a short axis diameter of 2.0 cm on image number 20. An AP window  lymph node has a short axis diameter of 9 mm on image number 21.  Mild thoracic spine degenerative changes. Tiny bilateral renal  calculi. Small right kidney. Left renal cyst. Previously noted small  right lobe thyroid calcified nodules.  Review of the MIP images confirms the above findings.  IMPRESSION:  1. Stable fusiform aneurysmal dilatation of the descending thoracic  aorta and proximal abdominal aorta, as described above. No  dissection.  2. Stable small to moderate-sized bilateral pleural effusions and  associated bilateral lower lobe atelectasis.  3. Stable mild mediastinal adenopathy.  4. Previously noted small right lobe thyroid calcified nodules.  5. Stable moderate diffuse dilatation of the esophagus.  6. Stable tiny bilateral renal calculi, right renal atrophy and left  renal cyst.  Electronically Signed  By: Gordan Payment  On: 01/19/2013 21:20   *[place]* *[facility]* [address] [place], [place] [address] [phone]  ------------------------------------------------------------ Transthoracic Echocardiography  Patient: Kathryn, Dixon MR #: [id number] Study Date: 01/19/2013 Gender: F Age: 70 Height: 165.1cm Weight: 54.4kg BSA: 1.51m^2 Pt. Status: Room: 2C13C  ADMITTING Nicki Guadalajara ATTENDING Nicki Guadalajara PERFORMING Shvc ORDERING Wilburt Finlay SONOGRAPHER Jimmy Reel cc:  ------------------------------------------------------------ LV EF: 20% -  25%  ------------------------------------------------------------ Indications: CHF - 428.0. Dyspnea 786.09.  ------------------------------------------------------------ Study Conclusions  - Left ventricle: The cavity size was normal. There was severe concentric hypertrophy. Systolic function was severely reduced. The estimated ejection fraction was in the range of 20% to 25%. There is severe global hypokinesis with regional variation. The inferior wall and apex are akinetic. There is extracardiac doppler signals indicating flow during systole at the apex of the LV - it is not clear if this may represent a true apical LV aneurysm, although aneurysmal sac is not appreciated. Apical mural thrombus is suspected. Doppler parameters are consistent with abnormal left ventricular relaxation (grade 1 diastolic dysfunction). The E/e' ratio is >15, suggesting markedly elevated LV filling pressure. - Aortic valve: Normal appearing leaflets with poor central coaptation. Mild centralregurgitation. - Aorta: The ascending aorta appears dilated, measuring up to 4.5 cm or greater at the most distally visulized segment of the proximal aortic root. There is a small jet of systolic flow noted at the distal aortic arch during systole. This is in the location of the ductus arteriosus, but flow is only during systole (not througout the cardiac cycle), making PDA less likely. It could also represent dissection flap or small aortic rupture. Dedicated aortic CT angiogram is recommended. - Mitral valve: Calcified annulus. Mildly thickened leaflets . Moderate regurgitation, posteriorly directed likely secondary to an ischemically tethered posterior leaflet. - Left atrium: Severely dilated (51.6 ml/m2). - Tricuspid valve: Moderate regurgitation. - Pulmonary arteries: PA peak pressure: 31mm Hg (S). - Inferior vena cava: The vessel was normal in size; the respirophasic diameter changes were in the normal  range (= 50%); findings are consistent with normal central venous pressure. - Pericardium, extracardiac: Asmall posteriorpericardial effusion was identified. Transthoracic echocardiography. M-mode, complete 2D, spectral Doppler, and color Doppler. Height: Height: 165.1cm. Height: 65in. Weight: Weight: 54.4kg. Weight: 119.8lb. Body mass index: BMI: 20kg/m^2. Body surface area: BSA: 1.31m^2. Blood pressure: 101/62. Patient status: Inpatient. Location: Bedside.  ------------------------------------------------------------  ------------------------------------------------------------ Left  ventricle: The cavity size was normal. There was severe concentric hypertrophy. Systolic function was severely reduced. The estimated ejection fraction was in the range of 20% to 25%. There is severe global hypokinesis with regional variation. The inferior wall and apex are akinetic. There is extracardiac doppler signals indicating flow during systole at the apex of the LV - it is not clear if this may represent a true apical LV aneurysm, although aneurysmal sac is not appreciated. Apical mural thrombus is suspected. Doppler parameters are consistent with abnormal left ventricular relaxation (grade 1 diastolic dysfunction). The E/e' ratio is >15, suggesting markedly elevated LV filling pressure.  ------------------------------------------------------------ Aortic valve: Normal appearing leaflets with poor central coaptation. Doppler: Mild centralregurgitation.  ------------------------------------------------------------ Aorta: The ascending aorta appears dilated, measuring up to 4.5 cm or greater at the most distally visulized segment of the proximal aortic root. There is a small jet of systolic flow noted at the distal aortic arch during systole. This is in the location of the ductus arteriosus, but flow is only during systole (not througout the cardiac cycle), making PDA less likely. It could  also represent dissection flap or small aortic rupture. Dedicated aortic CT angiogram is recommended.  ------------------------------------------------------------ Mitral valve: Calcified annulus. Mildly thickened leaflets . Doppler: Moderate regurgitation, posteriorly directed likely secondary to an ischemically tethered posterior leaflet. Peak gradient: 3mm Hg (D).  ------------------------------------------------------------ Left atrium: Severely dilated (51.6 ml/m2).  ------------------------------------------------------------ Right ventricle: The cavity size was normal. The moderator band was prominent. Systolic function was normal.  ------------------------------------------------------------ Pulmonic valve: Poorly visualized. Doppler: Trivial regurgitation.  ------------------------------------------------------------ Tricuspid valve: Mildly thickened leaflets. Leaflet separation was normal. Doppler: Transvalvular velocity was within the normal range. Moderate regurgitation.  ------------------------------------------------------------ Pulmonary artery: Poorly visualized.  ------------------------------------------------------------ Right atrium: The atrium was normal in size.  ------------------------------------------------------------ Pericardium: Asmall posteriorpericardial effusion was identified.  ------------------------------------------------------------ Systemic veins: Inferior vena cava: The vessel was normal in size; the respirophasic diameter changes were in the normal range (= 50%); findings are consistent with normal central venous pressure.  ------------------------------------------------------------ Post procedure conclusions Ascending Aorta:  - The ascending aorta appears dilated, measuring up to 4.5 cm or greater at the most distally visulized segment of the proximal aortic root. There is a small jet of systolic flow noted at the distal  aortic arch during systole. This is in the location of the ductus arteriosus, but flow is only during systole (not througout the cardiac cycle), making PDA less likely. It could also represent dissection flap or small aortic rupture. Dedicated aortic CT angiogram is recommended.  ------------------------------------------------------------  2D measurements Normal Doppler Normal Left ventricle measurements LVID ED, 44.3 mm 43-52 Main pulmonary chord, artery PLAX Pressure, S 31 mm =30 LVID ES, 39.6 mm 23-38 Hg chord, Left ventricle PLAX Ea, lat 5.44 cm/ ------- FS, 11 % >29 ann, tiss s chord, DP PLAX E/Ea, lat 16.49 ------- LVPW, ED 15.8 mm ------ ann, tiss IVS/LVPW 0.84 <1.3 DP ratio, ED Ea, med 2.18 cm/ ------- Ventricular septum ann, tiss s IVS, ED 13.2 mm ------ DP Aorta E/Ea, med 41.15 ------- Root 31 mm ------ ann, tiss diam, ED DP Root ML 44.73 mm ------ Aortic valve diam, ED Regurg PHT 655 ms ------- Diam bet 44.96 mm 20-36 Mitral valve innom, Peak E vel 89.7 cm/ ------- LCC s Left atrium Peak A vel 110 cm/ ------- AP dim 46 mm ------ s AP dim 2.89 cm/m^2 <2.2 Deceleratio 155 ms 150-230 index n time Peak 3 mm ------- gradient, D Hg Peak E/A 0.8 ------- ratio Tricuspid  valve Regurg peak 257 cm/ ------- vel s Peak RV-RA 26 mm ------- gradient, S Hg Max regurg 257 cm/ ------- vel s Systemic veins Estimated 5 mm ------- CVP Hg Right ventricle Pressure, S 31 mm <30 Hg Sa vel, lat 10 cm/ ------- ann, tiss s DP  ------------------------------------------------------------ Prepared and Electronically Authenticated by  Zoila Shutter 2014-08-27T14:21:[id number]   Assessment/Plan:   She has 80% LM and multivessel coronary disease with severe LV dysfunction presenting with acute on chronic systolic heart failure after acute MI. She never had any chest pain but troponin and BNP were both elevated. I think CABG is indicated to try to prevent further ischemia  and infarction and potentially improve her cardiac function. She chooses to live a sedentary lifestyle and if she continues to smoke she will have a poor prognosis regardless. She did have pulmonary edema on cxr at admission and it would be best to try to resolve her CHF prior to doing surgery. She has mild-moderate enlargement of her entire thoracic aorta and I don't think this will require treatment at this time. She did have moderate MR by echo but I don't hear a murmur. This is likely ischemic in origin and can be evaluated with TEE in the OR. With her severe LV dysfunction we would like to keep her operation as simple as possible. I will follow up and decide on a surgical date next week. Alleen Borne 01/20/2013, 8:52 PM

## 2013-01-20 NOTE — Progress Notes (Addendum)
Pt transferred from cath lab by RN, on 2L VSS. Denies pain, right groin site is clean, dry, intact at level 0. Family present and updated re: cardiac cath. Pt attempting liquids now, will advance diet as she tolerates.   Delynn Flavin, RN, BSN

## 2013-01-20 NOTE — Progress Notes (Signed)
Dr.Kelly updated on the CT angiography chest with contrast results. No new Orders received.

## 2013-01-20 NOTE — Progress Notes (Signed)
ANTICOAGULATION CONSULT NOTE   Pharmacy Consult for heparin Indication: chest pain/ACS  No Known Allergies  Patient Measurements: Height: 5\' 4"  (162.6 cm) Weight: 119 lb 0.8 oz (54 kg) IBW/kg (Calculated) : 54.7  Vital Signs: Temp: 98.3 F (36.8 C) (08/28 1131) Temp src: Oral (08/28 1131) BP: 135/102 mmHg (08/28 1200) Pulse Rate: 91 (08/28 1240)  Labs:  Recent Labs  01/18/13 1136  01/18/13 1216 01/18/13 1218  01/18/13 2230  01/19/13 0300 01/19/13 1230 01/19/13 1817 01/20/13 0400 01/20/13 1123  HGB 12.7  --   --  15.3*  --   --   --  11.5*  --   --  10.8*  --   HCT 37.9  --   --  45.0  --   --   --  34.9*  --   --  32.6*  --   PLT 164  --   --   --   --   --   --  130*  --   --  140*  --   APTT  --   --  28  --   --   --   --   --   --   --   --   --   LABPROT  --   --  14.0  --   --   --   --   --   --   --   --   --   INR  --   --  1.10  --   --   --   --   --   --   --   --   --   HEPARINUNFRC  --   --   --   --   < >  --   < >  --  0.37 0.40 0.25* 0.49  CREATININE 0.67  --   --  0.70  --   --   --  0.65  --   --  0.74  --   TROPONINI  --   < > <0.30  --   --  3.30*  --  3.46* 1.99*  --   --   --   < > = values in this interval not displayed.  Estimated Creatinine Clearance: 57.4 ml/min (by C-G formula based on Cr of 0.74).  Assessment: 67 yo female with chest pain and elevated cardiac enzymes continues on heparin gtt. Also noted to have a mural thrombus.   Patient now s/p cath found to have severe CAD not amenable to pci. Patient also likely to be high risk for cardiac surgery d/t thoracic aneurysm. Orders to restart heparin tonight.  Goal of Therapy:  Heparin level 0.3-0.7 units/ml Monitor platelets by anticoagulation protocol: Yes   Plan:  1. Restart heparin drip at 1000 units/hr 2. Daily HL/CBC  Sheppard Coil PharmD., BCPS Clinical Pharmacist Pager [phone] 01/20/2013 3:47 PM

## 2013-01-21 ENCOUNTER — Inpatient Hospital Stay (HOSPITAL_COMMUNITY): Payer: Medicare Other

## 2013-01-21 ENCOUNTER — Other Ambulatory Visit: Payer: Self-pay | Admitting: *Deleted

## 2013-01-21 DIAGNOSIS — E785 Hyperlipidemia, unspecified: Secondary | ICD-10-CM | POA: Diagnosis present

## 2013-01-21 DIAGNOSIS — I214 Non-ST elevation (NSTEMI) myocardial infarction: Secondary | ICD-10-CM

## 2013-01-21 DIAGNOSIS — I5041 Acute combined systolic (congestive) and diastolic (congestive) heart failure: Secondary | ICD-10-CM

## 2013-01-21 DIAGNOSIS — Z01810 Encounter for preprocedural cardiovascular examination: Secondary | ICD-10-CM

## 2013-01-21 DIAGNOSIS — I2510 Atherosclerotic heart disease of native coronary artery without angina pectoris: Secondary | ICD-10-CM

## 2013-01-21 DIAGNOSIS — N390 Urinary tract infection, site not specified: Secondary | ICD-10-CM | POA: Diagnosis present

## 2013-01-21 DIAGNOSIS — I255 Ischemic cardiomyopathy: Secondary | ICD-10-CM | POA: Diagnosis present

## 2013-01-21 LAB — BASIC METABOLIC PANEL
BUN: 16 mg/dL (ref 6–23)
CO2: 27 mEq/L (ref 19–32)
Calcium: 9.6 mg/dL (ref 8.4–10.5)
Chloride: 100 mEq/L (ref 96–112)
Creatinine, Ser: 1 mg/dL (ref 0.50–1.10)
GFR calc Af Amer: 66 mL/min — ABNORMAL LOW (ref >90)
GFR calc non Af Amer: 57 mL/min — ABNORMAL LOW (ref >90)
Glucose, Bld: 97 mg/dL (ref 70–99)
Potassium: 3.5 mEq/L (ref 3.5–5.1)
Sodium: 138 mEq/L (ref 135–145)

## 2013-01-21 LAB — HEPARIN LEVEL (UNFRACTIONATED)
Heparin Unfractionated: 0.11 IU/mL — ABNORMAL LOW (ref 0.30–0.70)
Heparin Unfractionated: 0.31 IU/mL (ref 0.30–0.70)

## 2013-01-21 LAB — GLUCOSE, CAPILLARY: Glucose-Capillary: 133 mg/dL — ABNORMAL HIGH (ref 70–99)

## 2013-01-21 LAB — CBC
HCT: 36.7 % (ref 36.0–46.0)
Hemoglobin: 12.1 g/dL (ref 12.0–15.0)
MCH: 27 pg (ref 26.0–34.0)
MCHC: 33 g/dL (ref 30.0–36.0)
MCV: 81.9 fL (ref 78.0–100.0)
Platelets: 152 10*3/uL (ref 150–400)
RBC: 4.48 MIL/uL (ref 3.87–5.11)
RDW: 18.7 % — ABNORMAL HIGH (ref 11.5–15.5)
WBC: 5.4 10*3/uL (ref 4.0–10.5)

## 2013-01-21 MED ORDER — WHITE PETROLATUM GEL
Status: AC
  Administered 2013-01-21: 0.2
  Filled 2013-01-21: qty 5

## 2013-01-21 MED ORDER — CIPROFLOXACIN HCL 250 MG PO TABS
250.0000 mg | ORAL_TABLET | Freq: Two times a day (BID) | ORAL | Status: AC
  Administered 2013-01-21 – 2013-01-23 (×6): 250 mg via ORAL
  Filled 2013-01-21 (×7): qty 1

## 2013-01-21 MED ORDER — ALBUTEROL SULFATE (5 MG/ML) 0.5% IN NEBU
2.5000 mg | INHALATION_SOLUTION | Freq: Once | RESPIRATORY_TRACT | Status: AC
  Administered 2013-01-21: 2.5 mg via RESPIRATORY_TRACT

## 2013-01-21 NOTE — Progress Notes (Signed)
eLink Physician-Brief Progress Note Patient Name: Kathryn Dixon DOB: 1946-02-15 MRN: [id number]  Date of Service  01/21/2013   HPI/Events of Note  Patient with e coli UTI - uncomplicated   eICU Interventions  Plan: Cipro 250 mg po bid times 3 days   Intervention Category Intermediate Interventions: Infection - evaluation and management  Rikki Smestad 01/21/2013, 3:53 AM

## 2013-01-21 NOTE — Progress Notes (Signed)
ANTICOAGULATION CONSULT NOTE - Follow Up Consult  Pharmacy Consult for heparin Indication: CAD awaiting CABG  Labs:  Recent Labs  01/18/13 1216  01/18/13 2230  01/19/13 0300 01/19/13 1230  01/20/13 0400 01/20/13 1123 01/21/13 0418  HGB  --   < >  --   --  11.5*  --   --  10.8*  --  12.1  HCT  --   < >  --   --  34.9*  --   --  32.6*  --  36.7  PLT  --   --   --   --  130*  --   --  140*  --  152  APTT 28  --   --   --   --   --   --   --   --   --   LABPROT 14.0  --   --   --   --   --   --   --   --   --   INR 1.10  --   --   --   --   --   --   --   --   --   HEPARINUNFRC  --   < >  --   < >  --  0.37  < > 0.25* 0.49 0.11*  CREATININE  --   < >  --   --  0.65  --   --  0.74  --  1.00  TROPONINI <0.30  --  3.30*  --  3.46* 1.99*  --   --   --   --   < > = values in this interval not displayed.   Assessment: 67yo female subtherapeutic on heparin after resumed post-cath, now awaiting decision regarding OHS.  Goal of Therapy:  Heparin level 0.3-0.7 units/ml   Plan:  Will increase heparin gtt to 1100 units/hr where pt was previously therapeutic and check level in 6-8hr.  Vernard Gambles, PharmD, BCPS  01/21/2013,5:44 AM

## 2013-01-21 NOTE — Progress Notes (Signed)
ANTICOAGULATION CONSULT NOTE - Follow Up Consult  Pharmacy Consult for heparin Indication: CAD awaiting CABG  Labs:  Recent Labs  01/18/13 2230  01/19/13 0300 01/19/13 1230  01/20/13 0400 01/20/13 1123 01/21/13 0418 01/21/13 1300  HGB  --   < > 11.5*  --   --  10.8*  --  12.1  --   HCT  --   --  34.9*  --   --  32.6*  --  36.7  --   PLT  --   --  130*  --   --  140*  --  152  --   HEPARINUNFRC  --   < >  --  0.37  < > 0.25* 0.49 0.11* 0.31  CREATININE  --   --  0.65  --   --  0.74  --  1.00  --   TROPONINI 3.30*  --  3.46* 1.99*  --   --   --   --   --   < > = values in this interval not displayed.   Assessment: 67yo female s/p cath on heparin and plans for CABG (also noted with mural thrombus in the proximal abdominal aorta).  Heparin level is now at goal (HL= 0.31) after increase to 1100 units/hr. Prior to cath patient was at goal on the same rate.  Goal of Therapy:  Heparin level 0.3-0.7 units/ml   Plan:  -No heparin changes needed -Heparin level and CBC in am  Harland German, Pharm D 01/21/2013 2:41 PM

## 2013-01-21 NOTE — Progress Notes (Signed)
67 y/o AAF - DM-2, Chronic smoker, HTN & reported h/o CAD p/w Dyspnea --> + BNP & Troponin (NSTEMI). Also noted to have aneurysmal dilation of T & A Aorta --> EF ~20-25% with Inferiori Akinesis c/w scar. --> Cath done 8/28 (see report), MV Dz (LM 80%, aneurysmal junction of LAD/Cx, RCA CTO, Cx - both AVG & OM occluded with distal OMs fed via L-L collaterals).  Subjective:  Feels fine today - no c/o SOB or dyspnea.  UA o/n noted ++ WBC & Leukocytes - Foley d/c'd & started on Cipro ( per Cx & Sens). No CP.    Objective:  Vital Signs in the last 24 hours: Temp:  [97.8 F (36.6 C)-98.9 F (37.2 C)] 98.4 F (36.9 C) (08/29 0757) Pulse Rate:  [66-92] 66 (08/29 0800) Resp:  [12-25] 20 (08/29 0800) BP: (88-148)/(53-102) 103/59 mmHg (08/29 0800) SpO2:  [97 %-100 %] 100 % (08/29 0757) Weight:  [116 lb 6.5 oz (52.8 kg)-119 lb 0.8 oz (54 kg)] 116 lb 6.5 oz (52.8 kg) (08/29 0400)  Intake/Output from previous day: 08/28 0701 - 08/29 0700 In: 676.6 [I.V.:676.6] Out: 3000 [Urine:3000] Intake/Output from this shift: Total I/O In: 24 [I.V.:24] Out: -   Physical Exam: General appearance: alert, cooperative and appears older than stated age.  Mildly undernourished. Neck: JVD - a few cm above sternal notch, no adenopathy, no carotid bruit and supple, symmetrical, trachea midline Lungs: mild basal rales, otw simply prolonged expiratory phase  Heart: prominent apical impulse, regular rate and rhythm, S1, S2 normal, S3 present, S4 present, systolic murmur: holosystolic 1/6, blowing at lower left sternal border, at apex, no click and no rub Abdomen: soft, non-tender; bowel sounds normal; no masses,  no organomegaly Extremities: extremities normal, atraumatic, no cyanosis or edema, no edema, redness or tenderness in the calves or thighs and no ulcers, gangrene or trophic changes Pulses: mildly diminished BLE Neurologic: Mental status: Alert, oriented, thought content appropriate Cranial nerves:  normal  Lab Results:  Recent Labs  01/20/13 0400 01/21/13 0418  WBC 5.8 5.4  HGB 10.8* 12.1  PLT 140* 152    Recent Labs  01/20/13 0400 01/21/13 0418  NA 141 138  K 3.7 3.5  CL 106 100  CO2 24 27  GLUCOSE 75 97  BUN 10 16  CREATININE 0.74 1.00    Recent Labs  01/19/13 0300 01/19/13 1230  TROPONINI 3.46* 1.99*   Hepatic Function Panel  Recent Labs  01/20/13 0400  PROT 6.3  ALBUMIN 3.1*  AST 23  ALT 10  ALKPHOS 77  BILITOT 0.7  BILIDIR 0.2  IBILI 0.5    Recent Labs  01/19/13 0300  CHOL 169   No results found for this basename: PROTIME,  in the last 72 hours  Imaging: no new study  Cardiac Studies:   Severe ischemic cardiomyopathy with known ejection fraction of 20-25% that correlates with significantly decreased cardiac output and index with an average output of 3.2 and index of 2.   Significantly elevated Systemic Vascular Resistance, with SVR of 3000.   Severe pulmonary attention was transpulmonary gradient is borderline suggestive of possible mixed primary pulmonary and LV failure related pulmonary venous congestion. PVR was 427  Severe aneurysmal/ectatic coronary disease with 100% chronically occluded RCA, 80% distal left main followed by a large aneurysmal segment, 100% occlusion of the AV groove circumflex, and chronic total occlusion of a large lateral OM. There is also moderate to severe disease in the distal LAD.  Assessment/Plan:  Principal Problem:  NSTEMI (non-ST elevated myocardial infarction) Active Problems:   CAD, diffuse by cath 01/20/13- for CABG   Acute combined systolic and diastolic congestive heart failure, NYHA class 3   UTI (urinary tract infection)-  E.Coli, on Cipro   Dyslipidemia   LBBB (left bundle branch block)   Tobacco abuse   Dyspnea, acute   Hypokalemia   Thoracic aortic aneurysm   Cardiomyopathy, ischemic 20-25%  Diuresed well o/v on IV lasix - will continue with current dose for now given EDP of ~32-36  mmHg on cath.; recheck proBNP tomorrow.  Renal function ~stable (has had 2 contrasted studies in 2 days) BP much improved - will need to monitor to ensure she does not get hypotensive. -- added ARB yesterday for afterload reduction.  Would not add BB @ this time due to her decompensated (volume overload) combined HF.  Was seen by Dr. Laneta Simmers yesterday, who agrees that CABG is her best option.  Not sure if RCA is amenable (inferior wall is akinetic & no good target seen), but LAD & possibly an OM.  -- will need to diurese & Rx UTI prior to surgery. Plan is to reassess MR with intra-op TEE (I agree, that MR is likely ischemic, murmur is not overly impressive).  Ecoli UTI - on cipro per C/s.  Will need smoking cessation counseling intermittently. Will likely need LifeVest on d/c depending upon her post-op EF.   LOS: 3 days    HARDING,DAVID W 01/21/2013, 9:03 AM

## 2013-01-21 NOTE — Progress Notes (Addendum)
VASCULAR LAB PRELIMINARY  PRELIMINARY  PRELIMINARY  PRELIMINARY  Pre-op Cardiac Surgery  Carotid Findings:  Right:  40-59% internal carotid artery stenosis.  Left:  1-39% ICA stenosis. Bilateral:  Vertebral artery flow is antegrade.     [name], RVT 01/21/2013 5:17 PM    Upper Extremity Right Left  Brachial Pressures 100-Triphasic 105-Triphasic  Radial Waveforms Triphasic Triphasic  Ulnar Waveforms Triphasic Monophasic  Palmar Arch (Allen's Test) Signal is unaffected with radial compression, obliterates with ulnar compression. Signal obliterates with radial compression, is within normal limits with ulnar compression.    Lower  Extremity Right Left  Dorsalis Pedis 66-Monophasic Absent  Anterior Tibial    Posterior Tibial Absent Absent  Great toe 20 Absent  Ankle/Brachial Indices 0.63 -   Findings:   Unable to palpate pedal pulses.  The right ABI is suggestive of moderate arterial insufficiency, unable to calculate left ABI due to absent pulses. Unable to obtain PPG of left great toe. The right great toe pressure is suggestive of inadequate perfusion and healing.   Bilateral lower extremity arterial duplex was completed. Bilateral external iliac arteries are monophasic, suggestive of aorto-iliac disease.  Bilateral femoral arteries appear to be occluded with some collateral flow.  The right distal peroneal artery and right dorsalis pedis artery are patent with monophasic flow. Unable to insonate the distal posterior tibial artery.  The left distal peroneal artery appears to be patent with a tardus parvus waveform. Unable to insonate the left distal posterior tibial artery and left dorsalis pedis artery.   01/24/2013 1:37 PM [name], RVT, RDCS, RDMS

## 2013-01-22 ENCOUNTER — Encounter (HOSPITAL_COMMUNITY): Payer: Self-pay | Admitting: Cardiology

## 2013-01-22 LAB — CBC
HCT: 32.9 % — ABNORMAL LOW (ref 36.0–46.0)
Hemoglobin: 11.4 g/dL — ABNORMAL LOW (ref 12.0–15.0)
MCH: 27.9 pg (ref 26.0–34.0)
MCHC: 34.7 g/dL (ref 30.0–36.0)
MCV: 80.6 fL (ref 78.0–100.0)
Platelets: 141 10*3/uL — ABNORMAL LOW (ref 150–400)
RBC: 4.08 MIL/uL (ref 3.87–5.11)
RDW: 18.4 % — ABNORMAL HIGH (ref 11.5–15.5)
WBC: 4.9 10*3/uL (ref 4.0–10.5)

## 2013-01-22 LAB — GLUCOSE, CAPILLARY
Glucose-Capillary: 109 mg/dL — ABNORMAL HIGH (ref 70–99)
Glucose-Capillary: 132 mg/dL — ABNORMAL HIGH (ref 70–99)
Glucose-Capillary: 110 mg/dL — ABNORMAL HIGH (ref 70–99)
Glucose-Capillary: 102 mg/dL — ABNORMAL HIGH (ref 70–99)

## 2013-01-22 LAB — BASIC METABOLIC PANEL
BUN: 18 mg/dL (ref 6–23)
CO2: 24 mEq/L (ref 19–32)
Calcium: 9.3 mg/dL (ref 8.4–10.5)
Chloride: 102 mEq/L (ref 96–112)
Creatinine, Ser: 0.84 mg/dL (ref 0.50–1.10)
GFR calc Af Amer: 81 mL/min — ABNORMAL LOW (ref >90)
GFR calc non Af Amer: 70 mL/min — ABNORMAL LOW (ref >90)
Glucose, Bld: 109 mg/dL — ABNORMAL HIGH (ref 70–99)
Potassium: 3.3 mEq/L — ABNORMAL LOW (ref 3.5–5.1)
Sodium: 141 mEq/L (ref 135–145)

## 2013-01-22 LAB — HEPARIN LEVEL (UNFRACTIONATED): Heparin Unfractionated: 0.31 IU/mL (ref 0.30–0.70)

## 2013-01-22 MED ORDER — BISACODYL 10 MG RE SUPP: 10.0000 mg | Freq: Every day | RECTAL | Status: DC | PRN

## 2013-01-22 MED ORDER — POTASSIUM CHLORIDE CRYS ER 20 MEQ PO TBCR
20.0000 meq | EXTENDED_RELEASE_TABLET | Freq: Two times a day (BID) | ORAL | Status: DC
  Administered 2013-01-22 – 2013-01-26 (×9): 20 meq via ORAL
  Filled 2013-01-22 (×13): qty 1

## 2013-01-22 MED ORDER — FUROSEMIDE 80 MG PO TABS
80.0000 mg | ORAL_TABLET | Freq: Every day | ORAL | Status: DC
  Administered 2013-01-23 – 2013-01-26 (×4): 80 mg via ORAL
  Filled 2013-01-22 (×6): qty 1

## 2013-01-22 MED ORDER — PANTOPRAZOLE SODIUM 40 MG PO TBEC
40.0000 mg | DELAYED_RELEASE_TABLET | Freq: Every day | ORAL | Status: DC
  Administered 2013-01-22 – 2013-01-27 (×6): 40 mg via ORAL
  Filled 2013-01-22 (×6): qty 1

## 2013-01-22 MED ORDER — ISOSORBIDE MONONITRATE 15 MG HALF TABLET
15.0000 mg | ORAL_TABLET | Freq: Every day | ORAL | Status: DC
  Administered 2013-01-22 – 2013-01-26 (×5): 15 mg via ORAL
  Filled 2013-01-22 (×6): qty 1

## 2013-01-22 MED ORDER — MAGNESIUM HYDROXIDE 400 MG/5ML PO SUSP: 30.0000 mL | Freq: Every day | ORAL | Status: DC | PRN

## 2013-01-22 MED ORDER — METOPROLOL TARTRATE 12.5 MG HALF TABLET
12.5000 mg | ORAL_TABLET | Freq: Two times a day (BID) | ORAL | Status: DC
  Filled 2013-01-22 (×2): qty 1

## 2013-01-22 MED ORDER — DOCUSATE SODIUM 100 MG PO CAPS
100.0000 mg | ORAL_CAPSULE | Freq: Two times a day (BID) | ORAL | Status: DC
  Administered 2013-01-22 – 2013-01-26 (×10): 100 mg via ORAL
  Filled 2013-01-22 (×14): qty 1

## 2013-01-22 MED ORDER — POTASSIUM CHLORIDE CRYS ER 20 MEQ PO TBCR
40.0000 meq | EXTENDED_RELEASE_TABLET | Freq: Once | ORAL | Status: AC
  Administered 2013-01-22: 40 meq via ORAL
  Filled 2013-01-22: qty 2

## 2013-01-22 NOTE — Progress Notes (Signed)
Subjective:  No chest pain or SOB.  Objective:  Vital Signs in the last 24 hours: Temp:  [97.7 F (36.5 C)-98.7 F (37.1 C)] 98 F (36.7 C) (08/30 0733) Pulse Rate:  [58-80] 76 (08/30 0733) Resp:  [13-30] 13 (08/30 0733) BP: (72-109)/(50-73) 109/61 mmHg (08/30 0733) SpO2:  [98 %-100 %] 99 % (08/30 0733) Weight:  [116 lb 13.5 oz (53 kg)] 116 lb 13.5 oz (53 kg) (08/30 0500)  Intake/Output from previous day:  Intake/Output Summary (Last 24 hours) at 01/22/13 0816 Last data filed at 01/22/13 0800  Gross per 24 hour  Intake   1227 ml  Output   1600 ml  Net   -373 ml    Physical Exam: General appearance: alert, cooperative and no distress Lungs: clear to auscultation bilaterally Heart: regular rate and rhythm and short systolic murmur AOV Extremities: Rt groin without hematoma   Rate: 78  Rhythm: normal sinus rhythm and LBBB  Lab Results:  Recent Labs  01/21/13 0418 01/22/13 0510  WBC 5.4 4.9  HGB 12.1 11.4*  PLT 152 141*    Recent Labs  01/20/13 0400 01/21/13 0418  NA 141 138  K 3.7 3.5  CL 106 100  CO2 24 27  GLUCOSE 75 97  BUN 10 16  CREATININE 0.74 1.00    Recent Labs  01/19/13 1230  TROPONINI 1.99*   No results found for this basename: INR,  in the last 72 hours  Imaging: Imaging results have been reviewed  Cardiac Studies:  Assessment/Plan:   Principal Problem:   NSTEMI (non-ST elevated myocardial infarction) Active Problems:   Dyspnea, acute   CAD, diffuse by cath 01/20/13- for CABG   Acute combined systolic and diastolic congestive heart failure, NYHA class 3   Aneurysmal dilatation both ascending and descending thoracic aorta wth mural thrombus   Cardiomyopathy, ischemic 20-25% with moderate MR   LBBB (left bundle branch block)   Tobacco abuse   Hypokalemia   UTI (urinary tract infection)-  E.Coli, on Cipro   Dyslipidemia    PLAN: Rx UTI and CHF. She is on Lasix 40 mg IV BID, this may have to be decreased soon- today's lab  pending. Add  Nitrate and PPI. Laxative prn. Re evaluate next week for CABG. She is on Heparin, not on a beta blocker- HR down to 48 overnight.   Corine Shelter PA-C Beeper [phone] 01/22/2013, 8:16 AM   I have seen and examined the patient along with Corine Shelter PA-C.  I have reviewed the chart, notes and new data.  I agree with PA/NP's note.  Key new complaints: comfortable, even when lying flat Key examination changes: No rales, but JVD +9-10 cm,  Key new findings / data: LBBB with QRSd>150 ms  PLAN: To be reevaluated for CABg for multivessel CAD w severe cardiomyopathy. If she goes to CABG it would be beneficial to place a permanent LV epicardial lead for potential future CRT. Switch to PO diuretics. Probably transfer to telemetry later today or tomorrow.  Thurmon Fair, MD, [facility] and Vascular Center [phone] 01/22/2013, 8:27 AM

## 2013-01-22 NOTE — Progress Notes (Addendum)
Phase I Cardiac Rehab  Pt pre op CABG education completed including sternal precautions, IS use, cough deep breathing exercises and progressive ambulation post op.  Pt given preparing for surgery booklet.  Pt instructed to watch preparing for surgery video with written instructions how to view.  Smoking cessation counseling began, gave pt info about 1800quitnow and offered emotional support.  Pt demonstrates desire to quit but admits it will be difficult for her.  Pt verbalized understanding.  Will continue to follow patient after surgery.

## 2013-01-22 NOTE — Progress Notes (Signed)
ANTICOAGULATION CONSULT NOTE - Follow Up Consult  Pharmacy Consult for Heparin Indication: CAD  No Known Allergies  Patient Measurements: Height: 5\' 4"  (162.6 cm) Weight: 116 lb 13.5 oz (53 kg) IBW/kg (Calculated) : 54.7 Heparin Dosing Weight: 53.7 kg  Vital Signs: Temp: 98.2 F (36.8 C) (08/30 1130) Temp src: Oral (08/30 1130) BP: 85/49 mmHg (08/30 1130) Pulse Rate: 60 (08/30 1130)  Labs:  Recent Labs  01/19/13 1230  01/20/13 0400  01/21/13 0418 01/21/13 1300 01/22/13 0510  HGB  --   < > 10.8*  --  12.1  --  11.4*  HCT  --   --  32.6*  --  36.7  --  32.9*  PLT  --   --  140*  --  152  --  141*  HEPARINUNFRC 0.37  < > 0.25*  < > 0.11* 0.31 0.31  CREATININE  --   --  0.74  --  1.00  --  0.84  TROPONINI 1.99*  --   --   --   --   --   --   < > = values in this interval not displayed.  Estimated Creatinine Clearance: 53.6 ml/min (by C-G formula based on Cr of 0.84).   Assessment: SOB  PMH: HTN, CAD  AC: Heparin gtt started for ACS. CT neg for acute PE but possible remote/prior PE, also noted mural thrombus on CT. Heparin resumed post-cath. Heparin level 0.31 in goal range. No bleeding from groin site. Hgb down slightly to 11.4. Plts stable 141.  Goal of Therapy:  Heparin level 0.3-0.7 units/ml Monitor platelets by anticoagulation protocol: Yes   Plan:  Continue heparin 1100 units/hr Daily heparin level and CBC  Mitsuo Budnick S. Merilynn Finland, PharmD, BCPS Clinical Staff Pharmacist Pager [phone]  Misty Stanley Stillinger 01/22/2013,12:27 PM

## 2013-01-23 DIAGNOSIS — I2589 Other forms of chronic ischemic heart disease: Secondary | ICD-10-CM

## 2013-01-23 LAB — BASIC METABOLIC PANEL
BUN: 19 mg/dL (ref 6–23)
CO2: 24 mEq/L (ref 19–32)
Calcium: 9.3 mg/dL (ref 8.4–10.5)
Chloride: 104 mEq/L (ref 96–112)
Creatinine, Ser: 0.91 mg/dL (ref 0.50–1.10)
GFR calc Af Amer: 73 mL/min — ABNORMAL LOW (ref >90)
GFR calc non Af Amer: 63 mL/min — ABNORMAL LOW (ref >90)
Glucose, Bld: 101 mg/dL — ABNORMAL HIGH (ref 70–99)
Potassium: 3.7 mEq/L (ref 3.5–5.1)
Sodium: 137 mEq/L (ref 135–145)

## 2013-01-23 LAB — CBC
HCT: 33.1 % — ABNORMAL LOW (ref 36.0–46.0)
Hemoglobin: 11 g/dL — ABNORMAL LOW (ref 12.0–15.0)
MCH: 27.4 pg (ref 26.0–34.0)
MCHC: 33.2 g/dL (ref 30.0–36.0)
MCV: 82.3 fL (ref 78.0–100.0)
Platelets: 139 10*3/uL — ABNORMAL LOW (ref 150–400)
RBC: 4.02 MIL/uL (ref 3.87–5.11)
RDW: 18.6 % — ABNORMAL HIGH (ref 11.5–15.5)
WBC: 5.4 10*3/uL (ref 4.0–10.5)

## 2013-01-23 LAB — GLUCOSE, CAPILLARY
Glucose-Capillary: 147 mg/dL — ABNORMAL HIGH (ref 70–99)
Glucose-Capillary: 99 mg/dL (ref 70–99)

## 2013-01-23 LAB — HEPARIN LEVEL (UNFRACTIONATED): Heparin Unfractionated: 0.62 IU/mL (ref 0.30–0.70)

## 2013-01-23 NOTE — Progress Notes (Signed)
Subjective:  No chest pain or SOB  Objective:  Vital Signs in the last 24 hours: Temp:  [98.1 F (36.7 C)-98.8 F (37.1 C)] 98.2 F (36.8 C) (08/31 0811) Pulse Rate:  [60] 60 (08/30 1130) Resp:  [12-32] 32 (08/31 0811) BP: (85-143)/(48-81) 143/81 mmHg (08/31 0811) SpO2:  [98 %-100 %] 99 % (08/31 0811) Weight:  [119 lb 4.3 oz (54.1 kg)] 119 lb 4.3 oz (54.1 kg) (08/31 0357)  Intake/Output from previous day:  Intake/Output Summary (Last 24 hours) at 01/23/13 0855 Last data filed at 01/23/13 0600  Gross per 24 hour  Intake    534 ml  Output   2125 ml  Net  -1591 ml    Physical Exam: General appearance: alert, cooperative and no distress Lungs: clear to auscultation bilaterally Heart: regular rate and rhythm   Rate: 48-60  Rhythm: normal sinus rhythm and sinus bradycardia  Lab Results:  Recent Labs  01/22/13 0510 01/23/13 0450  WBC 4.9 5.4  HGB 11.4* 11.0*  PLT 141* 139*    Recent Labs  01/22/13 0510 01/23/13 0450  NA 141 137  K 3.3* 3.7  CL 102 104  CO2 24 24  GLUCOSE 109* 101*  BUN 18 19  CREATININE 0.84 0.91   No results found for this basename: TROPONINI, CK, MB,  in the last 72 hours No results found for this basename: INR,  in the last 72 hours  Imaging: Imaging results have been reviewed  Cardiac Studies:  Assessment/Plan:   Principal Problem:   NSTEMI (non-ST elevated myocardial infarction) Active Problems:   Dyspnea, acute   CAD, diffuse by cath 01/20/13- for CABG   Acute combined systolic and diastolic congestive heart failure, NYHA class 3   Aneurysmal dilatation both ascending and descending thoracic aorta wth mural thrombus   Cardiomyopathy, ischemic 20-25% with moderate MR   LBBB (left bundle branch block)   Tobacco abuse   Hypokalemia   UTI (urinary tract infection)-  E.Coli, on Cipro   Dyslipidemia    PLAN: Transfer to telemetry. She is on Cozaar 25 mg, Lasix 80 mg po, Heparin, ASA, Lipitor, and K+. She is not on a beta  blocker because of bradycardia. Last dose of Cipro is tonight (UTI).  Follow up BMP in am.  Corine Shelter PA-C Beeper [phone] 01/23/2013, 8:55 AM  I have seen and examined the patient along with Corine Shelter PA-C.  I have reviewed the chart, notes and new data.  I agree with PA's note.  Key new complaints: no angina or dyspnea walking in room Key examination changes: no clinical HF  PLAN: Transfer telemetry. For probable CABG next week.  Thurmon Fair, MD, [facility] and Vascular Center [phone] 01/23/2013, 10:03 AM  .

## 2013-01-23 NOTE — Progress Notes (Signed)
ANTICOAGULATION CONSULT NOTE - Follow Up Consult  Pharmacy Consult for Heparin Indication: CAD  No Known Allergies  Patient Measurements: Height: 5\' 4"  (162.6 cm) Weight: 119 lb 4.3 oz (54.1 kg) IBW/kg (Calculated) : 54.7 Heparin Dosing Weight: 53.7 kg  Vital Signs: Temp: 98.2 F (36.8 C) (08/31 0811) Temp src: Oral (08/31 0811) BP: 143/81 mmHg (08/31 0811)  Labs:  Recent Labs  01/21/13 0418 01/21/13 1300 01/22/13 0510 01/23/13 0450  HGB 12.1  --  11.4* 11.0*  HCT 36.7  --  32.9* 33.1*  PLT 152  --  141* 139*  HEPARINUNFRC 0.11* 0.31 0.31 0.62  CREATININE 1.00  --  0.84 0.91    Estimated Creatinine Clearance: 50.5 ml/min (by C-G formula based on Cr of 0.91).   Assessment: SOB  PMH: HTN, CAD  AC: Heparin gtt started for ACS. CT neg for acute PE but possible remote/prior PE, also noted mural thrombus on CT. Heparin resumed post-cath. Heparin level 0.62 in goal range. No bleeding from groin site. Hgb down slightly to 11. Plts stable 139.  ID: Afebrile, WBC WNL, cipro 3 for Ecoli UTI  8/26 Urine - Ecoli (pan-sens)  CV: Hx HTN, ICM with EF 20-25%, known CAD - VSS. Cath 8/28 with severe multivessel dz. CT=Aneurysmal dilatation both ascending and descending thoracic aorta wth mural thrombus. Meds: asa 81mg , lipitor, po furosemide, Imdur, K+, losartan.  Endo: No hx - CBGs 97-132 on SSI, TSH WNL, A1c 5.6  GI/Nutr: po PPI  Renal: Scr 0.84  Pulm: 99%, +tobacco  Heme/Onc: Hg= 11, plts 139 stable.  Best practices: heparin   Goal of Therapy:  Heparin level 0.3-0.7 units/ml Monitor platelets by anticoagulation protocol: Yes   Plan:  Continue heparin 1100 units/hr Daily heparin level and CBC F/u CABG  Nonie Lochner S. Merilynn Finland, PharmD, [facility] Clinical Staff Pharmacist Pager [phone]  Misty Stanley Stillinger 01/23/2013,10:30 AM

## 2013-01-24 DIAGNOSIS — I2510 Atherosclerotic heart disease of native coronary artery without angina pectoris: Secondary | ICD-10-CM

## 2013-01-24 DIAGNOSIS — Z01810 Encounter for preprocedural cardiovascular examination: Secondary | ICD-10-CM

## 2013-01-24 LAB — CBC
HCT: 35 % — ABNORMAL LOW (ref 36.0–46.0)
Hemoglobin: 11.6 g/dL — ABNORMAL LOW (ref 12.0–15.0)
MCH: 27.2 pg (ref 26.0–34.0)
MCHC: 33.1 g/dL (ref 30.0–36.0)
MCV: 82.2 fL (ref 78.0–100.0)
Platelets: 143 10*3/uL — ABNORMAL LOW (ref 150–400)
RBC: 4.26 MIL/uL (ref 3.87–5.11)
RDW: 18.8 % — ABNORMAL HIGH (ref 11.5–15.5)
WBC: 6.2 10*3/uL (ref 4.0–10.5)

## 2013-01-24 LAB — BASIC METABOLIC PANEL
BUN: 26 mg/dL — ABNORMAL HIGH (ref 6–23)
CO2: 25 mEq/L (ref 19–32)
Calcium: 10.2 mg/dL (ref 8.4–10.5)
Chloride: 102 mEq/L (ref 96–112)
Creatinine, Ser: 0.99 mg/dL (ref 0.50–1.10)
GFR calc Af Amer: 66 mL/min — ABNORMAL LOW (ref >90)
GFR calc non Af Amer: 57 mL/min — ABNORMAL LOW (ref >90)
Glucose, Bld: 123 mg/dL — ABNORMAL HIGH (ref 70–99)
Potassium: 3.9 mEq/L (ref 3.5–5.1)
Sodium: 138 mEq/L (ref 135–145)

## 2013-01-24 LAB — GLUCOSE, CAPILLARY
Glucose-Capillary: 132 mg/dL — ABNORMAL HIGH (ref 70–99)
Glucose-Capillary: 109 mg/dL — ABNORMAL HIGH (ref 70–99)

## 2013-01-24 LAB — HEPARIN LEVEL (UNFRACTIONATED)
Heparin Unfractionated: 0.85 IU/mL — ABNORMAL HIGH (ref 0.30–0.70)
Heparin Unfractionated: 0.66 IU/mL (ref 0.30–0.70)

## 2013-01-24 NOTE — Progress Notes (Signed)
ANTICOAGULATION CONSULT NOTE - Follow Up Consult  Pharmacy Consult for heparin Indication: CAD awaiting CABG  Labs:  Recent Labs  01/22/13 0510 01/23/13 0450 01/24/13 0425  HGB 11.4* 11.0* 11.6*  HCT 32.9* 33.1* 35.0*  PLT 141* 139* 143*  HEPARINUNFRC 0.31 0.62 0.85*  CREATININE 0.84 0.91  --      Assessment: 67yo female now supratherapeutic on heparin after two levels at goal though had been trending up, apparently now accumulating.  Goal of Therapy:  Heparin level 0.3-0.7 units/ml   Plan:  Will decrease heparin gtt by 2 units/kg/hr to 1000 units/hr and check level in 6-8hr.  Vernard Gambles, PharmD, BCPS  01/24/2013,5:29 AM

## 2013-01-24 NOTE — Progress Notes (Signed)
Pt ambulated in unit 350 ft VIA walker on room air with 2 RNs standing by. Pt had no complaints of pain or SOB. O2 stats were 96% after 150 ft of ambulation. Pt is now in chair with call bell at side. Ilean Skill, Rilya Longo R, RN

## 2013-01-24 NOTE — Progress Notes (Addendum)
ANTICOAGULATION CONSULT NOTE - Follow Up Consult  Pharmacy Consult for Heparin Indication: CAD  No Known Allergies  Patient Measurements: Height: 5\' 4"  (162.6 cm) Weight: 118 lb 3.2 oz (53.615 kg) IBW/kg (Calculated) : 54.7 Heparin Dosing Weight: 53.7 kg  Vital Signs: Temp: 98 F (36.7 C) (09/01 0441) Temp src: Oral (09/01 0441) BP: 92/60 mmHg (09/01 0503) Pulse Rate: 69 (09/01 0441)  Labs:  Recent Labs  01/22/13 0510 01/23/13 0450 01/24/13 0425 01/24/13 1150  HGB 11.4* 11.0* 11.6*  --   HCT 32.9* 33.1* 35.0*  --   PLT 141* 139* 143*  --   HEPARINUNFRC 0.31 0.62 0.85* 0.66  CREATININE 0.84 0.91 0.99  --     Estimated Creatinine Clearance: 46 ml/min (by C-G formula based on Cr of 0.99).   Assessment:  Kathryn Dixon on heparin for CAD awaiting CABG. She was previously at goal, but early this morning her heparin level became SUPRAtherapeutic on 1100units/hr. No bleeding or complications noted. The rate was lowered to 1000units/hr and a recheck heparin level was in range at 0.66. Her Hgb and Plts remain stable.   Goal of Therapy:  Heparin level 0.3-0.7 units/ml Monitor platelets by anticoagulation protocol: Yes   Plan:  1. Continue heparin at 1000 units/hr 2. 6 hour HL to confirm new rate 3. Daily heparin level and CBC 4. F/u CABG plans/timing  Lauren D. Bajbus, PharmD Clinical Pharmacist Pager: [phone] 01/24/2013 1:05 PM   Addendum: Confirmatory heparin level (0.57) is at goal. Plan: Continue current rate. F/u Am labs  Bayard Hugger, PharmD, BCPS  Clinical Pharmacist  Pager: [phone]

## 2013-01-24 NOTE — Progress Notes (Signed)
Subjective:  No chest pain  Objective:  Vital Signs in the last 24 hours: Temp:  [97.4 F (36.3 C)-98.3 F (36.8 C)] 98 F (36.7 C) (09/01 0441) Pulse Rate:  [52-69] 69 (09/01 0441) Resp:  [16-51] 20 (09/01 0441) BP: (89-122)/(45-63) 92/60 mmHg (09/01 0503) SpO2:  [100 %] 100 % (09/01 0441) Weight:  [118 lb 3.2 oz (53.615 kg)] 118 lb 3.2 oz (53.615 kg) (09/01 0441)  Intake/Output from previous day:  Intake/Output Summary (Last 24 hours) at 01/24/13 0842 Last data filed at 01/23/13 2300  Gross per 24 hour  Intake      0 ml  Output   1800 ml  Net  -1800 ml   . aspirin EC  81 mg Oral Daily  . atorvastatin  10 mg Oral q1800  . docusate sodium  100 mg Oral BID  . furosemide  80 mg Oral Daily  . insulin aspart  0-9 Units Subcutaneous TID WC  . isosorbide mononitrate  15 mg Oral Daily  . losartan  25 mg Oral Daily  . pantoprazole  40 mg Oral Q0600  . potassium chloride  20 mEq Oral BID  . sodium chloride  3 mL Intravenous Q12H   . sodium chloride 10 mL/hr at 01/22/13 1945  . heparin 1,000 Units/hr (01/24/13 0549)   Physical Exam: General appearance: alert, cooperative and no distress No JVD Lungs: clear to auscultation bilaterally Heart: regular rate and rhythm 1/6 sem BS+ No edema   Rate: 70  Rhythm: normal sinus rhythm  Lab Results:  Recent Labs  01/23/13 0450 01/24/13 0425  WBC 5.4 6.2  HGB 11.0* 11.6*  PLT 139* 143*    Recent Labs  01/23/13 0450 01/24/13 0425  NA 137 138  K 3.7 3.9  CL 104 102  CO2 24 25  GLUCOSE 101* 123*  BUN 19 26*  CREATININE 0.91 0.99   No results found for this basename: TROPONINI, CK, MB,  in the last 72 hours No results found for this basename: INR,  in the last 72 hours  Imaging: Imaging results have been reviewed  Cardiac Studies:  Assessment/Plan:   Principal Problem:   NSTEMI (non-ST elevated myocardial infarction) Active Problems:   Dyspnea, acute   CAD, diffuse by cath 01/20/13- for CABG   Acute combined  systolic and diastolic congestive heart failure, NYHA class 3   Aneurysmal dilatation both ascending and descending thoracic aorta wth mural thrombus   Cardiomyopathy, ischemic 20-25% with moderate MR   LBBB (left bundle branch block)   Tobacco abuse   Hypokalemia   UTI (urinary tract infection)-  E.Coli, on Cipro   Dyslipidemia    PLAN: For CABG this week.  Corine Shelter PA-C Beeper [phone] 01/24/2013, 8:42 AM     Patient seen and examined. Agree with assessment and plan. No recurrent chest pain. Completed Cipro for UTI. CABG this week.   Lennette Bihari, MD, [facility] 01/24/2013 8:48 AM

## 2013-01-25 DIAGNOSIS — I729 Aneurysm of unspecified site: Secondary | ICD-10-CM

## 2013-01-25 LAB — BASIC METABOLIC PANEL
BUN: 29 mg/dL — ABNORMAL HIGH (ref 6–23)
CO2: 29 mEq/L (ref 19–32)
Calcium: 10.5 mg/dL (ref 8.4–10.5)
Chloride: 102 mEq/L (ref 96–112)
Creatinine, Ser: 1 mg/dL (ref 0.50–1.10)
GFR calc Af Amer: 66 mL/min — ABNORMAL LOW (ref >90)
GFR calc non Af Amer: 57 mL/min — ABNORMAL LOW (ref >90)
Glucose, Bld: 101 mg/dL — ABNORMAL HIGH (ref 70–99)
Potassium: 4.1 mEq/L (ref 3.5–5.1)
Sodium: 138 mEq/L (ref 135–145)

## 2013-01-25 LAB — CBC
HCT: 34.8 % — ABNORMAL LOW (ref 36.0–46.0)
Hemoglobin: 11.6 g/dL — ABNORMAL LOW (ref 12.0–15.0)
MCH: 27.4 pg (ref 26.0–34.0)
MCHC: 33.3 g/dL (ref 30.0–36.0)
MCV: 82.3 fL (ref 78.0–100.0)
Platelets: 142 10*3/uL — ABNORMAL LOW (ref 150–400)
RBC: 4.23 MIL/uL (ref 3.87–5.11)
RDW: 18.9 % — ABNORMAL HIGH (ref 11.5–15.5)
WBC: 6.3 10*3/uL (ref 4.0–10.5)

## 2013-01-25 LAB — HEPARIN LEVEL (UNFRACTIONATED): Heparin Unfractionated: 0.61 IU/mL (ref 0.30–0.70)

## 2013-01-25 LAB — GLUCOSE, CAPILLARY
Glucose-Capillary: 127 mg/dL — ABNORMAL HIGH (ref 70–99)
Glucose-Capillary: 137 mg/dL — ABNORMAL HIGH (ref 70–99)

## 2013-01-25 NOTE — Progress Notes (Signed)
ANTICOAGULATION CONSULT NOTE - Follow Up Consult  Pharmacy Consult for Heparin Indication: severe CAD, mural thrombus  No Known Allergies  Patient Measurements: Height: 5\' 4"  (162.6 cm) Weight: 117 lb 8.1 oz (53.3 kg) IBW/kg (Calculated) : 54.7 Heparin Dosing Weight: 53 kg  Vital Signs: Temp: 98.1 F (36.7 C) (09/02 0516) Temp src: Oral (09/02 0516) BP: 103/67 mmHg (09/02 0516) Pulse Rate: 71 (09/02 0516)  Labs:  Recent Labs  01/23/13 0450 01/24/13 0425 01/24/13 1150 01/24/13 1800 01/25/13 0455  HGB 11.0* 11.6*  --   --  11.6*  HCT 33.1* 35.0*  --   --  34.8*  PLT 139* 143*  --   --  142*  HEPARINUNFRC 0.62 0.85* 0.66 0.57 0.61  CREATININE 0.91 0.99  --   --  1.00    Estimated Creatinine Clearance: 45.3 ml/min (by C-G formula based on Cr of 1).   Medications:  Heparin @ 1000 units/hr  Assessment: 67yof continues on heparin for severe multivessel CAD awaiting CABG this week, also with mural thrombus noted on CT. Heparin level is therapeutic. CBC is stable. No bleeding reported.  Goal of Therapy:  Heparin level 0.3-0.7 units/ml Monitor platelets by anticoagulation protocol: Yes   Plan:  1) Continue heparin at 1000 units/hr 2) Heparin level, CBC in AM 3) Follow up timing of CABG  Fredrik Rigger 01/25/2013,9:31 AM

## 2013-01-25 NOTE — Progress Notes (Signed)
Pt. Seen and examined. Agree with the NP/PA-C note as written.  Dyspnea has improved. Awaiting CABG this week. I spoke with her daughter briefly about her mother's condition. She continues to diurese well, now out 8.6L since admit.  Chrystie Nose, MD, [facility] Attending Cardiologist [facility] & V[facility] Center

## 2013-01-25 NOTE — Progress Notes (Signed)
[facility] and V[facility] Center  Subjective: No chest pain or SOB.   Objective: Vital signs in last 24 hours: Temp:  [98 F (36.7 C)-98.8 F (37.1 C)] 98.1 F (36.7 C) (09/02 0516) Pulse Rate:  [71-81] 71 (09/02 0516) Resp:  [18-20] 18 (09/02 0516) BP: (92-105)/(59-67) 103/67 mmHg (09/02 0516) SpO2:  [96 %-100 %] 96 % (09/02 0516) Weight:  [117 lb 8.1 oz (53.3 kg)] 117 lb 8.1 oz (53.3 kg) (09/02 0516) Last BM Date: 01/23/13  Intake/Output from previous day: 09/01 0701 - 09/02 0700 In: 600 [P.O.:600] Out: 1800 [Urine:1800] Intake/Output this shift:    Medications Current Facility-Administered Medications  Medication Dose Route Frequency Provider Last Rate Last Dose  . 0.9 %  sodium chloride infusion   Intravenous Continuous Shelda Jakes, MD 10 mL/hr at 01/22/13 1945    . 0.9 %  sodium chloride infusion  250 mL Intravenous PRN Marykay Lex, MD      . acetaminophen (TYLENOL) tablet 650 mg  650 mg Oral Q4H PRN Wilburt Finlay, PA-C      . aspirin EC tablet 81 mg  81 mg Oral Daily Wilburt Finlay, PA-C   81 mg at 01/24/13 1142  . atorvastatin (LIPITOR) tablet 10 mg  10 mg Oral q1800 Wilburt Finlay, PA-C   10 mg at 01/24/13 1730  . bisacodyl (DULCOLAX) suppository 10 mg  10 mg Rectal Daily PRN Abelino Derrick, PA-C      . docusate sodium (COLACE) capsule 100 mg  100 mg Oral BID Eda Paschal Kilroy, PA-C   100 mg at 01/24/13 2154  . furosemide (LASIX) tablet 80 mg  80 mg Oral Daily Mihai Croitoru, MD   80 mg at 01/24/13 1141  . heparin ADULT infusion 100 units/mL (25000 units/250 mL)  1,000 Units/hr Intravenous Continuous Colleen Can, RPH 10 mL/hr at 01/25/13 0243 1,000 Units/hr at 01/25/13 0243  . insulin aspart (novoLOG) injection 0-9 Units  0-9 Units Subcutaneous TID WC Wilburt Finlay, PA-C   1 Units at 01/24/13 1208  . isosorbide mononitrate (IMDUR) 24 hr tablet 15 mg  15 mg Oral Daily Abelino Derrick, PA-C   15 mg at 01/24/13 1141  . losartan (COZAAR) tablet 25 mg  25 mg  Oral Daily Abelino Derrick, PA-C   25 mg at 01/24/13 1142  . magnesium hydroxide (MILK OF MAGNESIA) suspension 30 mL  30 mL Oral Daily PRN Abelino Derrick, PA-C      . morphine 2 MG/ML injection 1 mg  1 mg Intravenous Q1H PRN Marykay Lex, MD      . nitroGLYCERIN (NITROSTAT) SL tablet 0.4 mg  0.4 mg Sublingual Q5 Min x 3 PRN Wilburt Finlay, PA-C      . ondansetron [facility]) injection 4 mg  4 mg Intravenous Q6H PRN Wilburt Finlay, PA-C      . pantoprazole (PROTONIX) EC tablet 40 mg  40 mg Oral Q0600 Abelino Derrick, PA-C   40 mg at 01/25/13 5284  . potassium chloride SA (K-DUR,KLOR-CON) CR tablet 20 mEq  20 mEq Oral BID Abelino Derrick, PA-C   20 mEq at 01/24/13 2154  . sodium chloride 0.9 % injection 3 mL  3 mL Intravenous Q12H Marykay Lex, MD      . sodium chloride 0.9 % injection 3 mL  3 mL Intravenous PRN Marykay Lex, MD        PE: General appearance: alert, cooperative and no distress Lungs: clear to auscultation bilaterally Heart:  regular rate and rhythm Extremities: no LEE Pulses: 2+ and symmetric Skin: warm and dry Neurologic: Grossly normal  Lab Results:   Recent Labs  01/23/13 0450 01/24/13 0425 01/25/13 0455  WBC 5.4 6.2 6.3  HGB 11.0* 11.6* 11.6*  HCT 33.1* 35.0* 34.8*  PLT 139* 143* 142*   BMET  Recent Labs  01/23/13 0450 01/24/13 0425 01/25/13 0455  NA 137 138 138  K 3.7 3.9 4.1  CL 104 102 102  CO2 24 25 29   GLUCOSE 101* 123* 101*  BUN 19 26* 29*  CREATININE 0.91 0.99 1.00  CALCIUM 9.3 10.2 10.5    Assessment/Plan  Principal Problem:   NSTEMI (non-ST elevated myocardial infarction) Active Problems:   LBBB (left bundle branch block)   Dyspnea, acute   Tobacco abuse   CAD, diffuse by cath 01/20/13- for CABG   Aneurysmal dilatation both ascending and descending thoracic aorta wth mural thrombus   Hypokalemia   Acute combined systolic and diastolic congestive heart failure, NYHA class 3   Cardiomyopathy, ischemic 20-25% with moderate MR   UTI  (urinary tract infection)-  E.Coli, on Cipro   Dyslipidemia  Plan: No further CP/SOB. HR and BP both stable. Continue on medical therapy until decision is made regarding CABG for severe multivessel coronary disease.     LOS: 7 days    Toya Palacios M. Delmer Islam 01/25/2013 7:44 AM

## 2013-01-26 ENCOUNTER — Inpatient Hospital Stay (HOSPITAL_COMMUNITY): Payer: Medicare Other

## 2013-01-26 DIAGNOSIS — E876 Hypokalemia: Secondary | ICD-10-CM

## 2013-01-26 DIAGNOSIS — E785 Hyperlipidemia, unspecified: Secondary | ICD-10-CM

## 2013-01-26 DIAGNOSIS — N390 Urinary tract infection, site not specified: Secondary | ICD-10-CM

## 2013-01-26 LAB — BASIC METABOLIC PANEL
BUN: 30 mg/dL — ABNORMAL HIGH (ref 6–23)
BUN: 29 mg/dL — ABNORMAL HIGH (ref 6–23)
CO2: 26 mEq/L (ref 19–32)
Calcium: 10.2 mg/dL (ref 8.4–10.5)
Chloride: 103 mEq/L (ref 96–112)
Chloride: 102 mEq/L (ref 96–112)
Creatinine, Ser: 1.07 mg/dL (ref 0.50–1.10)
Creatinine, Ser: 1.13 mg/dL — ABNORMAL HIGH (ref 0.50–1.10)
GFR calc Af Amer: 60 mL/min — ABNORMAL LOW (ref >90)
GFR calc Af Amer: 57 mL/min — ABNORMAL LOW (ref >90)
GFR calc non Af Amer: 52 mL/min — ABNORMAL LOW (ref >90)
Glucose, Bld: 108 mg/dL — ABNORMAL HIGH (ref 70–99)
Glucose, Bld: 136 mg/dL — ABNORMAL HIGH (ref 70–99)
Potassium: 4 mEq/L (ref 3.5–5.1)
Sodium: 139 mEq/L (ref 135–145)

## 2013-01-26 LAB — GLUCOSE, CAPILLARY
Glucose-Capillary: 95 mg/dL (ref 70–99)
Glucose-Capillary: 114 mg/dL — ABNORMAL HIGH (ref 70–99)
Glucose-Capillary: 124 mg/dL — ABNORMAL HIGH (ref 70–99)

## 2013-01-26 LAB — CBC
HCT: 34.2 % — ABNORMAL LOW (ref 36.0–46.0)
HCT: 38.4 % (ref 36.0–46.0)
Hemoglobin: 11.3 g/dL — ABNORMAL LOW (ref 12.0–15.0)
MCH: 27 pg (ref 26.0–34.0)
MCH: 27.7 pg (ref 26.0–34.0)
MCHC: 33 g/dL (ref 30.0–36.0)
MCHC: 33.3 g/dL (ref 30.0–36.0)
MCV: 81.6 fL (ref 78.0–100.0)
MCV: 83.1 fL (ref 78.0–100.0)
Platelets: 155 10*3/uL (ref 150–400)
RBC: 4.19 MIL/uL (ref 3.87–5.11)
RDW: 19.2 % — ABNORMAL HIGH (ref 11.5–15.5)
RDW: 19.7 % — ABNORMAL HIGH (ref 11.5–15.5)
WBC: 7 10*3/uL (ref 4.0–10.5)

## 2013-01-26 LAB — PROTIME-INR
INR: 0.97 (ref 0.00–1.49)
Prothrombin Time: 12.7 seconds (ref 11.6–15.2)

## 2013-01-26 LAB — SURGICAL PCR SCREEN: MRSA, PCR: NEGATIVE

## 2013-01-26 LAB — TYPE AND SCREEN: Antibody Screen: NEGATIVE

## 2013-01-26 LAB — HEPARIN LEVEL (UNFRACTIONATED): Heparin Unfractionated: 0.56 IU/mL (ref 0.30–0.70)

## 2013-01-26 LAB — APTT: aPTT: 124 seconds — ABNORMAL HIGH (ref 24–37)

## 2013-01-26 MED ORDER — DEXMEDETOMIDINE HCL IN NACL 400 MCG/100ML IV SOLN
0.1000 ug/kg/h | INTRAVENOUS | Status: AC
  Administered 2013-01-27: 0.3 ug/kg/h via INTRAVENOUS
  Filled 2013-01-26: qty 100

## 2013-01-26 MED ORDER — NITROGLYCERIN IN D5W 200-5 MCG/ML-% IV SOLN
2.0000 ug/min | INTRAVENOUS | Status: AC
  Administered 2013-01-27: 16.67 ug/min via INTRAVENOUS
  Filled 2013-01-26: qty 250

## 2013-01-26 MED ORDER — PHENYLEPHRINE HCL 10 MG/ML IJ SOLN
30.0000 ug/min | INTRAVENOUS | Status: AC
  Administered 2013-01-27: 30 ug/min via INTRAVENOUS
  Filled 2013-01-26: qty 2

## 2013-01-26 MED ORDER — SODIUM CHLORIDE 0.9 % IV SOLN
INTRAVENOUS | Status: AC
  Administered 2013-01-27: 1 [IU]/h via INTRAVENOUS
  Filled 2013-01-26: qty 1

## 2013-01-26 MED ORDER — CEFUROXIME SODIUM 750 MG IJ SOLR
750.0000 mg | INTRAMUSCULAR | Status: DC
  Filled 2013-01-26 (×2): qty 750

## 2013-01-26 MED ORDER — BISACODYL 5 MG PO TBEC
5.0000 mg | DELAYED_RELEASE_TABLET | Freq: Once | ORAL | Status: AC
  Administered 2013-01-26: 5 mg via ORAL
  Filled 2013-01-26: qty 1

## 2013-01-26 MED ORDER — MIDAZOLAM HCL 2 MG/2ML IJ SOLN: 1.0000 mg | INTRAMUSCULAR | Status: DC | PRN

## 2013-01-26 MED ORDER — FENTANYL CITRATE 0.05 MG/ML IJ SOLN: 50.0000 ug | INTRAMUSCULAR | Status: DC | PRN

## 2013-01-26 MED ORDER — SODIUM CHLORIDE 0.9 % IV SOLN
INTRAVENOUS | Status: DC
  Filled 2013-01-26: qty 30

## 2013-01-26 MED ORDER — DOPAMINE-DEXTROSE 3.2-5 MG/ML-% IV SOLN
2.0000 ug/kg/min | INTRAVENOUS | Status: DC
  Filled 2013-01-26: qty 250

## 2013-01-26 MED ORDER — CHLORHEXIDINE GLUCONATE 4 % EX LIQD
60.0000 mL | Freq: Once | CUTANEOUS | Status: AC
Start: 2013-01-27 — End: 2013-01-27
  Administered 2013-01-27: 4 via TOPICAL
  Filled 2013-01-26: qty 60

## 2013-01-26 MED ORDER — DEXTROSE 5 % IV SOLN
1.5000 g | INTRAVENOUS | Status: AC
  Administered 2013-01-27: 1.5 g via INTRAVENOUS
  Administered 2013-01-27: .75 g via INTRAVENOUS
  Filled 2013-01-26: qty 1.5

## 2013-01-26 MED ORDER — CHLORHEXIDINE GLUCONATE 4 % EX LIQD
60.0000 mL | Freq: Once | CUTANEOUS | Status: AC
  Administered 2013-01-26: 4 via TOPICAL
  Filled 2013-01-26 (×2): qty 60

## 2013-01-26 MED ORDER — PLASMA-LYTE 148 IV SOLN
INTRAVENOUS | Status: AC
  Administered 2013-01-27: 10:00:00
  Filled 2013-01-26: qty 2.5

## 2013-01-26 MED ORDER — METOPROLOL TARTRATE 12.5 MG HALF TABLET
12.5000 mg | ORAL_TABLET | Freq: Once | ORAL | Status: AC
  Administered 2013-01-27: 12.5 mg via ORAL
  Filled 2013-01-26: qty 1

## 2013-01-26 MED ORDER — MAGNESIUM SULFATE 50 % IJ SOLN
40.0000 meq | INTRAMUSCULAR | Status: DC
  Filled 2013-01-26: qty 10

## 2013-01-26 MED ORDER — LACTATED RINGERS IV SOLN: INTRAVENOUS | Status: DC

## 2013-01-26 MED ORDER — DIAZEPAM 5 MG PO TABS
5.0000 mg | ORAL_TABLET | Freq: Once | ORAL | Status: AC
  Administered 2013-01-27: 5 mg via ORAL
  Filled 2013-01-26: qty 1

## 2013-01-26 MED ORDER — POTASSIUM CHLORIDE 2 MEQ/ML IV SOLN
80.0000 meq | INTRAVENOUS | Status: DC
  Filled 2013-01-26: qty 40

## 2013-01-26 MED ORDER — VANCOMYCIN HCL 10 G IV SOLR
1250.0000 mg | INTRAVENOUS | Status: AC
  Administered 2013-01-27: 1250 mg via INTRAVENOUS
  Filled 2013-01-26: qty 1250

## 2013-01-26 MED ORDER — TEMAZEPAM 15 MG PO CAPS: 15.0000 mg | ORAL_CAPSULE | Freq: Once | ORAL | Status: AC | PRN

## 2013-01-26 MED ORDER — SODIUM CHLORIDE 0.9 % IV SOLN
INTRAVENOUS | Status: DC
  Filled 2013-01-26: qty 40

## 2013-01-26 MED ORDER — EPINEPHRINE HCL 1 MG/ML IJ SOLN
0.5000 ug/min | INTRAVENOUS | Status: DC
  Filled 2013-01-26: qty 4

## 2013-01-26 NOTE — Progress Notes (Signed)
Paged MD on call.  Pt was given 5 mg duccolax and is scheduled for CABG in am 01/27/13. Dr. Donata Clay aware and is OK.  Will continue to monitor. Kathryn Dixon

## 2013-01-26 NOTE — Progress Notes (Signed)
6 Days Post-Op Procedure(s) (LRB): LEFT HEART CATHETERIZATION WITH CORONARY ANGIOGRAM (N/A) RIGHT HEART CATH Subjective:  No chest pain or shortness of breath.  Objective: Vital signs in last 24 hours: Temp:  [98.3 F (36.8 C)-98.6 F (37 C)] 98.6 F (37 C) (09/03 0417) Pulse Rate:  [66-84] 66 (09/03 0417) Cardiac Rhythm:  [-] Normal sinus rhythm;Bundle branch block (09/02 2358) Resp:  [17-18] 17 (09/03 0417) BP: (83-118)/(53-67) 98/67 mmHg (09/03 0417) SpO2:  [97 %-100 %] 98 % (09/03 0417) Weight:  [54.069 kg (119 lb 3.2 oz)] 54.069 kg (119 lb 3.2 oz) (09/03 0417)  Hemodynamic parameters for last 24 hours:    Intake/Output from previous day: 09/02 0701 - 09/03 0700 In: 1320 [P.O.:1080; I.V.:240] Out: -  Intake/Output this shift:    General appearance: alert and cooperative Heart: regular rate and rhythm, S1, S2 normal, no murmur, click, rub or gallop Lungs: clear to auscultation bilaterally Extremities: extremities normal, atraumatic, no cyanosis or edema  Lab Results:  Recent Labs  01/25/13 0455 01/26/13 0405  WBC 6.3 7.0  HGB 11.6* 11.3*  HCT 34.8* 34.2*  PLT 142* 155   BMET:  Recent Labs  01/25/13 0455 01/26/13 0405  NA 138 139  K 4.1 4.0  CL 102 103  CO2 29 26  GLUCOSE 101* 108*  BUN 29* 30*  CREATININE 1.00 1.07  CALCIUM 10.5 10.2    PT/INR: No results found for this basename: LABPROT, INR,  in the last 72 hours ABG    Component Value Date/Time   PHART 7.348* 01/20/2013 1340   HCO3 20.4 01/20/2013 1340   TCO2 22 01/20/2013 1340   ACIDBASEDEF 5.0* 01/20/2013 1340   O2SAT 85.0 01/20/2013 1340   CBG (last 3)   Recent Labs  01/25/13 1610 01/25/13 2044 01/26/13 0603  GLUCAP 124* 137* 95    Assessment/Plan: S/P Procedure(s) (LRB): LEFT HEART CATHETERIZATION WITH CORONARY ANGIOGRAM (N/A) RIGHT HEART CATH  High grade LM and multi-vessel coronary disease. Plan CABG tomorrow. I discussed the operative procedure with the patient including  alternatives, benefits and risks; including but not limited to bleeding, blood transfusion, infection, stroke, myocardial infarction, graft failure, heart block requiring a permanent pacemaker, organ dysfunction, and death.  Kathryn Dixon understands and agrees to proceed.    LOS: 8 days    Kathryn Dixon K 01/26/2013

## 2013-01-26 NOTE — Progress Notes (Signed)
ANTICOAGULATION CONSULT NOTE - Follow Up Consult  Pharmacy Consult for Heparin Indication: severe CAD, mural thrombus  No Known Allergies  Patient Measurements: Height: 5\' 4"  (162.6 cm) Weight: 119 lb 3.2 oz (54.069 kg) IBW/kg (Calculated) : 54.7 Heparin Dosing Weight: 53 kg  Vital Signs: Temp: 98.6 F (37 C) (09/03 0417) Temp src: Oral (09/03 0417) BP: 98/67 mmHg (09/03 0417) Pulse Rate: 66 (09/03 0417)  Labs:  Recent Labs  01/24/13 0425  01/24/13 1800 01/25/13 0455 01/26/13 0405  HGB 11.6*  --   --  11.6* 11.3*  HCT 35.0*  --   --  34.8* 34.2*  PLT 143*  --   --  142* 155  HEPARINUNFRC 0.85*  < > 0.57 0.61 0.56  CREATININE 0.99  --   --  1.00 1.07  < > = values in this interval not displayed.  Estimated Creatinine Clearance: 43 ml/min (by C-G formula based on Cr of 1.07).   Medications:  Heparin @ 1000 units/hr  Assessment: 67yof continues on heparin for severe multivessel CAD awaiting CABG this week (likely 9/4), also with mural thrombus noted on CT. Heparin level is therapeutic. CBC is stable. No bleeding reported.  Goal of Therapy:  Heparin level 0.3-0.7 units/ml Monitor platelets by anticoagulation protocol: Yes   Plan:  1) Continue heparin at 1000 units/hr 2) Heparin level, CBC in AM 3) Follow up timing of CABG  Fredrik Rigger 01/26/2013,8:35 AM

## 2013-01-26 NOTE — Progress Notes (Signed)
[facility]  Subjective: No complaints. Denies CP/SOB.    Objective: Vital signs in last 24 hours: Temp:  [98.3 F (36.8 C)-98.6 F (37 C)] 98.6 F (37 C) (09/03 0417) Pulse Rate:  [66-84] 66 (09/03 0417) Resp:  [17-18] 17 (09/03 0417) BP: (83-118)/(53-67) 98/67 mmHg (09/03 0417) SpO2:  [97 %-100 %] 98 % (09/03 0417) Weight:  [119 lb 3.2 oz (54.069 kg)] 119 lb 3.2 oz (54.069 kg) (09/03 0417) Last BM Date: 01/25/13  Intake/Output from previous day: 09/02 0701 - 09/03 0700 In: 1320 [P.O.:1080; I.V.:240] Out: -  Intake/Output this shift:    Medications Current Facility-Administered Medications  Medication Dose Route Frequency Provider Last Rate Last Dose  . 0.9 %  sodium chloride infusion   Intravenous Continuous Shelda Jakes, MD 10 mL/hr at 01/22/13 1945    . 0.9 %  sodium chloride infusion  250 mL Intravenous PRN Marykay Lex, MD      . acetaminophen (TYLENOL) tablet 650 mg  650 mg Oral Q4H PRN Wilburt Finlay, PA-C      . aspirin EC tablet 81 mg  81 mg Oral Daily Wilburt Finlay, PA-C   81 mg at 01/25/13 1127  . atorvastatin (LIPITOR) tablet 10 mg  10 mg Oral q1800 Wilburt Finlay, PA-C   10 mg at 01/25/13 1749  . bisacodyl (DULCOLAX) suppository 10 mg  10 mg Rectal Daily PRN Abelino Derrick, PA-C      . docusate sodium (COLACE) capsule 100 mg  100 mg Oral BID Eda Paschal Kilroy, PA-C   100 mg at 01/25/13 2151  . furosemide (LASIX) tablet 80 mg  80 mg Oral Daily Mihai Croitoru, MD   80 mg at 01/25/13 1126  . heparin ADULT infusion 100 units/mL (25000 units/250 mL)  1,000 Units/hr Intravenous Continuous Colleen Can, RPH 10 mL/hr at 01/26/13 0527 1,000 Units/hr at 01/26/13 0527  . insulin aspart (novoLOG) injection 0-9 Units  0-9 Units Subcutaneous TID WC Wilburt Finlay, PA-C   1 Units at 01/25/13 1717  . isosorbide mononitrate (IMDUR) 24 hr tablet 15 mg  15 mg Oral Daily Abelino Derrick, PA-C   15 mg at 01/25/13 1127  . losartan (COZAAR) tablet 25 mg   25 mg Oral Daily Abelino Derrick, PA-C   25 mg at 01/25/13 1126  . magnesium hydroxide (MILK OF MAGNESIA) suspension 30 mL  30 mL Oral Daily PRN Abelino Derrick, PA-C      . morphine 2 MG/ML injection 1 mg  1 mg Intravenous Q1H PRN Marykay Lex, MD      . nitroGLYCERIN (NITROSTAT) SL tablet 0.4 mg  0.4 mg Sublingual Q5 Min x 3 PRN Wilburt Finlay, PA-C      . ondansetron [facility]) injection 4 mg  4 mg Intravenous Q6H PRN Wilburt Finlay, PA-C      . pantoprazole (PROTONIX) EC tablet 40 mg  40 mg Oral Q0600 Abelino Derrick, PA-C   40 mg at 01/26/13 0527  . potassium chloride SA (K-DUR,KLOR-CON) CR tablet 20 mEq  20 mEq Oral BID Abelino Derrick, PA-C   20 mEq at 01/25/13 2150  . sodium chloride 0.9 % injection 3 mL  3 mL Intravenous Q12H Marykay Lex, MD   3 mL at 01/25/13 2151  . sodium chloride 0.9 % injection 3 mL  3 mL Intravenous PRN Marykay Lex, MD        PE: General appearance: alert, cooperative and no distress Lungs: clear  to auscultation bilaterally Heart: regular rate and rhythm, S1, S2 normal, no murmur, click, rub or gallop Extremities: no LEE Pulses: 2+ and symmetric Skin: warm and dry Neurologic: Grossly normal  Lab Results:   Recent Labs  01/24/13 0425 01/25/13 0455 01/26/13 0405  WBC 6.2 6.3 7.0  HGB 11.6* 11.6* 11.3*  HCT 35.0* 34.8* 34.2*  PLT 143* 142* 155   BMET  Recent Labs  01/24/13 0425 01/25/13 0455 01/26/13 0405  NA 138 138 139  K 3.9 4.1 4.0  CL 102 102 103  CO2 25 29 26   GLUCOSE 123* 101* 108*  BUN 26* 29* 30*  CREATININE 0.99 1.00 1.07  CALCIUM 10.2 10.5 10.2    Assessment/Plan   Principal Problem:   NSTEMI (non-ST elevated myocardial infarction) Active Problems:   LBBB (left bundle branch block)   Dyspnea, acute   Tobacco abuse   CAD, diffuse by cath 01/20/13- for CABG   Aneurysmal dilatation both ascending and descending thoracic aorta wth mural thrombus   Hypokalemia   Acute combined systolic and diastolic congestive heart failure,  NYHA class 3   Cardiomyopathy, ischemic 20-25% with moderate MR   UTI (urinary tract infection)-  E.Coli, on Cipro   Dyslipidemia  Plan: CP free. HR and BP stable. Pt is tentatively scheduled for CABG tomorrow with Dr. Lavinia Sharps. Will continue to follow post-op.    LOS: 8 days    Brittainy M. Sharol Harness, PA-C 01/26/2013 8:11 AM  I have seen and evaluated the patient this AM along with Boyce Medici, PA. I agree with her findings, examination as well as impression recommendations.  Doing well from a CHF standpoint - remaining ~net equal over 2 days on PO lasix.  Had diuresed well on IV. No angina on Imdur. BP holding steady on no BB & low dose ARB.  Had been quite low, so would not consider adding additional med pre-op.  Renal Fx, H/H & K all stable. Completed Rx for UTI.  Appreciate Dr. Sharee Pimple input & care.  Plan is CABG tomorrow.  We will follow along post-op.  MD Time with pt: 5 min  Eligio Angert W, M.D., M.S. [facility] [address] [place], [place]  [address]  [phone] Pager # [phone] 01/26/2013 11:03 AM

## 2013-01-26 NOTE — Progress Notes (Signed)
CARDIAC REHAB PHASE I   PRE:  Rate/Rhythm: 74 SR    BP: sitting 86/56    SaO2: 97 RA  MODE:  Ambulation: 350 ft   POST:  Rate/Rhythm: 86 SR    BP: sitting 100/60     SaO2: 99 RA  Reluctant to walk, daughter encouraged her. Tolerated fairly well with RW. C/o legs hurting toward end. Sts this is normal for her and walks very little at home. Uses electric scooter in most grocery stores. Denies CP/SOB. To recliner. Reviewed pre op ed with reminders that mobility will be very important post-op. [id number]   Kathryn Dixon [place] CES, ACSM 01/26/2013 2:28 PM

## 2013-01-27 ENCOUNTER — Inpatient Hospital Stay (HOSPITAL_COMMUNITY): Payer: Medicare Other

## 2013-01-27 ENCOUNTER — Encounter (HOSPITAL_COMMUNITY)
Admission: EM | Disposition: A | Discharge status: Home / Self Care | Payer: Medicare Other | Source: Home / Self Care | Attending: Cardiovascular Disease

## 2013-01-27 ENCOUNTER — Encounter (HOSPITAL_COMMUNITY): Payer: Self-pay | Admitting: Certified Registered"

## 2013-01-27 ENCOUNTER — Inpatient Hospital Stay (HOSPITAL_COMMUNITY): Payer: Medicare Other | Admitting: Certified Registered"

## 2013-01-27 DIAGNOSIS — I2510 Atherosclerotic heart disease of native coronary artery without angina pectoris: Secondary | ICD-10-CM

## 2013-01-27 HISTORY — PX: CORONARY ARTERY BYPASS GRAFT: SHX141

## 2013-01-27 HISTORY — PX: INTRAOPERATIVE TRANSESOPHAGEAL ECHOCARDIOGRAM: SHX5062

## 2013-01-27 LAB — CBC
HCT: 35.9 % — ABNORMAL LOW (ref 36.0–46.0)
HCT: 27.9 % — ABNORMAL LOW (ref 36.0–46.0)
Hemoglobin: 11.8 g/dL — ABNORMAL LOW (ref 12.0–15.0)
Hemoglobin: 9.8 g/dL — ABNORMAL LOW (ref 12.0–15.0)
Hemoglobin: 9.1 g/dL — ABNORMAL LOW (ref 12.0–15.0)
MCH: 27.3 pg (ref 26.0–34.0)
MCHC: 32.9 g/dL (ref 30.0–36.0)
MCV: 83.1 fL (ref 78.0–100.0)
MCV: 83.5 fL (ref 78.0–100.0)
Platelets: 170 10*3/uL (ref 150–400)
Platelets: 187 10*3/uL (ref 150–400)
RBC: 4.32 MIL/uL (ref 3.87–5.11)
RBC: 3.6 MIL/uL — ABNORMAL LOW (ref 3.87–5.11)
RBC: 3.34 MIL/uL — ABNORMAL LOW (ref 3.87–5.11)
RDW: 19.5 % — ABNORMAL HIGH (ref 11.5–15.5)
WBC: 6 10*3/uL (ref 4.0–10.5)
WBC: 9 10*3/uL (ref 4.0–10.5)
WBC: 7.7 10*3/uL (ref 4.0–10.5)

## 2013-01-27 LAB — BASIC METABOLIC PANEL
BUN: 30 mg/dL — ABNORMAL HIGH (ref 6–23)
CO2: 26 mEq/L (ref 19–32)
Calcium: 10.7 mg/dL — ABNORMAL HIGH (ref 8.4–10.5)
Chloride: 108 mEq/L (ref 96–112)
Creatinine, Ser: 0.88 mg/dL (ref 0.50–1.10)
GFR calc Af Amer: 76 mL/min — ABNORMAL LOW (ref >90)
GFR calc non Af Amer: 66 mL/min — ABNORMAL LOW (ref >90)
Glucose, Bld: 116 mg/dL — ABNORMAL HIGH (ref 70–99)
Potassium: 4.7 mEq/L (ref 3.5–5.1)
Sodium: 145 mEq/L (ref 135–145)

## 2013-01-27 LAB — PROTIME-INR
INR: 1.21 (ref 0.00–1.49)
Prothrombin Time: 15 seconds (ref 11.6–15.2)

## 2013-01-27 LAB — CREATININE, SERUM
Creatinine, Ser: 0.75 mg/dL (ref 0.50–1.10)
GFR calc Af Amer: >90 mL/min (ref >90)
GFR calc non Af Amer: 85 mL/min — ABNORMAL LOW (ref >90)

## 2013-01-27 LAB — URINALYSIS, ROUTINE W REFLEX MICROSCOPIC
Bilirubin Urine: NEGATIVE
Glucose, UA: NEGATIVE mg/dL
Hgb urine dipstick: NEGATIVE
Ketones, ur: NEGATIVE mg/dL
Nitrite: NEGATIVE
Protein, ur: NEGATIVE mg/dL
Specific Gravity, Urine: 1.016 (ref 1.005–1.030)
Urobilinogen, UA: 0.2 mg/dL (ref 0.0–1.0)
pH: 6 (ref 5.0–8.0)

## 2013-01-27 LAB — POCT I-STAT 3, ART BLOOD GAS (G3+)
Acid-Base Excess: 4 mmol/L — ABNORMAL HIGH (ref 0.0–2.0)
Bicarbonate: 24.6 mEq/L — ABNORMAL HIGH (ref 20.0–24.0)
Bicarbonate: 27 mEq/L — ABNORMAL HIGH (ref 20.0–24.0)
Bicarbonate: 23.1 mEq/L (ref 20.0–24.0)
O2 Saturation: 100 %
O2 Saturation: 98 %
O2 Saturation: 94 %
Patient temperature: 36.2
TCO2: 26 mmol/L (ref 0–100)
TCO2: 28 mmol/L (ref 0–100)
TCO2: 24 mmol/L (ref 0–100)
TCO2: 32 mmol/L (ref 0–100)
pCO2 arterial: 33.5 mmHg — ABNORMAL LOW (ref 35.0–45.0)
pCO2 arterial: 49.1 mmHg — ABNORMAL HIGH (ref 35.0–45.0)
pCO2 arterial: 52.9 mmHg — ABNORMAL HIGH (ref 35.0–45.0)
pH, Arterial: 7.459 — ABNORMAL HIGH (ref 7.350–7.450)
pH, Arterial: 7.348 — ABNORMAL LOW (ref 7.350–7.450)
pH, Arterial: 7.37 (ref 7.350–7.450)
pO2, Arterial: 73 mmHg — ABNORMAL LOW (ref 80.0–100.0)

## 2013-01-27 LAB — POCT I-STAT 4, (NA,K, GLUC, HGB,HCT)
Glucose, Bld: 92 mg/dL (ref 70–99)
Glucose, Bld: 87 mg/dL (ref 70–99)
Glucose, Bld: 164 mg/dL — ABNORMAL HIGH (ref 70–99)
HCT: 32 % — ABNORMAL LOW (ref 36.0–46.0)
HCT: 22 % — ABNORMAL LOW (ref 36.0–46.0)
HCT: 30 % — ABNORMAL LOW (ref 36.0–46.0)
Hemoglobin: 10.9 g/dL — ABNORMAL LOW (ref 12.0–15.0)
Hemoglobin: 7.5 g/dL — ABNORMAL LOW (ref 12.0–15.0)
Potassium: 4.4 mEq/L (ref 3.5–5.1)
Potassium: 6.1 mEq/L — ABNORMAL HIGH (ref 3.5–5.1)
Potassium: 4.6 mEq/L (ref 3.5–5.1)
Sodium: 141 mEq/L (ref 135–145)
Sodium: 132 mEq/L — ABNORMAL LOW (ref 135–145)

## 2013-01-27 LAB — POCT I-STAT, CHEM 8
BUN: 18 mg/dL (ref 6–23)
Creatinine, Ser: 0.9 mg/dL (ref 0.50–1.10)
Potassium: 4.5 mEq/L (ref 3.5–5.1)
Sodium: 141 mEq/L (ref 135–145)

## 2013-01-27 LAB — HEMOGLOBIN A1C
Hgb A1c MFr Bld: 5.8 % — ABNORMAL HIGH (ref <5.7)
Mean Plasma Glucose: 120 mg/dL — ABNORMAL HIGH (ref <117)

## 2013-01-27 LAB — POCT I-STAT 3, VENOUS BLOOD GAS (G3P V)
Acid-base deficit: 3 mmol/L — ABNORMAL HIGH (ref 0.0–2.0)
pCO2, Ven: 32.5 mmHg — ABNORMAL LOW (ref 45.0–50.0)
pO2, Ven: 28 mmHg — CL (ref 30.0–45.0)

## 2013-01-27 LAB — APTT: aPTT: 36 seconds (ref 24–37)

## 2013-01-27 LAB — URINE MICROSCOPIC-ADD ON

## 2013-01-27 LAB — GLUCOSE, CAPILLARY
Glucose-Capillary: 109 mg/dL — ABNORMAL HIGH (ref 70–99)
Glucose-Capillary: 106 mg/dL — ABNORMAL HIGH (ref 70–99)

## 2013-01-27 SURGERY — CORONARY ARTERY BYPASS GRAFTING (CABG)
Anesthesia: General | Site: Chest | Wound class: Clean

## 2013-01-27 MED ORDER — METOPROLOL TARTRATE 25 MG/10 ML ORAL SUSPENSION
12.5000 mg | Freq: Two times a day (BID) | ORAL | Status: DC
  Filled 2013-01-27 (×5): qty 5

## 2013-01-27 MED ORDER — ACETAMINOPHEN 160 MG/5ML PO SOLN: 650.0000 mg | Freq: Once | ORAL | Status: AC

## 2013-01-27 MED ORDER — SODIUM CHLORIDE 0.9 % IV SOLN
INTRAVENOUS | Status: DC
  Filled 2013-01-27: qty 1

## 2013-01-27 MED ORDER — SODIUM CHLORIDE 0.9 % IJ SOLN: 3.0000 mL | INTRAMUSCULAR | Status: DC | PRN

## 2013-01-27 MED ORDER — DEXMEDETOMIDINE HCL IN NACL 200 MCG/50ML IV SOLN: 0.1000 ug/kg/h | INTRAVENOUS | Status: DC

## 2013-01-27 MED ORDER — SODIUM CHLORIDE 0.9 % IV SOLN
10.0000 g | INTRAVENOUS | Status: DC | PRN
  Administered 2013-01-27: 5 g/h via INTRAVENOUS

## 2013-01-27 MED ORDER — POTASSIUM CHLORIDE 10 MEQ/50ML IV SOLN: 10.0000 meq | INTRAVENOUS | Status: AC

## 2013-01-27 MED ORDER — SODIUM CHLORIDE 0.9 % IV SOLN
INTRAVENOUS | Status: DC
  Administered 2013-01-27: 14:00:00 via INTRAVENOUS

## 2013-01-27 MED ORDER — MAGNESIUM SULFATE 40 MG/ML IJ SOLN
4.0000 g | Freq: Once | INTRAMUSCULAR | Status: AC
  Administered 2013-01-27: 4 g via INTRAVENOUS
  Filled 2013-01-27: qty 100

## 2013-01-27 MED ORDER — PHENYLEPHRINE HCL 10 MG/ML IJ SOLN
INTRAMUSCULAR | Status: DC | PRN
  Administered 2013-01-27: 40 ug via INTRAVENOUS

## 2013-01-27 MED ORDER — OXYCODONE HCL 5 MG PO TABS
5.0000 mg | ORAL_TABLET | ORAL | Status: DC | PRN
  Administered 2013-01-29: 5 mg via ORAL
  Filled 2013-01-27: qty 1

## 2013-01-27 MED ORDER — ETOMIDATE 2 MG/ML IV SOLN
INTRAVENOUS | Status: DC | PRN
  Administered 2013-01-27: 16 mg via INTRAVENOUS

## 2013-01-27 MED ORDER — FAMOTIDINE IN NACL 20-0.9 MG/50ML-% IV SOLN: 20.0000 mg | Freq: Two times a day (BID) | INTRAVENOUS | Status: DC

## 2013-01-27 MED ORDER — LACTATED RINGERS IV SOLN: 500.0000 mL | Freq: Once | INTRAVENOUS | Status: AC | PRN

## 2013-01-27 MED ORDER — MIDAZOLAM HCL 5 MG/5ML IJ SOLN
INTRAMUSCULAR | Status: DC | PRN
  Administered 2013-01-27: 3 mg via INTRAVENOUS
  Administered 2013-01-27: 2 mg via INTRAVENOUS
  Administered 2013-01-27 (×2): 1 mg via INTRAVENOUS
  Administered 2013-01-27: 2 mg via INTRAVENOUS
  Administered 2013-01-27: 1 mg via INTRAVENOUS

## 2013-01-27 MED ORDER — SODIUM CHLORIDE 0.45 % IV SOLN
INTRAVENOUS | Status: DC
  Administered 2013-01-27: 14:00:00 via INTRAVENOUS

## 2013-01-27 MED ORDER — METOPROLOL TARTRATE 1 MG/ML IV SOLN: 2.5000 mg | INTRAVENOUS | Status: DC | PRN

## 2013-01-27 MED ORDER — ALBUMIN HUMAN 5 % IV SOLN
250.0000 mL | INTRAVENOUS | Status: AC | PRN
  Administered 2013-01-27 – 2013-01-28 (×4): 250 mL via INTRAVENOUS
  Filled 2013-01-27: qty 250

## 2013-01-27 MED ORDER — HEPARIN SODIUM (PORCINE) 1000 UNIT/ML IJ SOLN
INTRAMUSCULAR | Status: DC | PRN
  Administered 2013-01-27: 25000 [IU] via INTRAVENOUS

## 2013-01-27 MED ORDER — DOPAMINE-DEXTROSE 3.2-5 MG/ML-% IV SOLN
INTRAVENOUS | Status: DC | PRN
  Administered 2013-01-27: 3 ug/kg/min via INTRAVENOUS

## 2013-01-27 MED ORDER — LACTATED RINGERS IV SOLN
INTRAVENOUS | Status: DC
  Administered 2013-01-27: 14:00:00 via INTRAVENOUS

## 2013-01-27 MED ORDER — THROMBIN 20000 UNITS EX SOLR
CUTANEOUS | Status: AC
  Filled 2013-01-27: qty 20000

## 2013-01-27 MED ORDER — VANCOMYCIN HCL IN DEXTROSE 1-5 GM/200ML-% IV SOLN
1000.0000 mg | Freq: Once | INTRAVENOUS | Status: AC
  Administered 2013-01-27: 1000 mg via INTRAVENOUS
  Filled 2013-01-27: qty 200

## 2013-01-27 MED ORDER — CEFUROXIME SODIUM 1.5 G IJ SOLR
1.5000 g | Freq: Two times a day (BID) | INTRAMUSCULAR | Status: DC
  Administered 2013-01-28 – 2013-01-29 (×4): 1.5 g via INTRAVENOUS
  Filled 2013-01-27 (×5): qty 1.5

## 2013-01-27 MED ORDER — DOPAMINE-DEXTROSE 3.2-5 MG/ML-% IV SOLN: 0.0000 ug/kg/min | INTRAVENOUS | Status: DC

## 2013-01-27 MED ORDER — PHENYLEPHRINE HCL 10 MG/ML IJ SOLN
0.0000 ug/min | INTRAVENOUS | Status: DC
  Filled 2013-01-27 (×2): qty 2

## 2013-01-27 MED ORDER — NITROGLYCERIN IN D5W 200-5 MCG/ML-% IV SOLN: 0.0000 ug/min | INTRAVENOUS | Status: DC

## 2013-01-27 MED ORDER — 0.9 % SODIUM CHLORIDE (POUR BTL) OPTIME
TOPICAL | Status: DC | PRN
  Administered 2013-01-27: 6000 mL

## 2013-01-27 MED ORDER — MORPHINE SULFATE 2 MG/ML IJ SOLN: 1.0000 mg | INTRAMUSCULAR | Status: AC | PRN

## 2013-01-27 MED ORDER — MIDAZOLAM HCL 2 MG/2ML IJ SOLN: 2.0000 mg | INTRAMUSCULAR | Status: DC | PRN

## 2013-01-27 MED ORDER — ASPIRIN EC 325 MG PO TBEC
325.0000 mg | DELAYED_RELEASE_TABLET | Freq: Every day | ORAL | Status: DC
  Administered 2013-01-28: 325 mg via ORAL
  Filled 2013-01-27 (×2): qty 1

## 2013-01-27 MED ORDER — VECURONIUM BROMIDE 10 MG IV SOLR
INTRAVENOUS | Status: DC | PRN
  Administered 2013-01-27: 10 mg via INTRAVENOUS

## 2013-01-27 MED ORDER — METOCLOPRAMIDE HCL 5 MG/ML IJ SOLN
10.0000 mg | Freq: Four times a day (QID) | INTRAMUSCULAR | Status: AC
  Administered 2013-01-27 – 2013-01-28 (×4): 10 mg via INTRAVENOUS
  Filled 2013-01-27 (×5): qty 2

## 2013-01-27 MED ORDER — ACETAMINOPHEN 650 MG RE SUPP
650.0000 mg | Freq: Once | RECTAL | Status: AC
  Administered 2013-01-27: 650 mg via RECTAL

## 2013-01-27 MED ORDER — SODIUM CHLORIDE 0.9 % IV SOLN: 250.0000 mL | INTRAVENOUS | Status: DC

## 2013-01-27 MED ORDER — THROMBIN 20000 UNITS EX SOLR
OROMUCOSAL | Status: DC | PRN
  Administered 2013-01-27 (×3): via TOPICAL

## 2013-01-27 MED ORDER — ALBUMIN HUMAN 5 % IV SOLN
INTRAVENOUS | Status: DC | PRN
  Administered 2013-01-27: 13:00:00 via INTRAVENOUS

## 2013-01-27 MED ORDER — DOCUSATE SODIUM 100 MG PO CAPS
200.0000 mg | ORAL_CAPSULE | Freq: Every day | ORAL | Status: DC
  Administered 2013-01-28: 200 mg via ORAL
  Filled 2013-01-27: qty 2

## 2013-01-27 MED ORDER — BISACODYL 10 MG RE SUPP: 10.0000 mg | Freq: Every day | RECTAL | Status: DC

## 2013-01-27 MED ORDER — INSULIN ASPART 100 UNIT/ML ~~LOC~~ SOLN
0.0000 [IU] | SUBCUTANEOUS | Status: DC
  Administered 2013-01-27: 2 [IU] via SUBCUTANEOUS
  Administered 2013-01-28: 01:00:00 via SUBCUTANEOUS
  Administered 2013-01-28 (×2): 2 [IU] via SUBCUTANEOUS

## 2013-01-27 MED ORDER — HEMOSTATIC AGENTS (NO CHARGE) OPTIME
TOPICAL | Status: DC | PRN
  Administered 2013-01-27: 1 via TOPICAL

## 2013-01-27 MED ORDER — ONDANSETRON HCL 4 MG/2ML IJ SOLN: 4.0000 mg | Freq: Four times a day (QID) | INTRAMUSCULAR | Status: DC | PRN

## 2013-01-27 MED ORDER — SODIUM CHLORIDE 0.9 % IJ SOLN
3.0000 mL | Freq: Two times a day (BID) | INTRAMUSCULAR | Status: DC
  Administered 2013-01-28 – 2013-01-29 (×3): 3 mL via INTRAVENOUS

## 2013-01-27 MED ORDER — ROCURONIUM BROMIDE 100 MG/10ML IV SOLN
INTRAVENOUS | Status: DC | PRN
  Administered 2013-01-27: 50 mg via INTRAVENOUS

## 2013-01-27 MED ORDER — LACTATED RINGERS IV SOLN
INTRAVENOUS | Status: DC | PRN
  Administered 2013-01-27 (×2): via INTRAVENOUS

## 2013-01-27 MED ORDER — INSULIN REGULAR BOLUS VIA INFUSION
0.0000 [IU] | Freq: Three times a day (TID) | INTRAVENOUS | Status: DC
  Filled 2013-01-27: qty 10

## 2013-01-27 MED ORDER — PROTAMINE SULFATE 10 MG/ML IV SOLN
INTRAVENOUS | Status: DC | PRN
  Administered 2013-01-27: 20 mg via INTRAVENOUS

## 2013-01-27 MED ORDER — ACETAMINOPHEN 500 MG PO TABS
1000.0000 mg | ORAL_TABLET | Freq: Four times a day (QID) | ORAL | Status: DC
  Administered 2013-01-28 – 2013-01-29 (×5): 1000 mg via ORAL
  Filled 2013-01-27 (×10): qty 2

## 2013-01-27 MED ORDER — ACETAMINOPHEN 160 MG/5ML PO SOLN: 1000.0000 mg | Freq: Four times a day (QID) | ORAL | Status: DC

## 2013-01-27 MED ORDER — BISACODYL 5 MG PO TBEC
10.0000 mg | DELAYED_RELEASE_TABLET | Freq: Every day | ORAL | Status: DC
  Administered 2013-01-28: 10 mg via ORAL
  Filled 2013-01-27: qty 2

## 2013-01-27 MED ORDER — METOPROLOL TARTRATE 12.5 MG HALF TABLET
12.5000 mg | ORAL_TABLET | Freq: Two times a day (BID) | ORAL | Status: DC
  Administered 2013-01-28: 12.5 mg via ORAL
  Filled 2013-01-27 (×5): qty 1

## 2013-01-27 MED ORDER — ARTIFICIAL TEARS OP OINT
TOPICAL_OINTMENT | OPHTHALMIC | Status: DC | PRN
  Administered 2013-01-27: 1 via OPHTHALMIC

## 2013-01-27 MED ORDER — PANTOPRAZOLE SODIUM 40 MG PO TBEC
40.0000 mg | DELAYED_RELEASE_TABLET | Freq: Every day | ORAL | Status: DC
  Filled 2013-01-27: qty 1

## 2013-01-27 MED ORDER — FENTANYL CITRATE 0.05 MG/ML IJ SOLN
INTRAMUSCULAR | Status: DC | PRN
  Administered 2013-01-27 (×2): 50 ug via INTRAVENOUS
  Administered 2013-01-27 (×2): 250 ug via INTRAVENOUS
  Administered 2013-01-27: 50 ug via INTRAVENOUS
  Administered 2013-01-27: 250 ug via INTRAVENOUS
  Administered 2013-01-27: 150 ug via INTRAVENOUS
  Administered 2013-01-27: 50 ug via INTRAVENOUS
  Administered 2013-01-27: 100 ug via INTRAVENOUS
  Administered 2013-01-27: 50 ug via INTRAVENOUS
  Administered 2013-01-27: 250 ug via INTRAVENOUS

## 2013-01-27 MED ORDER — MORPHINE SULFATE 2 MG/ML IJ SOLN
2.0000 mg | INTRAMUSCULAR | Status: DC | PRN
  Administered 2013-01-28 (×2): 2 mg via INTRAVENOUS
  Filled 2013-01-27 (×2): qty 1

## 2013-01-27 MED ORDER — ASPIRIN 81 MG PO CHEW: 324.0000 mg | CHEWABLE_TABLET | Freq: Every day | ORAL | Status: DC

## 2013-01-27 SURGICAL SUPPLY — 105 items
APL SKNCLS STERI-STRIP NONHPOA (GAUZE/BANDAGES/DRESSINGS) ×1
ATTRACTOMAT 16X20 MAGNETIC DRP (DRAPES) ×2 IMPLANT
BAG DECANTER FOR FLEXI CONT (MISCELLANEOUS) ×2 IMPLANT
BANDAGE ELASTIC 4 VELCRO ST LF (GAUZE/BANDAGES/DRESSINGS) ×2 IMPLANT
BANDAGE ELASTIC 6 VELCRO ST LF (GAUZE/BANDAGES/DRESSINGS) ×2 IMPLANT
BANDAGE GAUZE ELAST BULKY 4 IN (GAUZE/BANDAGES/DRESSINGS) ×2 IMPLANT
BASKET HEART (ORDER IN 25'S) (MISCELLANEOUS) ×1
BASKET HEART (ORDER IN 25S) (MISCELLANEOUS) ×1 IMPLANT
BENZOIN TINCTURE PRP APPL 2/3 (GAUZE/BANDAGES/DRESSINGS) ×1 IMPLANT
BLADE STERNUM SYSTEM 6 (BLADE) ×2 IMPLANT
CANISTER SUCTION 2500CC (MISCELLANEOUS) ×2 IMPLANT
CANN PRFSN .5XCNCT 15X34-48 (MISCELLANEOUS)
CANNULA MALLEABLE SINGLE 40FR (CANNULA) ×1 IMPLANT
CANNULA PRFSN .5XCNCT 15X34-48 (MISCELLANEOUS) IMPLANT
CANNULA VEN 2 STAGE (MISCELLANEOUS)
CANNULA VENOUS LOW PROF 34X46 (CANNULA) ×2 IMPLANT
CATH ROBINSON RED A/P 18FR (CATHETERS) ×4 IMPLANT
CATH THORACIC 28FR (CATHETERS) ×2 IMPLANT
CATH THORACIC 28FR RT ANG (CATHETERS) IMPLANT
CATH THORACIC 36FR (CATHETERS) ×2 IMPLANT
CATH THORACIC 36FR RT ANG (CATHETERS) ×2 IMPLANT
CLIP TI MEDIUM 24 (CLIP) IMPLANT
CLIP TI WIDE RED SMALL 24 (CLIP) ×1 IMPLANT
CLOSURE STERI-STRIP 1/4X4 (GAUZE/BANDAGES/DRESSINGS) ×1 IMPLANT
CLOTH BEACON ORANGE TIMEOUT ST (SAFETY) ×2 IMPLANT
COVER SURGICAL LIGHT HANDLE (MISCELLANEOUS) ×2 IMPLANT
CRADLE DONUT ADULT HEAD (MISCELLANEOUS) ×2 IMPLANT
DRAPE CARDIOVASCULAR INCISE (DRAPES) ×2
DRAPE SLUSH/WARMER DISC (DRAPES) ×1 IMPLANT
DRAPE SRG 135X102X78XABS (DRAPES) ×1 IMPLANT
DRSG COVADERM 4X14 (GAUZE/BANDAGES/DRESSINGS) ×2 IMPLANT
ELECT CAUTERY BLADE 6.4 (BLADE) ×2 IMPLANT
ELECT REM PT RETURN 9FT ADLT (ELECTROSURGICAL) ×4
ELECTRODE REM PT RTRN 9FT ADLT (ELECTROSURGICAL) ×2 IMPLANT
GLOVE BIO SURGEON STRL SZ 6 (GLOVE) ×4 IMPLANT
GLOVE BIO SURGEON STRL SZ 6.5 (GLOVE) ×4 IMPLANT
GLOVE BIO SURGEON STRL SZ7 (GLOVE) IMPLANT
GLOVE BIO SURGEON STRL SZ7.5 (GLOVE) IMPLANT
GLOVE BIOGEL PI IND STRL 6 (GLOVE) IMPLANT
GLOVE BIOGEL PI IND STRL 6.5 (GLOVE) IMPLANT
GLOVE BIOGEL PI IND STRL 7.0 (GLOVE) IMPLANT
GLOVE BIOGEL PI INDICATOR 6 (GLOVE) ×2
GLOVE BIOGEL PI INDICATOR 6.5 (GLOVE) ×3
GLOVE BIOGEL PI INDICATOR 7.0 (GLOVE)
GLOVE EUDERMIC 7 POWDERFREE (GLOVE) ×4 IMPLANT
GLOVE ORTHO TXT STRL SZ7.5 (GLOVE) IMPLANT
GOWN PREVENTION PLUS XLARGE (GOWN DISPOSABLE) ×2 IMPLANT
GOWN STRL NON-REIN LRG LVL3 (GOWN DISPOSABLE) ×11 IMPLANT
HEMOSTAT POWDER SURGIFOAM 1G (HEMOSTASIS) ×6 IMPLANT
HEMOSTAT SURGICEL 2X14 (HEMOSTASIS) ×2 IMPLANT
INSERT FOGARTY 61MM (MISCELLANEOUS) IMPLANT
INSERT FOGARTY XLG (MISCELLANEOUS) IMPLANT
KIT BASIN OR (CUSTOM PROCEDURE TRAY) ×2 IMPLANT
KIT CATH CPB BARTLE (MISCELLANEOUS) ×2 IMPLANT
KIT ROOM TURNOVER OR (KITS) ×2 IMPLANT
KIT SUCTION CATH 14FR (SUCTIONS) ×2 IMPLANT
KIT VASOVIEW W/TROCAR VH 2000 (KITS) ×2 IMPLANT
NS IRRIG 1000ML POUR BTL (IV SOLUTION) ×10 IMPLANT
PACK OPEN HEART (CUSTOM PROCEDURE TRAY) ×2 IMPLANT
PAD ARMBOARD 7.5X6 YLW CONV (MISCELLANEOUS) ×4 IMPLANT
PAD ELECT DEFIB RADIOL ZOLL (MISCELLANEOUS) ×2 IMPLANT
PENCIL BUTTON HOLSTER BLD 10FT (ELECTRODE) ×2 IMPLANT
PUNCH AORTIC ROTATE 4.0MM (MISCELLANEOUS) IMPLANT
PUNCH AORTIC ROTATE 4.5MM 8IN (MISCELLANEOUS) ×2 IMPLANT
PUNCH AORTIC ROTATE 5MM 8IN (MISCELLANEOUS) IMPLANT
SET CARDIOPLEGIA MPS 5001102 (MISCELLANEOUS) ×1 IMPLANT
SPONGE GAUZE 4X4 12PLY (GAUZE/BANDAGES/DRESSINGS) ×4 IMPLANT
SPONGE INTESTINAL PEANUT (DISPOSABLE) IMPLANT
SPONGE LAP 18X18 X RAY DECT (DISPOSABLE) ×1 IMPLANT
SPONGE LAP 4X18 X RAY DECT (DISPOSABLE) ×1 IMPLANT
SUT BONE WAX W31G (SUTURE) ×2 IMPLANT
SUT MNCRL AB 4-0 PS2 18 (SUTURE) IMPLANT
SUT PROLENE 3 0 SH DA (SUTURE) IMPLANT
SUT PROLENE 3 0 SH1 36 (SUTURE) ×2 IMPLANT
SUT PROLENE 4 0 RB 1 (SUTURE)
SUT PROLENE 4 0 SH DA (SUTURE) IMPLANT
SUT PROLENE 4-0 RB1 .5 CRCL 36 (SUTURE) IMPLANT
SUT PROLENE 5 0 C 1 36 (SUTURE) IMPLANT
SUT PROLENE 6 0 C 1 30 (SUTURE) ×2 IMPLANT
SUT PROLENE 7 0 BV 1 (SUTURE) IMPLANT
SUT PROLENE 7 0 BV1 MDA (SUTURE) ×3 IMPLANT
SUT PROLENE 8 0 BV175 6 (SUTURE) IMPLANT
SUT SILK  1 MH (SUTURE)
SUT SILK 1 MH (SUTURE) IMPLANT
SUT STEEL STERNAL CCS#1 18IN (SUTURE) IMPLANT
SUT STEEL SZ 6 DBL 3X14 BALL (SUTURE) IMPLANT
SUT VIC AB 1 CTX 36 (SUTURE) ×4
SUT VIC AB 1 CTX36XBRD ANBCTR (SUTURE) ×2 IMPLANT
SUT VIC AB 2-0 CT1 27 (SUTURE)
SUT VIC AB 2-0 CT1 TAPERPNT 27 (SUTURE) IMPLANT
SUT VIC AB 2-0 CTX 27 (SUTURE) IMPLANT
SUT VIC AB 3-0 SH 27 (SUTURE)
SUT VIC AB 3-0 SH 27X BRD (SUTURE) IMPLANT
SUT VIC AB 3-0 X1 27 (SUTURE) IMPLANT
SUT VICRYL 4-0 PS2 18IN ABS (SUTURE) IMPLANT
SUTURE E-PAK OPEN HEART (SUTURE) ×2 IMPLANT
SYSTEM SAHARA CHEST DRAIN ATS (WOUND CARE) ×2 IMPLANT
TAPE CLOTH SURG 4X10 WHT LF (GAUZE/BANDAGES/DRESSINGS) ×1 IMPLANT
TOWEL OR 17X24 6PK STRL BLUE (TOWEL DISPOSABLE) ×2 IMPLANT
TOWEL OR 17X26 10 PK STRL BLUE (TOWEL DISPOSABLE) ×2 IMPLANT
TRAY FOLEY IC TEMP SENS 16FR (CATHETERS) ×1 IMPLANT
TUBE SUCT INTRACARD DLP 20F (MISCELLANEOUS) ×2 IMPLANT
TUBING INSUFFLATION 10FT LAP (TUBING) ×2 IMPLANT
UNDERPAD 30X30 INCONTINENT (UNDERPADS AND DIAPERS) ×2 IMPLANT
WATER STERILE IRR 1000ML POUR (IV SOLUTION) ×4 IMPLANT

## 2013-01-27 NOTE — Transfer of Care (Signed)
Immediate Anesthesia Transfer of Care Note  Patient: Kathryn Dixon  Procedure(s) Performed: Procedure(s): CORONARY ARTERY BYPASS GRAFTING times two using Right Greater Saphenous Vein Graft [place] Endoscopically and Left Internal Mammary Artery (N/A) INTRAOPERATIVE TRANSESOPHAGEAL ECHOCARDIOGRAM (N/A)  Patient Location: SICU  Anesthesia Type:General  Level of Consciousness: sedated  Airway & Oxygen Therapy: Patient remains intubated per anesthesia plan and Patient placed on Ventilator (see vital sign flow sheet for setting)  Post-op Assessment: Post -op Vital signs reviewed and stable  Post vital signs: Reviewed and stable  Complications: No apparent anesthesia complications

## 2013-01-27 NOTE — OR Nursing (Signed)
First call made to 2300 at 1218; second call made at 1232; spoke with [facility].

## 2013-01-27 NOTE — Procedures (Signed)
Extubation Procedure Note  Patient Details:   Name: Kathryn Dixon DOB: [birth year] MRN: [id number]   Airway Documentation:   Patient extubated to 4 lpm nasal cannula.  VC 800 ml, NIF -30, able to lift head off bed.  Able to breate around deflated cuff and vocalize post procedure.  Tolerated well, no complications noted.   Evaluation  O2 sats: stable throughout Complications: No apparent complications Patient did tolerate procedure well. Bilateral Breath Sounds: Rhonchi Suctioning: Oral Yes  Maxie Debose, [place] Gell 01/27/2013, 10:41 PM

## 2013-01-27 NOTE — Progress Notes (Signed)
Weaning parameters done with patient, VC 550 ml, NIF -25, patient lethargic and opens eyes, but does not follow.  ABG and weaning parameters reviewed with RN, patient placed back on full support until more alert.

## 2013-01-27 NOTE — Op Note (Signed)
CARDIOVASCULAR SURGERY OPERATIVE NOTE  01/27/2013  Surgeon:  Alleen Borne, MD  First Assistant: Coral Ceo, [facility]   Preoperative Diagnosis:  Severe multi-vessel coronary artery disease   Postoperative Diagnosis:  Same   Procedure:  1. Median Sternotomy 2. Extracorporeal circulation 3.   Coronary artery bypass grafting x 2   Left internal mammary graft to the LAD  SVG to OM 1  4.   Endoscopic vein harvest from the right leg   Anesthesia:  General Endotracheal   Clinical History/Surgical Indication:  The patient is a 67 year old black female smoker with HTN and a history of coronary artery disease, s/p MI 15-20 years ago. She has not followed up since. She says she was in her usual sedentary state with no symptoms until she woke up Tuesday with some shortness of breath and just did not feel right. She tried to go back to sleep but couldn't and developed worsening dyspnea prompting her to call EMS. She had no chest pain or discomfort. She ruled in for MI with POC troponin of 0.1. ECG showed LBBB that was not new. Subsequent troponin was 1.96. Her D-dimer was 11.6 and she had a CT pulmonary angiogram ruling out PE. It did show aneurysmal enlargement of her entire thoracic aorta. Cardiology was concerned about this and the possibility of dissection so cath was postponed and a CTA of the chest was done last night showing moderate enlargement of the thoracic aorta with no dissection. A 2D echo showed severe LV dysfunction with severe LVH and an EF of 20-25%. Right and left heart cath today shows severe pulmonary hypertension with borderline cardiac index of 2. There is 80% distal LM stenosis, 100% chronically occluded RCA with collaterals to the distal vessel and significant LCX disease. think CABG is indicated to try to prevent further ischemia and infarction and potentially improve her cardiac  function. She chooses to live a sedentary lifestyle and if she continues to smoke she will have a poor prognosis regardless. She did have pulmonary edema on cxr at admission and it would be best to try to resolve her CHF prior to doing surgery. She has mild-moderate enlargement of her entire thoracic aorta and I don't think this will require treatment at this time. She did have moderate MR by echo but I don't hear a murmur. This is likely ischemic in origin and can be evaluated with TEE in the OR.    Preparation:  The patient was seen in the preoperative holding area and the correct patient, correct operation were confirmed with the patient after reviewing the medical record and catheterization. The consent was signed by me. Preoperative antibiotics were given. A pulmonary arterial line and radial arterial line were placed by the anesthesia team. The patient was taken back to the operating room and positioned supine on the operating room table. After being placed under general endotracheal anesthesia by the anesthesia team a foley catheter was placed. The neck, chest, abdomen, and both legs were prepped with betadine soap and solution and draped in the usual sterile manner. A surgical time-out was taken and the correct patient and operative procedure were confirmed with the nursing and anesthesia staff.  Pre-bypass TEE:  TEE performed by Dr. Aurther Loft Massage. It showed severe LV dysfunction with EF 20%, trivial MR, trace AI.  Cardiopulmonary Bypass:  A median sternotomy was performed. The pericardium was opened in the midline. Right ventricular function appeared normal. The ascending aorta was of normal size and had no palpable plaque.  There were no contraindications to aortic cannulation or cross-clamping. The patient was fully systemically heparinized and the ACT was maintained > 400 sec. The proximal aortic arch was cannulated with a 22 F aortic cannula for arterial inflow. Venous cannulation was  performed via the right atrial appendage using a two-staged venous cannula. An antegrade cardioplegia/vent cannula was inserted into the mid-ascending aorta. Aortic occlusion was performed with a single cross-clamp. Systemic cooling to 32 degrees Centigrade and topical cooling of the heart with iced saline were used. Hyperkalemic antegrade cold blood cardioplegia was used to induce diastolic arrest and was then given at about 20 minute intervals throughout the period of arrest to maintain myocardial temperature at or below 10 degrees centigrade. A temperature probe was inserted into the interventricular septum and an insulating pad was placed in the pericardium.   Left internal mammary harvest:  The left side of the sternum was retracted using the Rultract retractor. The left internal mammary artery was harvested as a pedicle graft. All side branches were clipped. It was a medium-sized vessel of good quality with excellent blood flow. It was ligated distally and divided. It was sprayed with topical papaverine solution to prevent vasospasm.   Endoscopic vein harvest:  The right greater saphenous vein was harvested endoscopically through a 2 cm incision medial to the right knee. It was harvested from the upper thigh to below the knee. It was a medium-sized vein of good quality. The side branches were all ligated with 4-0 silk ties.    Coronary arteries:  The coronary arteries were examined.   LAD:  Severe, diffuse calcific disease.  LCX:  Severe, diffuse calcific disease. The OM1 has two sub-branches. The medial sub-branch is completely calcified and not graftable. The lateral branch is also severely calcified but there was one are just after the bifurcation where the anterior wall of the vessel was soft enough to graft.  RCA:  Severe, diffuse calcific disease and not graftable.   Grafts:  1. LIMA to the LAD: 1.6 mm. It was sewn end to side using 8-0 prolene continuous suture. 2. SVG to  OM1:  1.5 mm. It was sewn end to side using 7-0 prolene continuous suture.  The proximal vein graft anastomosis was performed to the mid-ascending aorta using continuous 6-0 prolene suture. A graft marker was placed around the proximal anastomosis.   Completion:  The patient was rewarmed to 37 degrees Centigrade. The clamp was removed from the LIMA pedicle and there was rapid warming of the septum and return of ventricular fibrillation. The crossclamp was removed. There was spontaneous return of sinus rhythm. The distal and proximal anastomoses were checked for hemostasis. The position of the grafts was satisfactory. Two temporary epicardial pacing wires were placed on the right atrium and two on the right ventricle. The patient was weaned from CPB without difficulty on no inotropes. Heparin was fully reversed with protamine and the aortic and venous cannulas removed. Hemostasis was achieved. Mediastinal and left pleural drainage tubes were placed. The sternum was closed with double #6 stainless steel wires. The fascia was closed with continuous # 1 vicryl suture. The subcutaneous tissue was closed with 2-0 vicryl continuous suture. The skin was closed with 3-0 vicryl subcuticular suture. All sponge, needle, and instrument counts were reported correct at the end of the case. Dry sterile dressings were placed over the incisions and around the chest tubes which were connected to pleurevac suction. The patient was then transported to the surgical intensive care unit in critical  but stable condition.   Post-bypass TEE:  Unchanged.

## 2013-01-27 NOTE — Anesthesia Preprocedure Evaluation (Addendum)
Anesthesia Evaluation  Patient identified by MRN, date of birth, ID band Patient awake    Reviewed: Allergy & Precautions, H&P , NPO status , Patient's Chart, lab work & pertinent test results  History of Anesthesia Complications Negative for: history of anesthetic complications  Airway Mallampati: II TM Distance: >3 FB Neck ROM: Full    Dental  (+) Edentulous Upper and Edentulous Lower   Pulmonary shortness of breath and at rest, Current Smoker,          Cardiovascular hypertension, + CAD, + Past MI, + Peripheral Vascular Disease and +CHF (EF 20-25) + dysrhythmias Rhythm:Regular Rate:Normal  Ischemic cardiomyopathy,  Aneurysmal dilation of aorta   Neuro/Psych    GI/Hepatic   Endo/Other    Renal/GU      Musculoskeletal   Abdominal   Peds  Hematology   Anesthesia Other Findings   Reproductive/Obstetrics                          Anesthesia Physical Anesthesia Plan  ASA: IV  Anesthesia Plan: General   Post-op Pain Management:    Induction: Intravenous  Airway Management Planned: Oral ETT  Additional Equipment: Arterial line, PA Cath, TEE and Ultrasound Guidance Line Placement  Intra-op Plan:   Post-operative Plan: Post-operative intubation/ventilation  Informed Consent: I have reviewed the patients History and Physical, chart, labs and discussed the procedure including the risks, benefits and alternatives for the proposed anesthesia with the patient or authorized representative who has indicated his/her understanding and acceptance.   Dental advisory given  Plan Discussed with: CRNA and Surgeon  Anesthesia Plan Comments:         Anesthesia Quick Evaluation

## 2013-01-27 NOTE — Brief Op Note (Addendum)
01/18/2013 - 01/27/2013  11:27 AM  PATIENT:  Kathryn Dixon  67 y.o. female  PRE-OPERATIVE DIAGNOSIS:  Coronary Artery Disease  POST-OPERATIVE DIAGNOSIS:  Coronary Artery Disease  PROCEDURE:   CORONARY ARTERY BYPASS GRAFTING x 2 (LIMA-LAD, SVG-OM) ENDOSCOPIC VEIN HARVEST RIGHT THIGH  RCA system not graftable  SURGEON:  Alleen Borne, MD  ASSISTANT: Coral Ceo, PA-C  ANESTHESIA:   general  PATIENT CONDITION:  ICU - intubated and hemodynamically stable.  PRE-OPERATIVE WEIGHT: 54 kg

## 2013-01-27 NOTE — Progress Notes (Signed)
Intubated  BP 100/72  Pulse 91  Temp(Src) 97.7 F (36.5 C) (Core (Comment))  Resp 12  Ht 5\' 4"  (1.626 m)  Wt 119 lb 11.4 oz (54.3 kg)  BMI 20.54 kg/m2  SpO2 100% PA 31/19, CI= 2  Intake/Output Summary (Last 24 hours) at 01/27/13 1654 Last data filed at 01/27/13 1600  Gross per 24 hour  Intake 4756.04 ml  Output   6312 ml  Net -1555.96 ml      Minimal CT output  Doing well early postop  Wean to extubate

## 2013-01-28 ENCOUNTER — Inpatient Hospital Stay (HOSPITAL_COMMUNITY): Payer: Medicare Other

## 2013-01-28 ENCOUNTER — Encounter (HOSPITAL_COMMUNITY): Payer: Self-pay | Admitting: Surgery

## 2013-01-28 DIAGNOSIS — D62 Acute posthemorrhagic anemia: Secondary | ICD-10-CM | POA: Diagnosis not present

## 2013-01-28 DIAGNOSIS — Z951 Presence of aortocoronary bypass graft: Secondary | ICD-10-CM

## 2013-01-28 HISTORY — DX: Presence of aortocoronary bypass graft: Z95.1

## 2013-01-28 LAB — GLUCOSE, CAPILLARY: Glucose-Capillary: 149 mg/dL — ABNORMAL HIGH (ref 70–99)

## 2013-01-28 LAB — POCT I-STAT 3, ART BLOOD GAS (G3+)
Acid-Base Excess: 3 mmol/L — ABNORMAL HIGH (ref 0.0–2.0)
Bicarbonate: 28.8 mEq/L — ABNORMAL HIGH (ref 20.0–24.0)
TCO2: 30 mmol/L (ref 0–100)
pCO2 arterial: 49 mmHg — ABNORMAL HIGH (ref 35.0–45.0)
pCO2 arterial: 51.8 mmHg — ABNORMAL HIGH (ref 35.0–45.0)
pH, Arterial: 7.353 (ref 7.350–7.450)
pH, Arterial: 7.38 (ref 7.350–7.450)
pO2, Arterial: 124 mmHg — ABNORMAL HIGH (ref 80.0–100.0)

## 2013-01-28 LAB — CBC
HCT: 24.9 % — ABNORMAL LOW (ref 36.0–46.0)
HCT: 25 % — ABNORMAL LOW (ref 36.0–46.0)
MCHC: 33.3 g/dL (ref 30.0–36.0)
MCV: 83.9 fL (ref 78.0–100.0)
Platelets: 146 10*3/uL — ABNORMAL LOW (ref 150–400)
RBC: 2.98 MIL/uL — ABNORMAL LOW (ref 3.87–5.11)
RDW: 19.6 % — ABNORMAL HIGH (ref 11.5–15.5)
WBC: 7.3 10*3/uL (ref 4.0–10.5)
WBC: 7.5 10*3/uL (ref 4.0–10.5)

## 2013-01-28 LAB — PREPARE PLATELET PHERESIS: Unit division: 0

## 2013-01-28 LAB — BASIC METABOLIC PANEL
BUN: 14 mg/dL (ref 6–23)
Chloride: 103 mEq/L (ref 96–112)
GFR calc Af Amer: >90 mL/min (ref >90)
GFR calc non Af Amer: 85 mL/min — ABNORMAL LOW (ref >90)
Potassium: 4.4 mEq/L (ref 3.5–5.1)
Sodium: 138 mEq/L (ref 135–145)

## 2013-01-28 LAB — CREATININE, SERUM: GFR calc Af Amer: >90 mL/min (ref >90)

## 2013-01-28 LAB — URINE CULTURE: Colony Count: NO GROWTH

## 2013-01-28 LAB — POCT I-STAT, CHEM 8
Calcium, Ion: 1.29 mmol/L (ref 1.13–1.30)
Chloride: 98 mEq/L (ref 96–112)
Creatinine, Ser: 0.9 mg/dL (ref 0.50–1.10)
Glucose, Bld: 123 mg/dL — ABNORMAL HIGH (ref 70–99)
Potassium: 4 mEq/L (ref 3.5–5.1)

## 2013-01-28 LAB — MAGNESIUM: Magnesium: 2.6 mg/dL — ABNORMAL HIGH (ref 1.5–2.5)

## 2013-01-28 MED ORDER — FUROSEMIDE 10 MG/ML IJ SOLN
40.0000 mg | Freq: Once | INTRAMUSCULAR | Status: AC
  Administered 2013-01-28: 40 mg via INTRAVENOUS
  Filled 2013-01-28: qty 4

## 2013-01-28 MED ORDER — ENOXAPARIN SODIUM 40 MG/0.4ML ~~LOC~~ SOLN
40.0000 mg | Freq: Every day | SUBCUTANEOUS | Status: DC
  Administered 2013-01-28 – 2013-01-31 (×4): 40 mg via SUBCUTANEOUS
  Filled 2013-01-28 (×5): qty 0.4

## 2013-01-28 MED ORDER — INSULIN ASPART 100 UNIT/ML ~~LOC~~ SOLN
0.0000 [IU] | SUBCUTANEOUS | Status: DC
  Administered 2013-01-28: 2 [IU] via SUBCUTANEOUS

## 2013-01-28 MED ORDER — INSULIN ASPART 100 UNIT/ML ~~LOC~~ SOLN: 0.0000 [IU] | SUBCUTANEOUS | Status: DC

## 2013-01-28 MED ORDER — PANTOPRAZOLE SODIUM 40 MG PO TBEC
40.0000 mg | DELAYED_RELEASE_TABLET | Freq: Every day | ORAL | Status: DC
  Administered 2013-01-28: 40 mg via ORAL

## 2013-01-28 MED FILL — Mannitol IV Soln 20%: INTRAVENOUS | Qty: 500 | Status: AC

## 2013-01-28 MED FILL — Sodium Chloride Irrigation Soln 0.9%: Qty: 3000 | Status: AC

## 2013-01-28 MED FILL — Magnesium Sulfate Inj 50%: INTRAMUSCULAR | Qty: 10 | Status: AC

## 2013-01-28 MED FILL — Heparin Sodium (Porcine) Inj 1000 Unit/ML: INTRAMUSCULAR | Qty: 30 | Status: AC

## 2013-01-28 MED FILL — Lidocaine HCl IV Inj 20 MG/ML: INTRAVENOUS | Qty: 5 | Status: AC

## 2013-01-28 MED FILL — Sodium Chloride IV Soln 0.9%: INTRAVENOUS | Qty: 1000 | Status: AC

## 2013-01-28 MED FILL — Sodium Bicarbonate IV Soln 8.4%: INTRAVENOUS | Qty: 50 | Status: AC

## 2013-01-28 MED FILL — Electrolyte-R (PH 7.4) Solution: INTRAVENOUS | Qty: 5000 | Status: AC

## 2013-01-28 MED FILL — Potassium Chloride Inj 2 mEq/ML: INTRAVENOUS | Qty: 40 | Status: AC

## 2013-01-28 NOTE — Progress Notes (Signed)
Patient ID: Kathryn Dixon, female   DOB: Jun 18, 1945, 67 y.o.   MRN: [id number]   SICU Evening Rounds:  Hemodynamically stable  Diuresing today.  CBC    Component Value Date/Time   WBC 7.5 01/28/2013 1640   RBC 2.98* 01/28/2013 1640   HGB 8.2* 01/28/2013 1640   HCT 25.0* 01/28/2013 1640   PLT 133* 01/28/2013 1640   MCV 83.9 01/28/2013 1640   MCH 27.5 01/28/2013 1640   MCHC 32.8 01/28/2013 1640   RDW 19.7* 01/28/2013 1640   LYMPHSABS 1.5 08/11/2010 1632   MONOABS 0.6 08/11/2010 1632   EOSABS 0.0 08/11/2010 1632   BASOSABS 0.1 08/11/2010 1632    BMET    Component Value Date/Time   NA 138 01/28/2013 1639   K 4.0 01/28/2013 1639   CL 98 01/28/2013 1639   CO2 27 01/28/2013 0400   GLUCOSE 123* 01/28/2013 1639   BUN 13 01/28/2013 1639   CREATININE 0.79 01/28/2013 1640   CALCIUM 9.5 01/28/2013 0400   GFRNONAA 84* 01/28/2013 1640   GFRAA >90 01/28/2013 1640    Stable postop course.

## 2013-01-28 NOTE — Op Note (Signed)
NAME:  Kathryn Dixon, Kathryn Dixon NO.:  [id number]  MEDICAL RECORD NO.:  [id number]  LOCATION:  2S16C                        FACILITY:  [facility]  PHYSICIAN:  Burna Forts, M.D.DATE OF BIRTH:  September 13, 1945  DATE OF PROCEDURE:  01/27/2013 DATE OF DISCHARGE:                              OPERATIVE REPORT   INTRAOPERATIVE TRANSESOPHAGEAL ECHOCARDIOGRAPHIC REPORT  INDICATION FOR PROCEDURE:  Kathryn Dixon is a 68 year old female patient of Dr. Nicki Guadalajara, who presents today for coronary artery bypass grafting by Dr. Evelene Croon.  We have been asked to place the TEE probe for evaluation of cardiac structures during and throughout the procedure.  DESCRIPTION OF PROCEDURE:  The patient is brought to the holding area on the morning of surgery where under local anesthesia with sedation, pulmonary artery and radial arterial lines were inserted.  She has been taken to the OR for routine induction of general anesthesia, after which the TEE probe was prepared and passed oropharyngeally into the stomach, then slightly withdrawn for imaging of the cardiac structures.  Pre-cardiopulmonary bypass tee examination:  Left ventricle:  The left ventricular chamber is seen initially in the short axis view.  There is significant global dysfunction and depression of the myocardium.  Estimated ejection fraction is in the range of 20% to 25%.  There is significant anterior and anteroseptal severe hypokinesis noted.  The inferior wall was not well imaged, but appears to be hypokinetic as well.  Mitral valve:  The mitral valve is seen initially in the 4-chamber view. The leaflets are thin, compliant, and mobile.  Multiple views are carried out.  Color Doppler reveals only trace mitral regurgitant flow.  Left atrium:  Normal left atrial chamber appreciated.  No masses appreciated within.  The interatrial septum is interrogated and is intact.  Aortic valve:  The aortic valve is seen in the short  axis view and reveals it to be trileaflet.  The area of the aortic valve or the diameter of the aortic valve at this level was approximately 3 cm.  On pull back, the ascending aorta is slightly dilated above the level of the sino-tubular junction.  Diameter is measured at 3.6 to 3.7 cm.  On further pullback and reversal to look at the descending aorta, we could see a large clot filling the descending aorta as it ascends to the transverse part of the aorta.  We could image the lumen of the aorta with ease and see blood flow to that, but the wall itself and inside was significant for a fairly large thrombus apparent.  Right ventricle:  Normal contractile right ventricular chamber.  Tricuspid valve:  Normal, thin, compliant tricuspid valve.  Right atrium:  Normal right atrial chamber is appreciated.  Pulmonary artery catheter is noted within.  The patient was placed on cardiopulmonary bypass.  Coronary artery bypass grafting was carried out.  The patient was rewarmed and separated from cardiopulmonary bypass with the initial attempt.  Post-cardiopulmonary bypass TEE examination:  Left ventricle:  The left ventricular chamber is seen early in the early bypass period in the short axis view.  There is mildly increased antero-lateral wall contractility now apparent.  There is some improvement in the lateral wall  contractile pattern with time and separation of cardiopulmonary bypass.  The anterior and anteroseptal wall of the heart remains significantly hypokinetic.  Overall, there remains global myocardial depression, but with time and separation from cardiopulmonary bypass, this has mildly improved prior to transfer to the cardiac intensive care unit.  Images obtained of the other cardiac structures were without significant change.  The patient was ultimately returned to the cardiac intensive care unit in serious but stable condition.          ______________________________ Burna Forts, M.D.     JTM/MEDQ  D:  01/27/2013  T:  01/28/2013  Job:  [id number]

## 2013-01-28 NOTE — Progress Notes (Signed)
POD #1 s/p CAGB x2 (LIMA-LAD, SVG-OM)  Subjective:  Awake & alert. Weaned off pressors this AM with plans for tubes & lines removed  Objective:  Vital Signs in the last 24 hours: Temp:  [97 F (36.1 C)-99.7 F (37.6 C)] 99.1 F (37.3 C) (09/05 0700) Pulse Rate:  [83-100] 93 (09/05 0700) Resp:  [11-28] 18 (09/05 0700) BP: (73-119)/(38-77) 101/63 mmHg (09/05 0700) SpO2:  [95 %-100 %] 99 % (09/05 0700) Arterial Line BP: (64-133)/(43-84) 107/54 mmHg (09/05 0700) FiO2 (%):  [40 %-50 %] 40 % (09/04 2145) Weight:  [119 lb 11.4 oz (54.3 kg)-131 lb 2.8 oz (59.5 kg)] 131 lb 2.8 oz (59.5 kg) (09/05 0600)  Intake/Output from previous day: 09/04 0701 - 09/05 0700 In: 5872.4 [I.V.:3442.4; Blood:870; IV Piggyback:1560] Out: 8237 [Urine:5950; Blood:1917; Chest Tube:370] Intake/Output from this shift: Total I/O In: -  Out: 50 [Urine:50]  Physical Exam: General appearance: alert, cooperative and chronically ill, but non-toxic Neck: no adenopathy, no carotid bruit, no JVD and supple, symmetrical, trachea midline Lungs: bibasal rales noted. Heart: regular rate and rhythm, S1, S2 normal and no M/R/G Abdomen: soft, non-tender; bowel sounds normal; no masses,  no organomegaly Extremities: edema TRACE Neurologic: Grossly normal; a bit groggy  Lab Results:  Recent Labs  01/27/13 1930 01/27/13 2016 01/28/13 0400  WBC 7.7  --  7.3  HGB 9.1* 9.9* 8.3*  PLT 176  --  146*    Recent Labs  01/27/13 0620  01/27/13 2016 01/28/13 0400  NA 145  < > 141 138  K 4.7  < > 4.5 4.4  CL 108  --  105 103  CO2 26  --   --  27  GLUCOSE 116*  < > 152* 146*  BUN 30*  --  18 14  CREATININE 0.88  < > 0.90 0.75  < > = values in this interval not displayed.  Imaging: Dg Chest 2 View  01/26/2013   *RADIOLOGY REPORT*  Clinical Data: Preoperative evaluation for CABG, history smoking, hypertension, ischemic cardiomyopathy, thoracic aortic aneurysm  CHEST - 2 VIEW  Comparison: 01/18/2013  Findings:  Enlargement of cardiac silhouette. Atherosclerotic calcification of a tortuous and aneurysmal descending thoracic aorta. Gaseous distention of the esophagus with an air-fluid level at the mid thorax. Pulmonary vascularity normal. Lungs grossly clear. No pleural effusion or pneumothorax. Bones demineralized.  IMPRESSION: Calcified tortuous and aneurysmal descending thoracic aorta. Enlargement of cardiac silhouette. No acute infiltrates. Dilated thoracic esophagus with air-fluid level at the mid chest question distal esophageal obstruction; consider assessment by esophagram to evaluate.   Original Report Authenticated By: Ulyses Southward, M.D.   Dg Chest Portable 1 View In Am  01/28/2013   *RADIOLOGY REPORT*  Clinical Data: Postop  PORTABLE CHEST - 1 VIEW  Comparison: 01/27/2013  Findings: Endotracheal tube removed.  NG removed.  Left chest tube remains in place.  Swan-Ganz catheter in the left lower lobe pulmonary artery unchanged.  Mediastinal and pericardial drains in place.  Negative for pneumothorax.  Increase in bibasilar atelectasis and small effusion.  Possible mild edema.  IMPRESSION: Hypoventilation with increase in bibasilar atelectasis and effusion.  Question mild edema.   Original Report Authenticated By: Janeece Riggers, M.D.   Dg Chest Portable 1 View  01/27/2013   CLINICAL DATA:  Coronary artery disease. Postop from CABG.  EXAM: PORTABLE CHEST - 1 VIEW  COMPARISON:  01/26/2013  FINDINGS: The patient has undergone interval CABG. Swan-Ganz catheter tip overlies the proximal left lower lobe pulmonary artery. Endotracheal tube, mediastinal  drains, nasogastric tube and left chest tube are seen in appropriate position. No evidence of pneumothorax.  Moderate cardiomegaly is stable. There is mild diffuse interstitial edema. No evidence of pulmonary consolidation or pleural effusion.  IMPRESSION: Postoperative chest with mild diffuse pulmonary edema. Stable cardiomegaly. No pneumothorax visualized.   Electronically  Signed   By: Myles Rosenthal   On: 01/27/2013 14:11    Cardiac Studies:  Assessment/Plan:  Principal Problem:   NSTEMI (non-ST elevated myocardial infarction) Active Problems:   CAD, diffuse by cath 01/20/13- for CABG   Acute combined systolic and diastolic congestive heart failure, NYHA class 3   S/P CABG x 2 - LIMA-LAD, SVG-OM   UTI (urinary tract infection)-  E.Coli, TREATED with Cipro   Dyslipidemia   Postoperative anemia due to acute blood loss   LBBB (left bundle branch block)   Tobacco abuse   Aneurysmal dilatation both ascending and descending thoracic aorta wth mural thrombus   Dyspnea, acute   Hypokalemia   Cardiomyopathy, ischemic 20-25% with moderate MR  Relatively stable POD #1. BPs were not very high (~90s-100s) PRE-OP, would not expect BP to be very high- but seems to be tolerating BB; no room for ACE-I/ARB Severe ICM -- will probably not recover inferior wall, but would hope some improvement in EF on ~3 month post-op echo.  Will arrange for LifeVest on d/c  Expected Acute blood loss anemia - observe per CVTS Stable glycemic control Needs continuous smoking cessation counseling.   LOS: 10 days    HARDING,DAVID W 01/28/2013, 8:49 AM

## 2013-01-28 NOTE — Progress Notes (Addendum)
1 Day Post-Op Procedure(s) (LRB): CORONARY ARTERY BYPASS GRAFTING times two using Right Greater Saphenous Vein Graft Harvsted Endoscopically and Left Internal Mammary Artery (N/A) INTRAOPERATIVE TRANSESOPHAGEAL ECHOCARDIOGRAM (N/A) Subjective: No complaints  Objective: Vital signs in last 24 hours: Temp:  [97 F (36.1 C)-99.7 F (37.6 C)] 99.1 F (37.3 C) (09/05 0700) Pulse Rate:  [83-100] 93 (09/05 0700) Cardiac Rhythm:  [-] Normal sinus rhythm (09/05 0400) Resp:  [11-28] 18 (09/05 0700) BP: (73-119)/(38-77) 101/63 mmHg (09/05 0700) SpO2:  [95 %-100 %] 99 % (09/05 0700) Arterial Line BP: (64-133)/(43-84) 107/54 mmHg (09/05 0700) FiO2 (%):  [40 %-50 %] 40 % (09/04 2145) Weight:  [54.3 kg (119 lb 11.4 oz)-59.5 kg (131 lb 2.8 oz)] 59.5 kg (131 lb 2.8 oz) (09/05 0600)  Hemodynamic parameters for last 24 hours: PAP: (19-42)/(10-29) 28/16 mmHg  Intake/Output from previous day: 09/04 0701 - 09/05 0700 In: 5872.4 [I.V.:3442.4; Blood:870; IV Piggyback:1560] Out: 8187 [Urine:5900; Blood:1917; Chest Tube:370] Intake/Output this shift:    General appearance: awake but still a little sluggish Neurologic: intact Heart: regular rate and rhythm, S1, S2 normal, no murmur, click, rub or gallop Lungs: rales bilaterally Extremities: edema mild Wound: dressing dry.  Lab Results:  Recent Labs  01/27/13 1930 01/27/13 2016 01/28/13 0400  WBC 7.7  --  7.3  HGB 9.1* 9.9* 8.3*  HCT 27.9* 29.0* 24.9*  PLT 176  --  146*   BMET:  Recent Labs  01/27/13 0620  01/27/13 2016 01/28/13 0400  NA 145  < > 141 138  K 4.7  < > 4.5 4.4  CL 108  --  105 103  CO2 26  --   --  27  GLUCOSE 116*  < > 152* 146*  BUN 30*  --  18 14  CREATININE 0.88  < > 0.90 0.75  CALCIUM 10.7*  --   --  9.5  < > = values in this interval not displayed.  PT/INR:  Recent Labs  01/27/13 1300  LABPROT 15.0  INR 1.21   ABG    Component Value Date/Time   PHART 7.353 01/28/2013 0005   HCO3 28.8* 01/28/2013 0005   TCO2 30 01/28/2013 0005   ACIDBASEDEF 2.0 01/27/2013 1345   O2SAT 98.0 01/28/2013 0005   CBG (last 3)   Recent Labs  01/27/13 1730 01/27/13 2007 01/28/13 0012  GLUCAP 96 132* 149*    Assessment/Plan: S/P Procedure(s) (LRB): CORONARY ARTERY BYPASS GRAFTING times two using Right Greater Saphenous Vein Graft Harvsted Endoscopically and Left Internal Mammary Artery (N/A) INTRAOPERATIVE TRANSESOPHAGEAL ECHOCARDIOGRAM (N/A) Severe LV dysfunction  She has been hemodynamically stable and is off inotropes. Mobilize Diuresis Diabetes control d/c tubes/lines Continue foley due to patient in ICU and urinary output monitoring See progression orders Expected acute blood loss anemia: observe  LOS: 10 days    Arad Burston K 01/28/2013

## 2013-01-28 NOTE — Anesthesia Postprocedure Evaluation (Signed)
  Anesthesia Post-op Note  Patient: Kathryn Dixon  Procedure(s) Performed: Procedure(s): CORONARY ARTERY BYPASS GRAFTING times two using Right Greater Saphenous Vein Graft Harvsted Endoscopically and Left Internal Mammary Artery (N/A) INTRAOPERATIVE TRANSESOPHAGEAL ECHOCARDIOGRAM (N/A)  Patient Location: SICU  Anesthesia Type:General  Level of Consciousness: awake, alert  and oriented  Airway and Oxygen Therapy: Patient Spontanous Breathing  Post-op Pain: moderate  Post-op Assessment: Post-op Vital signs reviewed, Patient's Cardiovascular Status Stable and Respiratory Function Stable  Post-op Vital Signs: Reviewed and stable  Complications: No apparent anesthesia complications

## 2013-01-29 ENCOUNTER — Encounter (HOSPITAL_COMMUNITY): Payer: Self-pay | Admitting: Cardiology

## 2013-01-29 ENCOUNTER — Inpatient Hospital Stay (HOSPITAL_COMMUNITY): Payer: Medicare Other

## 2013-01-29 LAB — GLUCOSE, CAPILLARY
Glucose-Capillary: 109 mg/dL — ABNORMAL HIGH (ref 70–99)
Glucose-Capillary: 109 mg/dL — ABNORMAL HIGH (ref 70–99)
Glucose-Capillary: 124 mg/dL — ABNORMAL HIGH (ref 70–99)
Glucose-Capillary: 149 mg/dL — ABNORMAL HIGH (ref 70–99)

## 2013-01-29 LAB — CBC
HCT: 24.2 % — ABNORMAL LOW (ref 36.0–46.0)
Hemoglobin: 8.1 g/dL — ABNORMAL LOW (ref 12.0–15.0)
MCV: 82.6 fL (ref 78.0–100.0)
Platelets: 123 10*3/uL — ABNORMAL LOW (ref 150–400)
RBC: 2.93 MIL/uL — ABNORMAL LOW (ref 3.87–5.11)
WBC: 7.2 10*3/uL (ref 4.0–10.5)

## 2013-01-29 LAB — BASIC METABOLIC PANEL
CO2: 27 mEq/L (ref 19–32)
Chloride: 100 mEq/L (ref 96–112)
Potassium: 3.9 mEq/L (ref 3.5–5.1)
Sodium: 136 mEq/L (ref 135–145)

## 2013-01-29 MED ORDER — POTASSIUM CHLORIDE CRYS ER 20 MEQ PO TBCR
40.0000 meq | EXTENDED_RELEASE_TABLET | Freq: Once | ORAL | Status: DC
  Administered 2013-01-29: 40 meq via ORAL

## 2013-01-29 MED ORDER — METOPROLOL TARTRATE 12.5 MG HALF TABLET
12.5000 mg | ORAL_TABLET | Freq: Two times a day (BID) | ORAL | Status: DC
  Administered 2013-01-29 – 2013-02-01 (×5): 12.5 mg via ORAL
  Filled 2013-01-29 (×8): qty 1

## 2013-01-29 MED ORDER — FERROUS GLUCONATE 324 (38 FE) MG PO TABS
324.0000 mg | ORAL_TABLET | Freq: Two times a day (BID) | ORAL | Status: DC
  Administered 2013-01-29 – 2013-02-01 (×6): 324 mg via ORAL
  Filled 2013-01-29 (×8): qty 1

## 2013-01-29 MED ORDER — FUROSEMIDE 40 MG PO TABS
40.0000 mg | ORAL_TABLET | Freq: Every day | ORAL | Status: DC
  Administered 2013-01-30 – 2013-02-01 (×3): 40 mg via ORAL
  Filled 2013-01-29 (×3): qty 1

## 2013-01-29 MED ORDER — MOVING RIGHT ALONG BOOK
Freq: Once | Status: AC
  Administered 2013-01-29: 12:00:00
  Filled 2013-01-29: qty 1

## 2013-01-29 MED ORDER — DOCUSATE SODIUM 100 MG PO CAPS
200.0000 mg | ORAL_CAPSULE | Freq: Every day | ORAL | Status: DC
  Administered 2013-01-30 – 2013-02-01 (×3): 200 mg via ORAL
  Filled 2013-01-29 (×3): qty 2

## 2013-01-29 MED ORDER — ONDANSETRON HCL 4 MG/2ML IJ SOLN: 4.0000 mg | Freq: Four times a day (QID) | INTRAMUSCULAR | Status: DC | PRN

## 2013-01-29 MED ORDER — OXYCODONE HCL 5 MG PO TABS
5.0000 mg | ORAL_TABLET | ORAL | Status: DC | PRN
  Administered 2013-01-29: 5 mg via ORAL
  Administered 2013-01-29 – 2013-01-30 (×2): 10 mg via ORAL
  Filled 2013-01-29 (×4): qty 2

## 2013-01-29 MED ORDER — FAMOTIDINE 20 MG PO TABS
20.0000 mg | ORAL_TABLET | Freq: Two times a day (BID) | ORAL | Status: DC
  Administered 2013-01-29 – 2013-02-01 (×6): 20 mg via ORAL
  Filled 2013-01-29 (×7): qty 1

## 2013-01-29 MED ORDER — ONDANSETRON HCL 4 MG PO TABS: 4.0000 mg | ORAL_TABLET | Freq: Four times a day (QID) | ORAL | Status: DC | PRN

## 2013-01-29 MED ORDER — FUROSEMIDE 10 MG/ML IJ SOLN
40.0000 mg | Freq: Once | INTRAMUSCULAR | Status: DC
  Administered 2013-01-29: 40 mg via INTRAVENOUS

## 2013-01-29 MED ORDER — SODIUM CHLORIDE 0.9 % IV SOLN: 250.0000 mL | INTRAVENOUS | Status: DC | PRN

## 2013-01-29 MED ORDER — TRAMADOL HCL 50 MG PO TABS: 50.0000 mg | ORAL_TABLET | ORAL | Status: DC | PRN

## 2013-01-29 MED ORDER — SODIUM CHLORIDE 0.9 % IJ SOLN: 3.0000 mL | INTRAMUSCULAR | Status: DC | PRN

## 2013-01-29 MED ORDER — ASPIRIN EC 325 MG PO TBEC
325.0000 mg | DELAYED_RELEASE_TABLET | Freq: Every day | ORAL | Status: DC
  Administered 2013-01-29 – 2013-02-01 (×4): 325 mg via ORAL
  Filled 2013-01-29 (×4): qty 1

## 2013-01-29 MED ORDER — POTASSIUM CHLORIDE CRYS ER 20 MEQ PO TBCR
20.0000 meq | EXTENDED_RELEASE_TABLET | Freq: Two times a day (BID) | ORAL | Status: DC
  Administered 2013-01-29 – 2013-02-01 (×6): 20 meq via ORAL
  Filled 2013-01-29 (×4): qty 1
  Filled 2013-01-29: qty 2
  Filled 2013-01-29 (×3): qty 1

## 2013-01-29 MED ORDER — BISACODYL 10 MG RE SUPP: 10.0000 mg | Freq: Every day | RECTAL | Status: DC | PRN

## 2013-01-29 MED ORDER — BISACODYL 5 MG PO TBEC: 10.0000 mg | DELAYED_RELEASE_TABLET | Freq: Every day | ORAL | Status: DC | PRN

## 2013-01-29 MED ORDER — SODIUM CHLORIDE 0.9 % IJ SOLN
3.0000 mL | Freq: Two times a day (BID) | INTRAMUSCULAR | Status: DC
  Administered 2013-01-29 – 2013-01-31 (×5): 3 mL via INTRAVENOUS

## 2013-01-29 MED ORDER — ACETAMINOPHEN 325 MG PO TABS: 650.0000 mg | ORAL_TABLET | Freq: Four times a day (QID) | ORAL | Status: DC | PRN

## 2013-01-29 NOTE — Progress Notes (Signed)
2 Days Post-Op Procedure(s) (LRB): CORONARY ARTERY BYPASS GRAFTING times two using Right Greater Saphenous Vein Graft Harvsted Endoscopically and Left Internal Mammary Artery (N/A) INTRAOPERATIVE TRANSESOPHAGEAL ECHOCARDIOGRAM (N/A) Subjective: No complaints  Objective: Vital signs in last 24 hours: Temp:  [97.9 F (36.6 C)-98.4 F (36.9 C)] 98.3 F (36.8 C) (09/06 0713) Pulse Rate:  [74-86] 82 (09/06 0800) Cardiac Rhythm:  [-] Normal sinus rhythm (09/06 0838) Resp:  [12-26] 13 (09/06 0800) BP: (93-120)/(57-87) 106/64 mmHg (09/06 0800) SpO2:  [96 %-100 %] 100 % (09/06 0800) Arterial Line BP: (92-117)/(40-55) 117/52 mmHg (09/05 1300) Weight:  [58.7 kg (129 lb 6.6 oz)] 58.7 kg (129 lb 6.6 oz) (09/06 0500)  Hemodynamic parameters for last 24 hours:    Intake/Output from previous day: 09/05 0701 - 09/06 0700 In: 1130 [P.O.:480; I.V.:600; IV Piggyback:50] Out: 2440 [Urine:2410; Chest Tube:30] Intake/Output this shift: Total I/O In: 20 [I.V.:20] Out: 40 [Urine:40]  General appearance: alert and cooperative Heart: regular rate and rhythm, S1, S2 normal, no murmur, click, rub or gallop Lungs: clear to auscultation bilaterally Extremities: extremities normal, atraumatic, no cyanosis or edema Wound: dressing dry  Lab Results:  Recent Labs  01/28/13 1640 01/29/13 0425  WBC 7.5 7.2  HGB 8.2* 8.1*  HCT 25.0* 24.2*  PLT 133* 123*   BMET:  Recent Labs  01/28/13 0400 01/28/13 1639 01/28/13 1640 01/29/13 0425  NA 138 138  --  136  K 4.4 4.0  --  3.9  CL 103 98  --  100  CO2 27  --   --  27  GLUCOSE 146* 123*  --  122*  BUN 14 13  --  14  CREATININE 0.75 0.90 0.79 0.76  CALCIUM 9.5  --   --  9.3    PT/INR:  Recent Labs  01/27/13 1300  LABPROT 15.0  INR 1.21   ABG    Component Value Date/Time   PHART 7.353 01/28/2013 0005   HCO3 28.8* 01/28/2013 0005   TCO2 28 01/28/2013 1639   ACIDBASEDEF 2.0 01/27/2013 1345   O2SAT 98.0 01/28/2013 0005   CBG (last 3)   Recent  Labs  01/29/13 0001 01/29/13 0353 01/29/13 0711  GLUCAP 109* 109* 122*   *RADIOLOGY REPORT*  Clinical Data: Postop CABG  PORTABLE CHEST - 1 VIEW  Comparison: 01/28/2013; 01/26/2013; chest CT - 01/19/2013  Findings:  Grossly unchanged enlarged cardiac silhouette and mediastinal  contours with persistent deviation of the tracheal air column to  the right secondary to known fusiform aneurysmal dilatation of the  thoracic aorta. Post median sternotomy. Interval removal of left-  sided chest tube, mediastinal drain and PA catheter with remaining  vascular sheath tip projecting over the superior aspect of the SVC.  The pulmonary vasculature remains indistinct with cephalization of  flow. Grossly unchanged small bilateral effusions with associated  bilateral mid and lower lung heterogeneous airspace opacities.  Grossly unchanged bones.  IMPRESSION:  1. Interval removal of support apparatus without evidence of  pneumothorax.  2. Persistent findings of pulmonary edema, small bilateral  effusions and associated bibasilar atelectasis.  Original Report Authenticated By: Tacey Ruiz, MD        Assessment/Plan: S/P Procedure(s) (LRB): CORONARY ARTERY BYPASS GRAFTING times two using Right Greater Saphenous Vein Graft Harvsted Endoscopically and Left Internal Mammary Artery (N/A) INTRAOPERATIVE TRANSESOPHAGEAL ECHOCARDIOGRAM (N/A) Low EF 20% preop. Will need ACE I when BP allows. Mobilize Diuresis Plan for transfer to step-down: see transfer orders   LOS: 11 days    Versia Mignogna  K 01/29/2013

## 2013-01-29 NOTE — Progress Notes (Signed)
2 Day Post-Op Procedure(s) (LRB):  CORONARY ARTERY BYPASS GRAFTING times two using Right Greater Saphenous Vein Graft Harvsted Endoscopically and Left Internal Mammary Artery (N/A)  INTRAOPERATIVE TRANSESOPHAGEAL ECHOCARDIOGRAM     Subjective: Up in chair, Has walked around unit today  Objective: Vital signs in last 24 hours: Temp:  [97.9 F (36.6 C)-98.4 F (36.9 C)] 98.3 F (36.8 C) (09/06 0713) Pulse Rate:  [74-90] 81 (09/06 0700) Resp:  [12-26] 20 (09/06 0700) BP: (93-120)/(57-87) 104/60 mmHg (09/06 0700) SpO2:  [96 %-100 %] 100 % (09/06 0700) Arterial Line BP: (92-120)/(40-59) 117/52 mmHg (09/05 1300) Weight:  [129 lb 6.6 oz (58.7 kg)] 129 lb 6.6 oz (58.7 kg) (09/06 0500) Weight change: 9 lb 11.2 oz (4.4 kg) Last BM Date: 01/26/13 Intake/Output from previous day: -1314 wt decreasing but 10 lbs above pre op wt. 09/05 0701 - 09/06 0700 In: 1130 [P.O.:480; I.V.:600; IV Piggyback:50] Out: 2440 [Urine:2410; Chest Tube:30] Intake/Output this shift:    PE: per Dr. Herbie Baltimore    Lab Results:  Recent Labs  01/28/13 1640 01/29/13 0425  WBC 7.5 7.2  HGB 8.2* 8.1*  HCT 25.0* 24.2*  PLT 133* 123*   BMET  Recent Labs  01/28/13 0400 01/28/13 1639 01/28/13 1640 01/29/13 0425  NA 138 138  --  136  K 4.4 4.0  --  3.9  CL 103 98  --  100  CO2 27  --   --  27  GLUCOSE 146* 123*  --  122*  BUN 14 13  --  14  CREATININE 0.75 0.90 0.79 0.76  CALCIUM 9.5  --   --  9.3   No results found for this basename: TROPONINI, CK, MB,  in the last 72 hours  Lab Results  Component Value Date   CHOL 169 01/19/2013   HDL 55 01/19/2013   LDLCALC 103* 01/19/2013   TRIG 57 01/19/2013   CHOLHDL 3.1 01/19/2013   Lab Results  Component Value Date   HGBA1C 5.8* 01/26/2013     Lab Results  Component Value Date   TSH 0.823 01/18/2013    HStudies/Results: Dg Chest Portable 1 View In Am  01/28/2013   *RADIOLOGY REPORT*  Clinical Data: Postop  PORTABLE CHEST - 1 VIEW  Comparison:  01/27/2013  Findings: Endotracheal tube removed.  NG removed.  Left chest tube remains in place.  Swan-Ganz catheter in the left lower lobe pulmonary artery unchanged.  Mediastinal and pericardial drains in place.  Negative for pneumothorax.  Increase in bibasilar atelectasis and small effusion.  Possible mild edema.  IMPRESSION: Hypoventilation with increase in bibasilar atelectasis and effusion.  Question mild edema.   Original Report Authenticated By: Janeece Riggers, M.D.   Dg Chest Portable 1 View  01/27/2013   CLINICAL DATA:  Coronary artery disease. Postop from CABG.  EXAM: PORTABLE CHEST - 1 VIEW  COMPARISON:  01/26/2013  FINDINGS: The patient has undergone interval CABG. Swan-Ganz catheter tip overlies the proximal left lower lobe pulmonary artery. Endotracheal tube, mediastinal drains, nasogastric tube and left chest tube are seen in appropriate position. No evidence of pneumothorax.  Moderate cardiomegaly is stable. There is mild diffuse interstitial edema. No evidence of pulmonary consolidation or pleural effusion.  IMPRESSION: Postoperative chest with mild diffuse pulmonary edema. Stable cardiomegaly. No pneumothorax visualized.   Electronically Signed   By: Myles Rosenthal   On: 01/27/2013 14:11    Medications: I have reviewed the patient's current medications. Scheduled Meds: . acetaminophen  1,000 mg Oral Q6H  Or  . acetaminophen (TYLENOL) oral liquid 160 mg/5 mL  1,000 mg Per Tube Q6H  . aspirin EC  325 mg Oral Daily   Or  . aspirin  324 mg Per Tube Daily  . atorvastatin  10 mg Oral q1800  . bisacodyl  10 mg Oral Daily   Or  . bisacodyl  10 mg Rectal Daily  . cefUROXime (ZINACEF)  IV  1.5 g Intravenous Q12H  . docusate sodium  200 mg Oral Daily  . enoxaparin (LOVENOX) injection  40 mg Subcutaneous QHS  . insulin aspart  0-24 Units Subcutaneous Q4H  . metoprolol tartrate  12.5 mg Oral BID   Or  . metoprolol tartrate  12.5 mg Per Tube BID  . pantoprazole  40 mg Oral Daily  . sodium  chloride  3 mL Intravenous Q12H   Continuous Infusions: . sodium chloride 20 mL/hr at 01/27/13 1349  . sodium chloride Stopped (01/27/13 1800)  . sodium chloride    . dexmedetomidine Stopped (01/27/13 1745)  . DOPamine Stopped (01/28/13 0700)  . lactated ringers 50 mL/hr at 01/27/13 1352  . nitroGLYCERIN Stopped (01/27/13 1330)  . phenylephrine (NEO-SYNEPHRINE) Adult infusion Stopped (01/28/13 0500)   PRN Meds:.metoprolol, morphine injection, ondansetron (ZOFRAN) IV, oxyCODONE, sodium chloride  Assessment/Plan: Principal Problem:   NSTEMI (non-ST elevated myocardial infarction) Active Problems:   LBBB (left bundle branch block)   Dyspnea, acute   Tobacco abuse   CAD, diffuse by cath 01/20/13- for CABG   Aneurysmal dilatation both ascending and descending thoracic aorta wth mural thrombus   Hypokalemia   Acute combined systolic and diastolic congestive heart failure, NYHA class 3   Cardiomyopathy, ischemic 20-25% with moderate MR   UTI (urinary tract infection)-  E.Coli, TREATED with Cipro   Dyslipidemia   S/P CABG x 2 - LIMA-LAD, SVG-OM   Postoperative anemia due to acute blood loss  PLAN:  Progressing well, plan for life vest at discharge.  VS stable.  LOS: 11 days   Time spent with pt. :10 minutes. [facility] R  Nurse Practitioner Certified Pager [phone] 01/29/2013, 8:36 AM  BP & HR stable -- unable to initiate BB or ACE-I/ARB at this point due to BPs just barely over 100 mmHg.  Would consider low dose ACE-I in the next few days. Agree with plan for LifeVest prior to d/c - order signed. EF 20-25% pre-op --> needs 3 months f/u Echo. Tobacco abuse counseling given - 5 min. Anticipate Foley out today - recent UTI treated.   Marykay Lex, M.D., M.S. [facility] [address] [place], [place]  [address]  [phone] Pager # [phone] 01/29/2013 9:07 AM

## 2013-01-30 NOTE — Progress Notes (Signed)
Ambulated 200 ft in hallway using wheelchair. Ambulated on 3L nasal cannula; stopped x2 d/t SOB. Returned to bed. Call bell near. Will continue to monitor.Kathryn Dixon

## 2013-01-30 NOTE — Progress Notes (Addendum)
[address]       [place] [address]             [phone]      3 Days Post-Op  Procedure(s) (LRB): CORONARY ARTERY BYPASS GRAFTING times two using Right Greater Saphenous Vein Graft Harvsted Endoscopically and Left Internal Mammary Artery (N/A) INTRAOPERATIVE TRANSESOPHAGEAL ECHOCARDIOGRAM (N/A) Subjective: Feels ok, still fairly weak  Objective  Telemetry sinus rhythm  Temp:  [97.9 F (36.6 C)-99.2 F (37.3 C)] 99 F (37.2 C) (09/07 0520) Pulse Rate:  [83-108] 88 (09/07 0520) Resp:  [11-26] 16 (09/07 0520) BP: (92-150)/(54-81) 98/67 mmHg (09/07 0520) SpO2:  [94 %-100 %] 100 % (09/07 0520) Weight:  [128 lb 1.4 oz (58.1 kg)] 128 lb 1.4 oz (58.1 kg) (09/07 0520)   Intake/Output Summary (Last 24 hours) at 01/30/13 0808 Last data filed at 01/30/13 0500  Gross per 24 hour  Intake    240 ml  Output   2651 ml  Net  -2411 ml       General appearance: alert, cooperative and no distress Heart: regular rate and rhythm Lungs: sl coarse throughout Abdomen: benign exam Extremities: no edema Wound: incisions healing well  Lab Results:  Recent Labs  01/28/13 0400 01/28/13 1639 01/28/13 1640 01/29/13 0425  NA 138 138  --  136  K 4.4 4.0  --  3.9  CL 103 98  --  100  CO2 27  --   --  27  GLUCOSE 146* 123*  --  122*  BUN 14 13  --  14  CREATININE 0.75 0.90 0.79 0.76  CALCIUM 9.5  --   --  9.3  MG 2.6*  --  2.3  --    No results found for this basename: AST, ALT, ALKPHOS, BILITOT, PROT, ALBUMIN,  in the last 72 hours No results found for this basename: LIPASE, AMYLASE,  in the last 72 hours  Recent Labs  01/28/13 1640 01/29/13 0425  WBC 7.5 7.2  HGB 8.2* 8.1*  HCT 25.0* 24.2*  MCV 83.9 82.6  PLT 133* 123*   No results found for this basename: CKTOTAL, CKMB, TROPONINI,  in the last 72 hours No components found with this basename: POCBNP,  No results found for this basename: DDIMER,  in the last 72 hours No results found for this  basename: HGBA1C,  in the last 72 hours No results found for this basename: CHOL, HDL, LDLCALC, TRIG, CHOLHDL,  in the last 72 hours No results found for this basename: TSH, T4TOTAL, FREET3, T3FREE, THYROIDAB,  in the last 72 hours No results found for this basename: VITAMINB12, FOLATE, FERRITIN, TIBC, IRON, RETICCTPCT,  in the last 72 hours  Medications: Scheduled . aspirin EC  325 mg Oral Daily  . atorvastatin  10 mg Oral q1800  . docusate sodium  200 mg Oral Daily  . enoxaparin (LOVENOX) injection  40 mg Subcutaneous QHS  . famotidine  20 mg Oral BID  . ferrous gluconate  324 mg Oral BID WC  . furosemide  40 mg Oral Daily  . metoprolol tartrate  12.5 mg Oral BID  . potassium chloride  20 mEq Oral BID  . sodium chloride  3 mL Intravenous Q12H     Radiology/Studies:  Dg Chest Port 1 View  01/29/2013   *RADIOLOGY REPORT*  Clinical Data: Postop CABG  PORTABLE CHEST - 1 VIEW  Comparison: 01/28/2013; 01/26/2013; chest CT - 01/19/2013  Findings:  Grossly unchanged enlarged cardiac silhouette  and mediastinal contours with persistent deviation of the tracheal air column to the right secondary to known fusiform aneurysmal dilatation of the thoracic aorta.  Post median sternotomy.  Interval removal of left- sided chest tube, mediastinal drain and PA catheter with remaining vascular sheath tip projecting over the superior aspect of the SVC. The pulmonary vasculature remains indistinct with cephalization of flow.  Grossly unchanged small bilateral effusions with associated bilateral mid and lower lung heterogeneous airspace opacities. Grossly unchanged bones.  IMPRESSION: 1.  Interval removal of support apparatus without evidence of pneumothorax. 2.  Persistent findings of pulmonary edema, small bilateral effusions and associated bibasilar atelectasis.   Original Report Authenticated By: Tacey Ruiz, MD    INR: Will add last result for INR, ABG once components are confirmed Will add last 4 CBG  results once components are confirmed  Assessment/Plan: S/P Procedure(s) (LRB): CORONARY ARTERY BYPASS GRAFTING times two using Right Greater Saphenous Vein Graft Harvsted Endoscopically and Left Internal Mammary Artery (N/A) INTRAOPERATIVE TRANSESOPHAGEAL ECHOCARDIOGRAM (N/A)  1 overall steady progress 2 one reading of sBP in 150's otherwise runs fairly low- no ACEI yet 3 no new labs- repeat in am 4 sugars controlled 5 cont gentle diuresis/pulm toilet/rehab     LOS: 12 days    Dixon,Kathryn E 9/7/20148:08 AM   Chart reviewed, patient examined, agree with above.

## 2013-01-30 NOTE — Progress Notes (Signed)
Ambulated 250 ft in hallway on 3L nasal cannula. Stopped x2 d/t sob w/ exertion. Returned to bedside chair in room. Call bell near.Kathryn Dixon

## 2013-01-31 LAB — BASIC METABOLIC PANEL
Calcium: 9.8 mg/dL (ref 8.4–10.5)
GFR calc Af Amer: >90 mL/min (ref >90)
GFR calc non Af Amer: 84 mL/min — ABNORMAL LOW (ref >90)
Potassium: 4.3 mEq/L (ref 3.5–5.1)
Sodium: 136 mEq/L (ref 135–145)

## 2013-01-31 LAB — PRO B NATRIURETIC PEPTIDE: Pro B Natriuretic peptide (BNP): 3540 pg/mL — ABNORMAL HIGH (ref 0–125)

## 2013-01-31 LAB — CBC
MCH: 27.5 pg (ref 26.0–34.0)
Platelets: 152 10*3/uL (ref 150–400)
RBC: 2.91 MIL/uL — ABNORMAL LOW (ref 3.87–5.11)
WBC: 7.7 10*3/uL (ref 4.0–10.5)

## 2013-01-31 LAB — GLUCOSE, CAPILLARY: Glucose-Capillary: 130 mg/dL — ABNORMAL HIGH (ref 70–99)

## 2013-01-31 NOTE — Discharge Summary (Signed)
Physician Discharge Summary       [address] [place]       [name] [address]             [phone]    Patient ID: Kathryn Dixon MRN: [id number] DOB/AGE: 1946-02-19 67 y.o.  Admit date: 01/18/2013 Discharge date: 02/01/2013   Admission Diagnoses: 1.NSTEMI 2.Historyof CAD (s/p MI) 3.History of hypertension 4.History of tobacco abuse  Discharge Diagnoses:  1.NSTEMI 2.Historyof CAD (s/p MI) 3.History of hypertension 4.History of tobacco abuse 5.ABL anemia   Procedure (s):  1.Cardiac Catheterization done on 01/20/2013 by Dr. Herbie Baltimore: FINDINGS:  Hemodynamics:  Findings:   SaO2%  Pressures mmHg  Mean P  mmHg  EDP  mmHg   Right Atrium   13/7   7   Right Ventricle   63/9   9   Pulmonary Artery  36% (RA)  60/33  48    PCWP   35/40  32-36    Transpulmonary gradient    12-16    Central Aortic  78% (RA)  152/96  101    Left Ventricle   151/25   36    following 40 mg IV Lasix  147/20   27    Cardiac Output:   Cardiac Index:    Fick  3.4   2.16    Thermodilution  3.0   1.9    Left Ventriculography: Not done due to possible LV apical thrombus - by Echo EF 20-25%  Coronary Anatomy:  Left Main: Large caliber vessel with a tortuous course that has a distal ~70-80% stenosis followed by an extremely aneurysmal T-point where the LAD and Circumflex arise.  LAD: Large caliber, diffusely calcified and ectatic vessel with extremely tortuous/corkscrew proximal course. It gives rise to small first and second diagonal branches. There are extensive septal collaterals which fill the Right Posterior Descending Artery (RPDA).  There is a diffuse 40% stenosis followed by a focal roughly 70% focal stenosis at the beginning of what appears to be a healed dissection prior to a large distal third diagonal branch branch. This region is relatively long, and after 70% stenosis has minimal disease until just prior to D3 where there appears to be the other end of the healed dissection. Beyond D3  the vessel wraps around the apex providing distal collaterals to the distal RPDA.  D3: Distal, major branch that is moderate caliber cover the distal anterolateral wall. No significant disease. Left Circumflex: This is a large caliber ectatic vessel with a proximal 60% stenosis. There is extremely tortuous course where the vessel then bifurcates into a large lateral OM in the AV groove circumflex. The AV groove circumflex is proximally occluded after ectatic segment, with bridging collaterals to the distal portion that then fills the distal right posterior lateral system.  OM1: This is also a large caliber, ectatic vessel with a proximal 70% lesion. The vessel itself bifurcates into what appears to be at least 2 major marginal branches but is subtotally occluded prior to this. There is TIMI 1 flow into 2 distal branches that fill via collaterals from several small diagonal branches including D3. RCA: 100 % chronic proximal total occlusion.  RPDA: fills via L-R septal perforators  RPL Sysytem:The RPAV fills via diffusely diseased AVgroove Circumflex collaterals  2. Median Sternotomy, extracorporeal circulation, coronary artery bypass grafting x 2  Left internal mammary graft to the LAD  SVG to OM 1  And endoscopic vein harvest from the right leg by Dr. Laneta Simmers on 01/27/2013.  History of Presenting Illness: This is a Kathryn Dixon smoker with HTN and a history of coronary artery disease (s/p MI 15-20 years ago).She has not followed up since. She says she was in her usual sedentary state with no symptoms until she woke up Tuesday with some shortness of breath and just did not feel right. She tried to go back to sleep but couldn't and developed worsening dyspnea prompting her to call EMS. She had no chest pain or discomfort. She ruled in for MI with POC troponin of 0.1. ECG showed LBBB that was not new. Subsequent troponin was 1.96. Her D-dimer was 11.6 and she had a CT pulmonary  angiogram ruling out PE. It did show aneurysmal enlargement of her entire thoracic aorta. Cardiology was concerned about this and the possibility of dissection so cath was postponed and a CTA of the chest was done  showing moderate enlargement of the thoracic aorta with no dissection. A 2D echo showed severe LV dysfunction with severe LVH and an EF of 20-25%. Right and left heart cath done on 01/20/2013 shows severe pulmonary hypertension with borderline cardiac index of 2. There is 80% distal LM stenosis, 100% chronically occluded RCA with collaterals to the distal vessel and significant LCX disease.  A cardiothoracic consultation was obtained with Dr. Laneta Simmers for the consideration of coronary artery bypass grafting surgery.She has had no dyspnea or chest discomfort at the time of being seen by Dr. Laneta Simmers. Potential risks, complications, and benefits were discussed with the patient and she agreed to proceed. Pre operative duplex carotid US showed 40-Kathryn% right internal carotid artery stenosis and a 1-39% left internal carotid artery stenosis. She underwent a CABG x 2 on 01/27/2013.   Brief Hospital Course:  The patient was extubated later the evening of surgery without difficulty. She remained afebrile and hemodynamically stable. Theone Murdoch, a line, chest tubes, and foley were removed early in the post operative course. Lopressor was started and titrated accordingly. She was volume over loaded and diuresed. She was weaned off the insulin drip. She did have ABL anemia. Her H and H went as low as 8.2 and 25. She did not require a post operative transfusion.The patient's HGA1C pre op was  5.8. The patient was felt surgically stable for transfer from the ICU to [facility] for further convalescence on 01/29/2013.  She continues to progress with cardiac rehab. She was ambulating on room air. She has been tolerating a diet and has had a bowel movement. Epicardial pacing wires and chest tube sutures have been removed.  Cardiology has recommended a Life Vest at discharge, and this was arranged. The patient has remained afebrile, hemodynamically stable, and was seen on morning round evaluation on today's date. She is surgically stable for discharge on 02/01/2013.   Latest Vital Signs: Blood pressure 103/70, pulse 91, temperature 98.8 F (37.1 C), temperature source Oral, resp. rate 18, height 5\' 4"  (1.626 m), weight 56.6 kg (124 lb 12.5 oz), SpO2 98.00%.  Physical Exam: Heart: RRR  Lungs: Clear  Wound: Clean and dry  Extremities: No LE edema   Discharge Condition:Stable  Recent laboratory studies:  Lab Results  Component Value Date   WBC 7.7 01/31/2013   HGB 8.0* 01/31/2013   HCT 24.3* 01/31/2013   MCV 83.5 01/31/2013   PLT 152 01/31/2013   Lab Results  Component Value Date   NA 136 01/31/2013   K 4.3 01/31/2013   CL 99 01/31/2013   CO2 28 01/31/2013  CREATININE 0.78 01/31/2013   GLUCOSE 137* 01/31/2013      Diagnostic Studies:  Ct Angio Chest Pe W/cm &/or Wo Cm  01/18/2013   *RADIOLOGY REPORT*  Clinical Data: Elevated D-dimer, shortness of breath, chest pain, evaluate for pulmonary embolism  CT ANGIOGRAPHY CHEST  Technique:  Multidetector CT imaging of the chest using the standard protocol during bolus administration of intravenous contrast. Multiplanar reconstructed images including MIPs were obtained and reviewed to evaluate the vascular anatomy.  Contrast: OMNIPAQUE IOHEXOL 350 MG/ML SOLN  Comparison: Chest radiograph - earlier same day  Vascular Findings:  There is adequate opacification of the pulmonary arterial system of the main pulmonary artery measuring 517 HU. There is a small nonocclusive bleb within an anterior segmental branch of the left upper lobe (image 102, series 5, coronal image 40 dB).  Otherwise, there are no discrete filling defects within the pulmonary arterial tree.  The caliber of the main pulmonary artery is enlarged measuring 34 mm in diameter.  Marked cardiomegaly, in particular,  there is marked enlargement of the left ventricle.  Coronary artery calcifications.  Trace amount of presumably physiologic pericardial fluid.  While this examination was not tailored for evaluation of the thoracic aorta, there is fusiform aneurysmal dilatation of the ascending thoracic aorta measuring 41 mm in greatest oblique axial dimension (image 68, series four).  The thoracic aorta remains ectatic proximally measuring approximately 37 mm at the level of the aortic arch (image 43, series four).  There is a minimal amount of apparent crescentic mural thrombus within the cranial aspect of the descending thoracic aorta (image 55, series four).  While mid aspect of the descending thoracic aorta tapers to a near normal caliber (measuring approximately 30 mm in diameter - image 82, series four), the distal aspect of the descending thoracic aorta is tortuous and aneurysmal measuring approximately 43 mm at the level of the diaphragmatic hiatus.  There is apparent crescentic mural thrombus within the aneurysmal descending thoracic aorta (image 126, series four).  No definite periaortic stranding.  ---------------------------------------------------------  Nonvascular findings:  Small to moderate-sized bilateral pleural effusions with associated adjacent compressive atelectasis, right greater than left.  There is rather diffuse mild interlobular septal thickening and ground glass opacification suggestive of pulmonary edema.  Mild centrilobular paraseptal emphysematous change is suspected.  There is a minimal amount of debris within distal aspect of the trachea extending to the right main stem bronchus.  The central pulmonary airways remain patent.  Scattered shoddy mediastinal lymph nodes are borderline enlarged with index precarinal node conglomeration measuring 1.1 cm in short axis diameter (image 56, series 4 and index prevascular node measuring 1.1 cm.  No definite hilar or axillary lymphadenopathy.  Limited early  arterial phase evaluation of the upper abdomen demonstrates a left sided nephrolithiasis with prominent stone measuring 3.6 mm (image 132).  Incidental note of a small splenule.  No acute or aggressive osseous abnormalities.  Dystrophic calcifications within the right lobe of the thyroid.  IMPRESSION:  1.  No acute pulmonary embolism.  Tiny nonocclusive pulmonary arterial web incidentally noted within an anterior segmental branch of the left upper lobe, likely the sequela of remote/prior pulmonary embolism.  2.  Findings suggestive of pulmonary edema with small bilateral pleural effusions and associated adjacent compressive atelectasis, right greater than left.  3.  Cardiomegaly.  Coronary calcifications. Enlarged caliber of the main pulmonary artery, nonspecific though may be seen in the setting of pulmonary arterial hypertension. Further evaluation with cardiac echo may be performed if  clinically indicated.  4.  While this examination was not tailored for evaluation of the thoracic aorta, note is made of aneurysmal dilatation of the ascending thoracic aorta measuring approximately 41 mm extending through the aortic arch and proximal descending thoracic aorta. While the mid aspect of the thoracic spine tapers to a near normal caliber, the distal aspect of the descending thoracic aorta is tortuous and aneurysmal measuring 43 mm in diameter and containing mural thrombus.  Non emergent referral to the thoracic surgery and dedicated TAA CT is recommended.  5.  Incidentally noted left-sided nephrolithiasis.  7.  Dystrophic calcifications in the right lobe of the thyroid. Further evaluation with dedicated thyroid ultrasound may be performed as clinically indicated.   Original Report Authenticated By: Tacey Ruiz, MD   Dg Chest Port 1 View  01/29/2013   *RADIOLOGY REPORT*  Clinical Data: Postop CABG  PORTABLE CHEST - 1 VIEW  Comparison: 01/28/2013; 01/26/2013; chest CT - 01/19/2013  Findings:  Grossly unchanged  enlarged cardiac silhouette and mediastinal contours with persistent deviation of the tracheal air column to the right secondary to known fusiform aneurysmal dilatation of the thoracic aorta.  Post median sternotomy.  Interval removal of left- sided chest tube, mediastinal drain and PA catheter with remaining vascular sheath tip projecting over the superior aspect of the SVC. The pulmonary vasculature remains indistinct with cephalization of flow.  Grossly unchanged small bilateral effusions with associated bilateral mid and lower lung heterogeneous airspace opacities. Grossly unchanged bones.  IMPRESSION: 1.  Interval removal of support apparatus without evidence of pneumothorax. 2.  Persistent findings of pulmonary edema, small bilateral effusions and associated bibasilar atelectasis.   Original Report Authenticated By: Tacey Ruiz, MD   Ct Angio Chest Aortic Dissect W &/or W/o  01/19/2013   CLINICAL DATA:  Enlarged descending thoracic aorta on a PE protocol chest CTA obtained yesterday.  EXAM: CT ANGIOGRAPHY CHEST WITH CONTRAST  TECHNIQUE: Multidetector CT imaging of the chest was performed using the standard protocol during bolus administration of intravenous contrast. Multiplanar CT image reconstructions including MIPs were obtained to evaluate the vascular anatomy.  CONTRAST:  100 cc Omnipaque 350  COMPARISON:  Yesterday.  FINDINGS: The pulmonary arteries are better opacified than the thoracic aorta. Again demonstrated is fusiform aneurysmal dilatation of the descending thoracic aorta without dissection. There is a focal indentation in the posterior aspect of the descending thoracic aorta by the posterior aspect of the left hemidiaphragm with continued aneurysmal dilatation of the proximal abdominal aorta below the level of the diaphragm. No aortic dissection in the upper abdomen.  The ascending thoracic aorta measures 3.8 cm in maximum diameter at the level of the main pulmonary artery. The proximal  descending thoracic aorta measures 4.2 cm in maximum transverse diameter with mild concentric mural thrombus. The distal descending thoracic aorta measures 3.7 cm in maximum transverse diameter with mild concentric mural thrombus. The proximal abdominal aorta measures 4.6 cm in maximum transverse diameter with a moderate concentric mural thrombus. Slightly more inferiorly, above the level of the origin of the renal arteries, there is a greater degree of mural thrombus in the proximal abdominal aorta, with a maximum thickness of 1.7 cm.  Atheromatous arterial calcifications are noted, including dense coronary artery calcifications. Again noted is normal opacification of the pulmonary arteries with no pulmonary arterial filling defects seen.  Small to moderate-sized bilateral pleural effusions have not changed significantly. No significant change in associated bilateral lower lobe dependent atelectasis. The majority of the esophagus remains moderately  dilated with some dependent high density material. The dependent high density material was not previously present.  The heart remains enlarged. Mildly enlarged mediastinal lymph nodes are again demonstrated. These include a right paratracheal node with a short axis diameter of 2.0 cm on image number 20. An AP window lymph node has a short axis diameter of 9 mm on image number 21. Mild thoracic spine degenerative changes. Tiny bilateral renal calculi. Small right kidney. Left renal cyst. Previously noted small right lobe thyroid calcified nodules.  Review of the MIP images confirms the above findings.  IMPRESSION: 1. Stable fusiform aneurysmal dilatation of the descending thoracic aorta and proximal abdominal aorta, as described above. No dissection. 2. Stable small to moderate-sized bilateral pleural effusions and associated bilateral lower lobe atelectasis. 3. Stable mild mediastinal adenopathy. 4. Previously noted small right lobe thyroid calcified nodules. 5. Stable  moderate diffuse dilatation of the esophagus. 6. Stable tiny bilateral renal calculi, right renal atrophy and left renal cyst.   Electronically Signed   By: Gordan Payment   On: 01/19/2013 21:20      Future Appointments Provider Department Dept Phone   02/23/2013 12:00 PM Alleen Borne, MD [facility] [phone]      Discharge Medications:    Medication List         aspirin 325 MG EC tablet  Take 1 tablet (325 mg total) by mouth daily.     atorvastatin 10 MG tablet  Commonly known as:  LIPITOR  Take 1 tablet (10 mg total) by mouth daily.     ferrous gluconate 324 MG tablet  Commonly known as:  FERGON  Take 1 tablet (324 mg total) by mouth 2 (two) times daily with a meal.     furosemide 40 MG tablet  Commonly known as:  LASIX  Take 1 tablet (40 mg total) by mouth daily. X 5 days     lisinopril 2.5 MG tablet  Commonly known as:  PRINIVIL,ZESTRIL  Take 1 tablet (2.5 mg total) by mouth at bedtime.     metoprolol tartrate 12.5 mg Tabs tablet  Commonly known as:  LOPRESSOR  Take 0.5 tablets (12.5 mg total) by mouth 2 (two) times daily.     oxyCODONE 5 MG immediate release tablet  Commonly known as:  Oxy IR/ROXICODONE  Take 1-2 tablets (5-10 mg total) by mouth every 3 (three) hours as needed for pain.     potassium chloride SA 20 MEQ tablet  Commonly known as:  K-DUR,KLOR-CON  Take 1 tablet (20 mEq total) by mouth daily. X 5 days           The patient has been discharged on:   1.Beta Blocker:  Yes [  x ]                              No   [   ]                              If No, reason:  2.Ace Inhibitor/ARB: Yes [ x  ]                                     No  [    ]  If No, reason:  3.Statin:   Yes [ x  ]                  No  [   ]                  If No, reason:  4.Ecasa:  Yes  [  x ]                  No   [   ]                  If No, reason:    Follow Up  Appointments: Follow-up Information   Follow up with Alleen Borne, MD In 3 weeks. (Office will call with appointment)    Specialty:  Cardiothoracic Surgery   Contact information:   [address] [place] [address] [phone]       Follow up with Abelino Derrick, PA-C. (office will call you with an appointment to see Dr. Blanchie Dessert PA)    Specialty:  Cardiology   Contact information:   [address] [place] [address] [phone]       Signed: Doree Fudge MPA-C 01/31/2013, 1:50 PM

## 2013-01-31 NOTE — Progress Notes (Signed)
Subjective:  Up with PT  Objective:  Vital Signs in the last 24 hours: Temp:  [98.3 F (36.8 C)-98.8 F (37.1 C)] 98.8 F (37.1 C) (09/08 0319) Pulse Rate:  [86-91] 91 (09/08 0319) Resp:  [16-18] 18 (09/08 0319) BP: (102-110)/(67-75) 103/70 mmHg (09/08 0319) SpO2:  [98 %-100 %] 98 % (09/08 0319) Weight:  [124 lb 12.5 oz (56.6 kg)] 124 lb 12.5 oz (56.6 kg) (09/08 0319)  Intake/Output from previous day:  Intake/Output Summary (Last 24 hours) at 01/31/13 0855 Last data filed at 01/30/13 2307  Gross per 24 hour  Intake    480 ml  Output   1350 ml  Net   -870 ml    Physical Exam: General appearance: alert, cooperative and no distress Lungs: clear to auscultation bilaterally Heart: regular rate and rhythm   Rate: 90  Rhythm: normal sinus rhythm  Lab Results:  Recent Labs  01/29/13 0425 01/31/13 0532  WBC 7.2 7.7  HGB 8.1* 8.0*  PLT 123* 152    Recent Labs  01/29/13 0425 01/31/13 0532  NA 136 136  K 3.9 4.3  CL 100 99  CO2 27 28  GLUCOSE 122* 137*  BUN 14 17  CREATININE 0.76 0.78   No results found for this basename: TROPONINI, CK, MB,  in the last 72 hours No results found for this basename: INR,  in the last 72 hours  Imaging: Imaging results have been reviewed  Cardiac Studies:  Assessment/Plan:   Principal Problem:   NSTEMI (non-ST elevated myocardial infarction) Active Problems:   Dyspnea, acute   CAD, diffuse by cath 01/20/13- for CABG   Acute combined systolic and diastolic congestive heart failure, NYHA class 3   Aneurysmal dilatation both ascending and descending thoracic aorta wth mural thrombus   Cardiomyopathy, ischemic 20-25% with moderate MR   LBBB (left bundle branch block)   Tobacco abuse   Hypokalemia   UTI (urinary tract infection)-  E.Coli, TREATED with Cipro   Dyslipidemia   S/P CABG x 2 - LIMA-LAD, SVG-OM, 01/27/13   Postoperative anemia due to acute blood loss    PLAN: Not on ACE secondary to low B/P. LifeVest at  discharge.   Corine Shelter PA-C Beeper [phone] 01/31/2013, 8:55 AM   I have seen and examined the patient along with Corine Shelter PA-C.  I have reviewed the chart, notes and new data.  I agree with PA's note.  Key new complaints: walking with PT, tires quickly Key examination changes: no overt CHF, negative fluid balance, still 5 lb above preop weight Key new findings / data: Hgb 8  PLAN: Agree with plan for LifeVest, reevaluate EF in 3 months  Thurmon Fair, MD, [facility] [phone] 01/31/2013, 9:52 PM 2

## 2013-01-31 NOTE — Progress Notes (Addendum)
       [address]       [place] [address]             [phone]          4 Days Post-Op Procedure(s) (LRB): CORONARY ARTERY BYPASS GRAFTING times two using Right Greater Saphenous Vein Graft [place] Endoscopically and Left Internal Mammary Artery (N/A) INTRAOPERATIVE TRANSESOPHAGEAL ECHOCARDIOGRAM (N/A)  Subjective: Comfortable, no complaints.   Objective: Vital signs in last 24 hours: Patient Vitals for the past 24 hrs:  BP Temp Temp src Pulse Resp SpO2 Weight  01/31/13 0319 103/70 mmHg 98.8 F (37.1 C) Oral 91 18 98 % 124 lb 12.5 oz (56.6 kg)  01/30/13 2042 110/75 mmHg 98.7 F (37.1 C) Oral 91 18 100 % -  01/30/13 1429 102/67 mmHg 98.3 F (36.8 C) Oral 86 16 99 % -  01/30/13 1040 104/74 mmHg - - 87 - - -   Current Weight  01/31/13 124 lb 12.5 oz (56.6 kg)   PRE-OPERATIVE WEIGHT: 54 kg   Intake/Output from previous day: 09/07 0701 - 09/08 0700 In: 600 [P.O.:600] Out: 1350 [Urine:1350]    PHYSICAL EXAM:  Heart: RRR Lungs: Clear Wound: Clean and dry Extremities: No LE edema    Lab Results: CBC: Recent Labs  01/29/13 0425 01/31/13 0532  WBC 7.2 7.7  HGB 8.1* 8.0*  HCT 24.2* 24.3*  PLT 123* 152   BMET:  Recent Labs  01/29/13 0425 01/31/13 0532  NA 136 136  K 3.9 4.3  CL 100 99  CO2 27 28  GLUCOSE 122* 137*  BUN 14 17  CREATININE 0.76 0.78  CALCIUM 9.3 9.8    PT/INR: No results found for this basename: LABPROT, INR,  in the last 72 hours    Assessment/Plan: S/P Procedure(s) (LRB): CORONARY ARTERY BYPASS GRAFTING times two using Right Greater Saphenous Vein Graft [place] Endoscopically and Left Internal Mammary Artery (N/A) INTRAOPERATIVE TRANSESOPHAGEAL ECHOCARDIOGRAM (N/A) CV- SR, BPs low normal.  Continue beta blocker. Vol overload- diurese. Expected postop blood loss anemia- stable, continue Fe. CRPI/ambulation, pulm toilet, wean O2, Possibly home in 1-2 days with son and family if she continues to  progress.   LOS: 13 days    COLLINS,GINA H 01/31/2013   Chart reviewed, patient examined, agree with above. Cardiology feels she will need a Life Vest due to poor EF. I don't know if she will agree to that.

## 2013-01-31 NOTE — Progress Notes (Signed)
CARDIAC REHAB PHASE I   PRE:  Rate/Rhythm: 79 SR    BP: sitting 92/60    SaO2: 92 RA  MODE:  Ambulation: 350 ft   POST:  Rate/Rhythm: 96     BP: sitting 136/80     SaO2: 92 RA  Pt fairly steady with RW and RA. No O2 needed. Legs fatigued increasingly toward end of walk requiring several rest stops. This was problem PTA as well. Pt to recliner and requested pulsating stockings be applied. Will f/u. Has RW at home.  [id number]   Elissa Lovett Vina CES, ACSM 01/31/2013 11:31 AM

## 2013-01-31 NOTE — Progress Notes (Signed)
EPW x 2 and CTS x 3 DC'd per MD order and protocol.  Wire ends intact, CT sites approximated, no bleeding or ectopy noted, pt tolerated well.  Sites covered with steris and betadine, pt instructed to remain in bed x 1 hr, frequent vitals initiated.  Will continue to monitor closely. Ave Filter

## 2013-02-01 LAB — GLUCOSE, CAPILLARY
Glucose-Capillary: 99 mg/dL (ref 70–99)
Glucose-Capillary: 106 mg/dL — ABNORMAL HIGH (ref 70–99)

## 2013-02-01 MED ORDER — LISINOPRIL 2.5 MG PO TABS: 2.5000 mg | ORAL_TABLET | Freq: Every day | ORAL | Status: DC

## 2013-02-01 MED ORDER — ASPIRIN 325 MG PO TBEC: 325.0000 mg | DELAYED_RELEASE_TABLET | Freq: Every day | ORAL | Status: DC

## 2013-02-01 MED ORDER — FUROSEMIDE 40 MG PO TABS: 40.0000 mg | ORAL_TABLET | Freq: Every day | ORAL | Status: DC

## 2013-02-01 MED ORDER — POTASSIUM CHLORIDE CRYS ER 20 MEQ PO TBCR: 20.0000 meq | EXTENDED_RELEASE_TABLET | Freq: Every day | ORAL | Status: DC

## 2013-02-01 MED ORDER — METOPROLOL TARTRATE 12.5 MG HALF TABLET: 12.5000 mg | ORAL_TABLET | Freq: Two times a day (BID) | ORAL | Status: DC

## 2013-02-01 MED ORDER — OXYCODONE HCL 5 MG PO TABS: 5.0000 mg | ORAL_TABLET | ORAL | Status: DC | PRN

## 2013-02-01 MED ORDER — LISINOPRIL 2.5 MG PO TABS
2.5000 mg | ORAL_TABLET | Freq: Every day | ORAL | Status: DC
  Filled 2013-02-01: qty 1

## 2013-02-01 MED ORDER — ATORVASTATIN CALCIUM 10 MG PO TABS: 10.0000 mg | ORAL_TABLET | Freq: Every day | ORAL | Status: DC

## 2013-02-01 MED ORDER — FERROUS GLUCONATE 324 (38 FE) MG PO TABS: 324.0000 mg | ORAL_TABLET | Freq: Two times a day (BID) | ORAL | Status: DC

## 2013-02-01 NOTE — Care Management Note (Signed)
    Page 1 of 2   02/01/2013     2:31:02 PM   CARE MANAGEMENT NOTE 02/01/2013  Patient:  Kathryn Dixon, Kathryn Dixon   Account Number:  [id number]  Date Initiated:  01/28/2013  Documentation initiated by:  Jerrard Bradburn  Subjective/Objective Assessment:   PT S/P CABG X2 ON 01/27/13.  PTA, PT FAIRLY INDEPENDENT, LIVES WITH FAMILY MEMBERS.     Action/Plan:   WILL FOLLOW FOR HOME NEEDS AS PT PROGRESSES.   Anticipated DC Date:  02/01/2013   Anticipated DC Plan:  HOME W HOME HEALTH SERVICES      DC Planning Services  CM consult      [facility] Choice  HOME HEALTH   Choice offered to / List presented to:  C-1 Patient        HH arranged  HH-1 RN      [facility] agency  [facility]   Status of service:  Completed, signed off Medicare Important Message given?   (If response is "NO", the following Medicare IM given date fields will be blank) Date Medicare IM given:   Date Additional Medicare IM given:    Discharge Disposition:  HOME W HOME HEALTH SERVICES  Per UR Regulation:  Reviewed for med. necessity/level of care/duration of stay  If discussed at Long Length of Stay Meetings, dates discussed:   01/25/2013  01/27/2013  02/01/2013    Comments:  02/01/13 Adelfa Lozito,RN,BSN [phone] PT HAD LIFEVEST FITTED LAST PM.  FOR DC HOME TODAY WITH FAMILY.  PT AGREEABLE FOR HHRN FOLLOW UP AT DISCHARGE. REFERRAL TO [facility], PER PT CHOICE.  START OF CARE 24-48H POST DC DATE.  PT HAS RW AT HOME.

## 2013-02-01 NOTE — Progress Notes (Signed)
Discussed ed with pt including sternal precautions, HF, daily wts, low sodium diet, smoking cessation, and lifevest. Voiced understanding and has materials. Set up lifevest video for her to watch. Sts she is planning to quit smoking. Interested in [facility] [place] if her children can provide transport. Will send referral. Pt sts she does not have scales at home.  [id number] Ethelda Chick CES, ACSM. 12:00 PM 02/01/2013

## 2013-02-01 NOTE — Progress Notes (Addendum)
       [address]       [place] [address]             [phone]          5 Days Post-Op Procedure(s) (LRB): CORONARY ARTERY BYPASS GRAFTING times two using Right Greater Saphenous Vein Graft Harvsted Endoscopically and Left Internal Mammary Artery (N/A) INTRAOPERATIVE TRANSESOPHAGEAL ECHOCARDIOGRAM (N/A)  Subjective: No complaints this am.   Objective: Vital signs in last 24 hours: Patient Vitals for the past 24 hrs:  BP Temp Temp src Pulse Resp SpO2 Weight  02/01/13 0353 121/79 mmHg 98.8 F (37.1 C) Oral 96 18 94 % 119 lb 12.8 oz (54.341 kg)  01/31/13 2054 95/61 mmHg 99 F (37.2 C) Oral 89 18 94 % -  01/31/13 1446 109/73 mmHg 98.6 F (37 C) Oral 90 18 98 % -   Current Weight  02/01/13 119 lb 12.8 oz (54.341 kg)     Intake/Output from previous day: 09/08 0701 - 09/09 0700 In: -  Out: 700 [Urine:700]    PHYSICAL EXAM:  Heart: RRR Lungs: Clear Wound: Clean and dry Extremities: No LE edema   Lab Results: CBC: Recent Labs  01/31/13 0532  WBC 7.7  HGB 8.0*  HCT 24.3*  PLT 152   BMET:  Recent Labs  01/31/13 0532  NA 136  K 4.3  CL 99  CO2 28  GLUCOSE 137*  BUN 17  CREATININE 0.78  CALCIUM 9.8    PT/INR: No results found for this basename: LABPROT, INR,  in the last 72 hours    Assessment/Plan: S/P Procedure(s) (LRB): CORONARY ARTERY BYPASS GRAFTING times two using Right Greater Saphenous Vein Graft Harvsted Endoscopically and Left Internal Mammary Artery (N/A) INTRAOPERATIVE TRANSESOPHAGEAL ECHOCARDIOGRAM (N/A) Stable, doing well. Life Vest arranged. Plan home today- instructions reviewed with patient.   LOS: 14 days    COLLINS,GINA H 02/01/2013  ADDENDUM:  Discussed with cardiology.  Since her BPs are improving, Dr. Herbie Baltimore favors starting low dose ACE-I.  Will Rx Lisinopril 2.5 mg po qhs per cardiology recommendations.    Chart reviewed, patient examined, agree with above.

## 2013-02-01 NOTE — Progress Notes (Signed)
Pt. Was educated on discharge instructions, given AVS, paper prescriptions.  All questions were answered. Will discharge home with family as ordered. Kathryn Dixon

## 2013-02-01 NOTE — Progress Notes (Signed)
Subjective:  No SOB, no CP  Objective:  Vital Signs in the last 24 hours: Temp:  [98.6 F (37 C)-99 F (37.2 C)] 98.8 F (37.1 C) (09/09 0353) Pulse Rate:  [89-96] 96 (09/09 0353) Resp:  [18] 18 (09/09 0353) BP: (95-121)/(61-79) 121/79 mmHg (09/09 0353) SpO2:  [94 %-98 %] 94 % (09/09 0353) Weight:  [119 lb 12.8 oz (54.341 kg)] 119 lb 12.8 oz (54.341 kg) (09/09 0353)  Intake/Output from previous day:  Intake/Output Summary (Last 24 hours) at 02/01/13 0800 Last data filed at 02/01/13 0300  Gross per 24 hour  Intake      0 ml  Output    700 ml  Net   -700 ml    Physical Exam: General appearance: alert, cooperative and no distress Lungs: few basilar rhonchi Heart: regular rate and rhythm Abd: soft, NT/ND/NABS Ext: no c/c/e   Rate: 78  Rhythm: normal sinus rhythm and premature ventricular contractions (PVC)  Lab Results:  Recent Labs  01/31/13 0532  WBC 7.7  HGB 8.0*  PLT 152    Recent Labs  01/31/13 0532  NA 136  K 4.3  CL 99  CO2 28  GLUCOSE 137*  BUN 17  CREATININE 0.78   No results found for this basename: TROPONINI, CK, MB,  in the last 72 hours No results found for this basename: INR,  in the last 72 hours  Imaging: Imaging results have been reviewed  Cardiac Studies:  Assessment/Plan:   Principal Problem:   NSTEMI (non-ST elevated myocardial infarction) Active Problems:   Dyspnea, acute   CAD, diffuse by cath 01/20/13- for CABG   Acute combined systolic and diastolic congestive heart failure, NYHA class 3   Aneurysmal dilatation both ascending and descending thoracic aorta wth mural thrombus   Cardiomyopathy, ischemic 20-25% with moderate MR   LBBB (left bundle branch block)   Tobacco abuse   Hypokalemia   UTI (urinary tract infection)-  E.Coli, TREATED with Cipro   Dyslipidemia   S/P CABG x 2 - LIMA-LAD, SVG-OM, 01/27/13   Postoperative anemia due to acute blood loss  PLAN: LifeVest arranged, we will arrange follow up in our office in  two weeks. Add ACE ~ 2.5 mg Lisionpril qhs   Corine Shelter PA-C Beeper [phone] 02/01/2013, 8:00 AM  I have seen and evaluated the patient this AM along with Corine Shelter, PA. I agree with his findings, examination as well as impression recommendations.  She is doing well POD #5; ready for d/c per Dr. Laneta Simmers.   No active Angina or CHF findings.  BP does seem tenuous, but would probably tolerate 2.5 mg Lisinopril. I do think that she will need long-standing Lasix.  We arrange close follow-up with PA in ~1-2 weeks - then with either me of Dr. Rennis Golden shortly after.  MD Time with pt: 10 min  HARDING,DAVID W, M.D., M.S. [facility] [address] [place], [place]  [address]  [phone] Pager # [phone] 02/01/2013 9:11 AM

## 2013-02-11 ENCOUNTER — Other Ambulatory Visit: Payer: Self-pay | Admitting: Physician Assistant

## 2013-02-15 DIAGNOSIS — Z48812 Encounter for surgical aftercare following surgery on the circulatory system: Secondary | ICD-10-CM

## 2013-02-16 ENCOUNTER — Encounter: Payer: Self-pay | Admitting: Cardiology

## 2013-02-16 ENCOUNTER — Ambulatory Visit (INDEPENDENT_AMBULATORY_CARE_PROVIDER_SITE_OTHER): Payer: Medicare Other | Admitting: Cardiology

## 2013-02-16 VITALS — BP 130/82 | HR 102 | Ht 65.0 in | Wt 116.8 lb

## 2013-02-16 DIAGNOSIS — I255 Ischemic cardiomyopathy: Secondary | ICD-10-CM

## 2013-02-16 DIAGNOSIS — Z951 Presence of aortocoronary bypass graft: Secondary | ICD-10-CM

## 2013-02-16 DIAGNOSIS — I2510 Atherosclerotic heart disease of native coronary artery without angina pectoris: Secondary | ICD-10-CM

## 2013-02-16 DIAGNOSIS — I2589 Other forms of chronic ischemic heart disease: Secondary | ICD-10-CM

## 2013-02-16 DIAGNOSIS — I447 Left bundle-branch block, unspecified: Secondary | ICD-10-CM

## 2013-02-16 MED ORDER — METOPROLOL TARTRATE 25 MG PO TABS: 12.5000 mg | ORAL_TABLET | Freq: Two times a day (BID) | ORAL | Status: DC

## 2013-02-16 NOTE — Assessment & Plan Note (Signed)
Here for first post op office visit.

## 2013-02-16 NOTE — Assessment & Plan Note (Signed)
She is wearing a Technical sales engineer. Medical Rx limited by low B/P

## 2013-02-16 NOTE — Patient Instructions (Addendum)
Take a baby aspirin (81 mg daily). Return in 4 weeks to see Dr Rennis Golden. It's OK to take OTC Prilosec. Your physician has recommended you make the following change in your medication: increase Metoprolol to 12.5 mg twice a day.

## 2013-02-16 NOTE — Assessment & Plan Note (Signed)
Seen post op in the office today, doing well

## 2013-02-16 NOTE — Progress Notes (Signed)
02/16/2013 Kathryn Dixon   08-01-1945  [id number]  Primary Physicia Billee Cashing, MD Primary Cardiologist: Dr Rennis Golden  HPI:  67 y/o who presented 01/18/13 with NSTEMI. Work up revealed severe CAD. She is fairly debilitated and is in a wheel chair.It was decided to proceed with as simple an operation as possible and on 01/18/13 she had CABG X 2 with an LIMA-LAD and an SVG-OM1. Her EF is depressed and she was discharged with a Technical sales engineer. She is in the office today for follow up. She has not had unusual SOB or edema. She denies tachycardia. Her appetite is "fair".   Current Outpatient Prescriptions  Medication Sig Dispense Refill  . atorvastatin (LIPITOR) 10 MG tablet Take 1 tablet (10 mg total) by mouth daily.  30 tablet  1  . lisinopril (PRINIVIL,ZESTRIL) 2.5 MG tablet Take 1 tablet (2.5 mg total) by mouth at bedtime.  30 tablet  1  . metoprolol tartrate (LOPRESSOR) 25 MG tablet Take 25 mg by mouth 2 (two) times daily. 1/4 tablet      . oxyCODONE (OXY IR/ROXICODONE) 5 MG immediate release tablet Take 1-2 tablets (5-10 mg total) by mouth every 3 (three) hours as needed for pain.  30 tablet  0  . aspirin EC 325 MG EC tablet Take 1 tablet (325 mg total) by mouth daily.  30 tablet  0  . ferrous gluconate (FERGON) 324 MG tablet Take 1 tablet (324 mg total) by mouth 2 (two) times daily with a meal.  60 tablet  0   No current facility-administered medications for this visit.    No Known Allergies  History   Social History  . Marital Status: Legally Separated    Spouse Name: N/A    Number of Children: N/A  . Years of Education: N/A   Occupational History  . Not on file.   Social History Main Topics  . Smoking status: Current Every Day Smoker -- 1.50 packs/day    Types: Cigarettes  . Smokeless tobacco: Never Used  . Alcohol Use: Yes     Comment: occ  . Drug Use: No  . Sexual Activity: Not on file   Other Topics Concern  . Not on file   Social History Narrative  . No narrative on  file     Review of Systems: General: negative for chills, fever, night sweats or weight changes.  Cardiovascular: negative for chest pain, dyspnea on exertion, edema, orthopnea, palpitations, paroxysmal nocturnal dyspnea or shortness of breath Dermatological: negative for rash Respiratory: negative for cough or wheezing Urologic: negative for hematuria Abdominal: negative for nausea, vomiting, diarrhea, bright red blood per rectum, melena, or hematemesis Neurologic: negative for visual changes, syncope, or dizziness All other systems reviewed and are otherwise negative except as noted above.    Blood pressure 130/82, pulse 102, height 5\' 5"  (1.651 m), weight 116 lb 12.8 oz (52.98 kg).  General appearance: alert, cooperative, no distress and frail, in wheel chair Lungs: clear to auscultation bilaterally Heart: regular rate and rhythm Extremities: no edema  EKG NSR, LBBB  ASSESSMENT AND PLAN:   Cardiomyopathy, ischemic 20-25% with moderate MR She is wearing a Technical sales engineer. Medical Rx limited by low B/P  S/P CABG x 2 - LIMA-LAD, SVG-OM, 01/27/13 Here for first post op office visit.   PLAN  Same Rx. I suggested she take Prilosec OTC if she was having nausea. Follow up with MD in 4 weeks, repeat echo in (?) 8 weeks. Increase metoprolol to 12.5 mg BID. At her  return office visit we may be able to increase her ACE.  Jena Tegeler KPA-C 02/16/2013 3:13 PM

## 2013-02-17 ENCOUNTER — Encounter: Payer: Self-pay | Admitting: Cardiology

## 2013-02-18 ENCOUNTER — Telehealth: Payer: Self-pay | Admitting: Internal Medicine

## 2013-02-18 ENCOUNTER — Telehealth: Payer: Self-pay | Admitting: Physician Assistant

## 2013-02-18 ENCOUNTER — Telehealth: Payer: Self-pay | Admitting: Cardiology

## 2013-02-18 NOTE — Telephone Encounter (Signed)
Patient had called requesting permission about taking iron and some kind of telephonic programs.  I called her back and left a message on her voicemail.  Kathryn Dixon 4:54 PM

## 2013-02-18 NOTE — Telephone Encounter (Signed)
Routed to [employer].A.

## 2013-02-18 NOTE — Telephone Encounter (Signed)
Would like to if she was restarted on Iron and also whether or not would he recommend one your internal disease management for heart disease . These are telephonic programs , would like to speak to a PA  .Marland Kitchen Please Call   Thanks

## 2013-02-18 NOTE — Telephone Encounter (Signed)
Is wanting to know if she can get a refill  on  her pain medication , which is Oxycodonce HCL 5mg  .. She is asking because the bottle reads as no refills .Marland Kitchen Please Call

## 2013-02-21 ENCOUNTER — Other Ambulatory Visit: Payer: Self-pay | Admitting: *Deleted

## 2013-02-21 DIAGNOSIS — I2510 Atherosclerotic heart disease of native coronary artery without angina pectoris: Secondary | ICD-10-CM

## 2013-02-21 NOTE — Telephone Encounter (Signed)
Returned call.  Left message to call back before 4pm.  Pt will need to contact surgeon, Dr. Laneta Simmers for refill on pain meds.  Pt has appt on Wednesday, 10.1.14.

## 2013-02-23 ENCOUNTER — Ambulatory Visit
Admission: RE | Admit: 2013-02-23 | Discharge: 2013-02-23 | Disposition: A | Discharge status: Home / Self Care | Payer: Medicare Other | Source: Ambulatory Visit | Attending: Surgery | Admitting: Surgery

## 2013-02-23 ENCOUNTER — Ambulatory Visit: Payer: Medicare Other | Admitting: Surgery

## 2013-02-23 ENCOUNTER — Encounter: Payer: Self-pay | Admitting: Surgery

## 2013-02-23 ENCOUNTER — Ambulatory Visit (INDEPENDENT_AMBULATORY_CARE_PROVIDER_SITE_OTHER): Payer: Self-pay | Admitting: Surgery

## 2013-02-23 VITALS — BP 118/74 | HR 92 | Resp 20 | Ht 65.0 in | Wt 116.0 lb

## 2013-02-23 DIAGNOSIS — I2510 Atherosclerotic heart disease of native coronary artery without angina pectoris: Secondary | ICD-10-CM

## 2013-02-23 DIAGNOSIS — Z951 Presence of aortocoronary bypass graft: Secondary | ICD-10-CM

## 2013-02-24 ENCOUNTER — Encounter: Payer: Self-pay | Admitting: Surgery

## 2013-02-24 NOTE — Progress Notes (Signed)
      [address]       [name] [address]             [phone]        HPI: Patient returns for routine postoperative follow-up having undergone coronary artery bypass x 2  on 01/27/2013. The patient's early postoperative recovery while in the hospital was notable for an uncomplicated postop course. Since hospital discharge the patient reports that she has been feeling well. She is wearing a Life Vest due to low EF preop.   Current Outpatient Prescriptions  Medication Sig Dispense Refill  . aspirin EC 325 MG EC tablet Take 1 tablet (325 mg total) by mouth daily.  30 tablet  0  . atorvastatin (LIPITOR) 10 MG tablet Take 1 tablet (10 mg total) by mouth daily.  30 tablet  1  . lisinopril (PRINIVIL,ZESTRIL) 2.5 MG tablet Take 1 tablet (2.5 mg total) by mouth at bedtime.  30 tablet  1  . metoprolol tartrate (LOPRESSOR) 25 MG tablet Take 0.5 tablets (12.5 mg total) by mouth 2 (two) times daily.  180 tablet  3  . oxyCODONE (OXY IR/ROXICODONE) 5 MG immediate release tablet Take 1-2 tablets (5-10 mg total) by mouth every 3 (three) hours as needed for pain.  30 tablet  0   No current facility-administered medications for this visit.    Physical Exam: BP 118/74  Pulse 92  Resp 20  Ht 5\' 5"  (1.651 m)  Wt 116 lb (52.617 kg)  BMI 19.3 kg/m2  SpO2 97% She looks well Lungs are clear Cardiac exam shows a regular rate and rhythm The chest incision is healing well and the sternum is stable The leg incision is healing well and there is no peripheral edema  Diagnostic Tests:  CLINICAL DATA:  67 year old female with weakness. History of CABG and coronary artery disease.   EXAM: CHEST  2 VIEW   COMPARISON:  01/29/2013 and earlier.   FINDINGS: Stable cardiomegaly and mediastinal contours. Sequelae of CABG. Visualized tracheal air column is within normal limits.   Resolved bilateral pulmonary edema and significantly improved ventilation. No pleural effusion or  pneumothorax. Chronically tortuous/aneurysmal thoracic aorta. Radiographic mediastinal contours are stable. No acute osseous abnormality identified.   IMPRESSION: Interval resolved pulmonary edema and No acute cardiopulmonary abnormality. Chronic thoracic aortic aneurysm, most recently evaluated with CTA on 01/19/2013.     Electronically Signed   By: Augusto Gamble M.D.   On: 02/23/2013 16:10   Impression:  She is doing well following CABG surgery. I asked her not to lift anything heavier than 10 lbs for 3 months postop.  Plan:  She will continue to follow up with cardiology and will have her LVEF reevaluated to decide if she needs an ICD.

## 2013-03-15 ENCOUNTER — Telehealth (HOSPITAL_COMMUNITY): Payer: Self-pay | Admitting: Cardiac Rehabilitation

## 2013-03-15 NOTE — Telephone Encounter (Signed)
Pt was contacted to enroll in cardiac rehab services. After many discussions about program, Pt declined stating she is not interested.  Dr. Tresa Endo made aware.

## 2013-03-17 ENCOUNTER — Encounter: Payer: Self-pay | Admitting: Cardiology

## 2013-03-17 ENCOUNTER — Other Ambulatory Visit: Payer: Self-pay | Admitting: *Deleted

## 2013-03-17 ENCOUNTER — Ambulatory Visit (INDEPENDENT_AMBULATORY_CARE_PROVIDER_SITE_OTHER): Payer: Medicare Other | Admitting: Cardiology

## 2013-03-17 VITALS — BP 130/80 | HR 89 | Ht 64.0 in | Wt 121.0 lb

## 2013-03-17 DIAGNOSIS — I447 Left bundle-branch block, unspecified: Secondary | ICD-10-CM

## 2013-03-17 DIAGNOSIS — I2589 Other forms of chronic ischemic heart disease: Secondary | ICD-10-CM

## 2013-03-17 DIAGNOSIS — F17200 Nicotine dependence, unspecified, uncomplicated: Secondary | ICD-10-CM | POA: Insufficient documentation

## 2013-03-17 DIAGNOSIS — Z951 Presence of aortocoronary bypass graft: Secondary | ICD-10-CM

## 2013-03-17 DIAGNOSIS — I255 Ischemic cardiomyopathy: Secondary | ICD-10-CM

## 2013-03-17 MED ORDER — METOPROLOL TARTRATE 25 MG PO TABS: 25.0000 mg | ORAL_TABLET | Freq: Two times a day (BID) | ORAL | Status: DC

## 2013-03-17 MED ORDER — LISINOPRIL 2.5 MG PO TABS: 5.0000 mg | ORAL_TABLET | Freq: Every day | ORAL | Status: DC

## 2013-03-17 NOTE — Patient Instructions (Addendum)
STOP SMOKING Low salt diet. Echo in one month Follow up after echo Increase Lopressor to 25 mg twice a day Increase Prinivil to 5 mg daily

## 2013-03-17 NOTE — Assessment & Plan Note (Signed)
She is wearing a Technical sales engineer

## 2013-03-17 NOTE — Progress Notes (Signed)
03/17/2013 Kathryn Dixon   11/18/1945  [id number]  Primary Physicia Billee Cashing, MD Primary Cardiologist:   HPI:  67 y/o who presented 01/18/13 with NSTEMI. Work up revealed severe CAD. She is fairly debilitated and is in a wheel chair.It was decided to proceed with as simple an operation as possible and on 01/18/13 she had CABG X 2 with an LIMA-LAD and an SVG-OM1. Her EF is depressed and she was discharged with a Life Vest. I saw her in the office 02/16/13 and she was still recovering from her CABG. She is here today for follow up. She is doing better, stronger, no unusual SOB. She has started smoking some again.    Current Outpatient Prescriptions  Medication Sig Dispense Refill  . aspirin EC 325 MG EC tablet Take 1 tablet (325 mg total) by mouth daily.  30 tablet  0  . atorvastatin (LIPITOR) 10 MG tablet Take 1 tablet (10 mg total) by mouth daily.  30 tablet  1  . lisinopril (PRINIVIL,ZESTRIL) 2.5 MG tablet Take 2 tablets (5 mg total) by mouth at bedtime.  90 tablet  3  . metoprolol tartrate (LOPRESSOR) 25 MG tablet Take 1 tablet (25 mg total) by mouth 2 (two) times daily.  180 tablet  3  . oxyCODONE (OXY IR/ROXICODONE) 5 MG immediate release tablet Take 1-2 tablets (5-10 mg total) by mouth every 3 (three) hours as needed for pain.  30 tablet  0   No current facility-administered medications for this visit.    No Known Allergies  History   Social History  . Marital Status: Legally Separated    Spouse Name: N/A    Number of Children: N/A  . Years of Education: N/A   Occupational History  . Not on file.   Social History Main Topics  . Smoking status: Current Every Day Smoker -- 1.50 packs/day    Types: Cigarettes  . Smokeless tobacco: Never Used  . Alcohol Use: Yes     Comment: occ  . Drug Use: No  . Sexual Activity: Not on file   Other Topics Concern  . Not on file   Social History Narrative  . No narrative on file     Review of Systems: General: negative for  chills, fever, night sweats or weight changes.  Cardiovascular: negative for chest pain, dyspnea on exertion, edema, orthopnea, palpitations, paroxysmal nocturnal dyspnea or shortness of breath Dermatological: negative for rash Respiratory: negative for cough or wheezing Urologic: negative for hematuria Abdominal: negative for nausea, vomiting, diarrhea, bright red blood per rectum, melena, or hematemesis Neurologic: negative for visual changes, syncope, or dizziness All other systems reviewed and are otherwise negative except as noted above.    Blood pressure 130/80, pulse 89, height 5\' 4"  (1.626 m), weight 121 lb (54.885 kg).  General appearance: alert, cooperative, cachectic and no distress Lungs: clear to auscultation bilaterally Heart: regular rate and rhythm and soft S3  Extremities: no edema  EKG NSR, LBBB  ASSESSMENT AND PLAN:   S/P CABG x 2 - LIMA-LAD, SVG-OM, 01/27/13 No angina  Cardiomyopathy, ischemic 20-25% with moderate MR She is wearing a Life Vest  LBBB (left bundle branch block) .  Smoker .   PLAN  Despite the fact that she looks better, (stronger), I fear that her LVD is not improved based on her exam today- (S3). I ordered an echo for November which will be 3 months post op. If her LVF is strill less than 35 she should be referred for possible BiV  ICD therapy, (LBBB). I did increase her ACE and beta blocker today and will see her post echo.   Anwitha Mapes KPA-C 03/17/2013 5:21 PM

## 2013-03-17 NOTE — Assessment & Plan Note (Signed)
No angina 

## 2013-03-18 ENCOUNTER — Encounter: Payer: Self-pay | Admitting: Cardiology

## 2013-03-27 ENCOUNTER — Other Ambulatory Visit: Payer: Self-pay | Admitting: Physician Assistant

## 2013-04-04 ENCOUNTER — Ambulatory Visit (HOSPITAL_COMMUNITY)
Admission: RE | Admit: 2013-04-04 | Discharge: 2013-04-04 | Disposition: A | Discharge status: Home / Self Care | Payer: Medicare Other | Source: Ambulatory Visit | Attending: Cardiovascular Disease | Admitting: Cardiovascular Disease

## 2013-04-04 ENCOUNTER — Telehealth: Payer: Self-pay | Admitting: *Deleted

## 2013-04-04 DIAGNOSIS — I428 Other cardiomyopathies: Secondary | ICD-10-CM | POA: Insufficient documentation

## 2013-04-04 DIAGNOSIS — I255 Ischemic cardiomyopathy: Secondary | ICD-10-CM

## 2013-04-04 DIAGNOSIS — I2589 Other forms of chronic ischemic heart disease: Secondary | ICD-10-CM

## 2013-04-04 NOTE — Telephone Encounter (Signed)
Notified by Aram Beecham at front desk that pt wants a nurse to look at her feet, they are swollen.    RN spoke w/ pt and pt c/o bilateral swelling in feet.  Denied other symptoms.  RN asked pt if she stands or has feet on floor a lot and pt stated she does.  Stated she usually sits w/ her feet on the floor.  Pt advised to elevate LEs above level of the heart (pillow/blanket) when sitting and lying down and decrease NA+ intake, especially processed foods.  Pt also advised to call back if no improvement in 24-48 hrs.  Pt verbalized understanding and agreed w/ plan.

## 2013-04-04 NOTE — Progress Notes (Signed)
2D Echo Performed 04/04/2013    Sandra Tellefsen, RCS  

## 2013-04-19 ENCOUNTER — Ambulatory Visit (INDEPENDENT_AMBULATORY_CARE_PROVIDER_SITE_OTHER): Payer: Medicare Other | Admitting: Cardiology

## 2013-04-19 ENCOUNTER — Encounter: Payer: Self-pay | Admitting: Cardiology

## 2013-04-19 VITALS — BP 120/80 | HR 78 | Ht 64.0 in | Wt 117.0 lb

## 2013-04-19 DIAGNOSIS — Z951 Presence of aortocoronary bypass graft: Secondary | ICD-10-CM

## 2013-04-19 DIAGNOSIS — I447 Left bundle-branch block, unspecified: Secondary | ICD-10-CM

## 2013-04-19 DIAGNOSIS — I2589 Other forms of chronic ischemic heart disease: Secondary | ICD-10-CM

## 2013-04-19 DIAGNOSIS — I255 Ischemic cardiomyopathy: Secondary | ICD-10-CM

## 2013-04-19 MED ORDER — LISINOPRIL 5 MG PO TABS: 5.0000 mg | ORAL_TABLET | Freq: Every day | ORAL | Status: DC

## 2013-04-19 MED ORDER — HYDROCHLOROTHIAZIDE 12.5 MG PO CAPS: 12.5000 mg | ORAL_CAPSULE | Freq: Every day | ORAL | Status: DC

## 2013-04-19 NOTE — Patient Instructions (Addendum)
Your physician recommends that you schedule a follow-up appointment in: 3 weeks with Dr Ladona Ridgel for EP consult. Your physician has requested that you have an echocardiogram. Echocardiography is a painless test that uses sound waves to create images of your heart. It provides your doctor with information about the size and shape of your heart and how well your heart's chambers and valves are working. This procedure takes approximately one hour. There are no restrictions for this procedure. Your physician recommends that you return for lab work in: 2 weeks when you come for your echo Start HCTZ 12.5 mg daily. Increase Lisinopril to 5 mg before bed daily No smoking!

## 2013-04-19 NOTE — Progress Notes (Signed)
04/19/2013 Kathryn Dixon   1946-03-07  [id number]  Primary Physicia Billee Cashing, MD Primary Cardiologist: Dr Rennis Golden  HPI:  67 y/o who presented 01/18/13 with NSTEMI. Work up revealed severe CAD. She was fairly debilitated and in a wheel chair. It was decided to proceed with as simple an operation as possible and on 01/18/13 she had CABG X 2 with an LIMA-LAD and an SVG-OM1. Her EF is depressed and she was discharged with a Life Vest. I saw her in the office 02/16/13 and she was still recovering from her CABG. Her mediations have been adjusted as an OP. An echo done 04/04/13 shows her EF to be depressed at 20-25%. She may be a candidate for a BiV ICD and will need to be referred to EP for evaluation.  Today she says she is doing well except for some lower extremity edema.     Current Outpatient Prescriptions  Medication Sig Dispense Refill  . aspirin 81 MG tablet Take 81 mg by mouth daily.      . metoprolol tartrate (LOPRESSOR) 25 MG tablet Take 1 tablet (25 mg total) by mouth 2 (two) times daily.  180 tablet  3  . atorvastatin (LIPITOR) 10 MG tablet Take 1 tablet (10 mg total) by mouth daily.  30 tablet  1  . hydrochlorothiazide (MICROZIDE) 12.5 MG capsule Take 1 capsule (12.5 mg total) by mouth daily.  90 capsule  3  . lisinopril (PRINIVIL,ZESTRIL) 5 MG tablet Take 1 tablet (5 mg total) by mouth at bedtime.  90 tablet  3   No current facility-administered medications for this visit.    No Known Allergies  History   Social History  . Marital Status: Legally Separated    Spouse Name: N/A    Number of Children: N/A  . Years of Education: N/A   Occupational History  . Not on file.   Social History Main Topics  . Smoking status: Current Some Day Smoker -- 1.50 packs/day    Types: Cigarettes  . Smokeless tobacco: Never Used  . Alcohol Use: Yes     Comment: occ  . Drug Use: No  . Sexual Activity: Not on file   Other Topics Concern  . Not on file   Social History Narrative  .  No narrative on file     Review of Systems: General: negative for chills, fever, night sweats or weight changes.  Cardiovascular: negative for chest pain, dyspnea on exertion, edema, orthopnea, palpitations, paroxysmal nocturnal dyspnea or shortness of breath Dermatological: negative for rash Respiratory: negative for cough or wheezing Urologic: negative for hematuria Abdominal: negative for nausea, vomiting, diarrhea, bright red blood per rectum, melena, or hematemesis Neurologic: negative for visual changes, syncope, or dizziness All other systems reviewed and are otherwise negative except as noted above.    Blood pressure 120/80, pulse 78, height 5\' 4"  (1.626 m), weight 117 lb (53.071 kg).  General appearance: alert, cooperative and no distress Lungs: decreased at bases Heart: regular rate and rhythm Extremities: trace edema    ASSESSMENT AND PLAN:   Cardiomyopathy, ischemic 20-25% with moderate MR She is not yet 3 months out- repeat echo 05/02/13  S/P CABG x 2 - LIMA-LAD, SVG-OM, 01/27/13 No angina  LBBB (left bundle branch block) ? BiV ICD candidate if her LVF is depressed at her 3 month echo.   PLAN  Add HCTZ 12.5 mg daily, increase Lisinopril to 5 mg Q HS. She will need EP follow up for possible Biv ICD.  [facility] KPA-C 04/19/2013  4:39 PM

## 2013-04-19 NOTE — Assessment & Plan Note (Signed)
No angina 

## 2013-04-19 NOTE — Assessment & Plan Note (Signed)
She is not yet 3 months out- repeat echo 05/02/13

## 2013-04-19 NOTE — Assessment & Plan Note (Signed)
?   BiV ICD candidate if her LVF is depressed at her 3 month echo.

## 2013-04-25 ENCOUNTER — Telehealth (HOSPITAL_COMMUNITY): Payer: Self-pay | Admitting: *Deleted

## 2013-04-26 ENCOUNTER — Telehealth (HOSPITAL_COMMUNITY): Payer: Self-pay | Admitting: *Deleted

## 2013-04-26 NOTE — Telephone Encounter (Signed)
Message forwarded to Ebony in Ancillary Scheduling.  

## 2013-04-26 NOTE — Telephone Encounter (Signed)
Yes - get the echo, her MI was in August but her revascularization was 01/28/13. An echo on 04/29/13 will be 3 months after revascularization and that is what we are looking for.  Corine Shelter PA-C 04/26/2013 11:18 AM

## 2013-04-26 NOTE — Telephone Encounter (Signed)
Echo on 12.15.14 was cancelled on 11.25.14, stating pt did not need it per Corine Shelter, PA-C.  Message forwarded to Hinda Glatter, PA-C for further instructions.

## 2013-04-26 NOTE — Telephone Encounter (Signed)
Pt was scheduled for an echo on 12/15 but was cancelled because the it wasn't needed at the time. There is now another order in for an echo. Does the patient really need it now or is it a duplicate order.

## 2013-05-09 ENCOUNTER — Other Ambulatory Visit (HOSPITAL_COMMUNITY): Payer: Self-pay | Admitting: Cardiology

## 2013-05-09 ENCOUNTER — Ambulatory Visit (HOSPITAL_COMMUNITY): Payer: Medicare Other

## 2013-05-09 ENCOUNTER — Ambulatory Visit (HOSPITAL_COMMUNITY)
Admission: RE | Admit: 2013-05-09 | Discharge: 2013-05-09 | Disposition: A | Discharge status: Home / Self Care | Payer: Medicare Other | Source: Ambulatory Visit | Attending: Cardiology | Admitting: Cardiology

## 2013-05-09 DIAGNOSIS — I2589 Other forms of chronic ischemic heart disease: Secondary | ICD-10-CM | POA: Insufficient documentation

## 2013-05-09 DIAGNOSIS — I255 Ischemic cardiomyopathy: Secondary | ICD-10-CM

## 2013-05-09 DIAGNOSIS — I447 Left bundle-branch block, unspecified: Secondary | ICD-10-CM | POA: Insufficient documentation

## 2013-05-09 NOTE — Progress Notes (Signed)
2D Echo Performed 05/09/2013    Clearence Ped, RCS

## 2013-05-10 ENCOUNTER — Encounter: Payer: Self-pay | Admitting: Internal Medicine

## 2013-05-10 ENCOUNTER — Institutional Professional Consult (permissible substitution): Payer: Medicare Other | Admitting: Internal Medicine

## 2013-05-10 ENCOUNTER — Ambulatory Visit (INDEPENDENT_AMBULATORY_CARE_PROVIDER_SITE_OTHER): Payer: Medicare Other | Admitting: Internal Medicine

## 2013-05-10 VITALS — BP 122/88 | HR 88 | Ht 64.0 in | Wt 121.8 lb

## 2013-05-10 DIAGNOSIS — I2589 Other forms of chronic ischemic heart disease: Secondary | ICD-10-CM

## 2013-05-10 DIAGNOSIS — I255 Ischemic cardiomyopathy: Secondary | ICD-10-CM

## 2013-05-10 NOTE — Assessment & Plan Note (Signed)
The patient has severe left ventricular dysfunction despite bypass surgery. She has class II heart failure symptoms. I've discussed the treatment options in detail. The risk, goals, benefits, and expectations of biventricular ICD implantation have been discussed with the patient, and she wishes to proceed. She currently is on maximal medical therapy with beta blocker, an ACE inhibitor, and diuretic, and has greater than one-year expected longevity.

## 2013-05-10 NOTE — Patient Instructions (Signed)
Your physician has recommended that you have a defibrillator inserted. An implantable cardioverter defibrillator (ICD) is a small device that is placed in your chest or, in rare cases, your abdomen. This device uses electrical pulses or shocks to help control life-threatening, irregular heartbeats that could lead the heart to suddenly stop beating (sudden cardiac arrest). Leads are attached to the ICD that goes into your heart. This is done in the hospital and usually requires an overnight stay. Please see the instruction sheet given to you today for more information.   Please call Anselm Pancoast after discussing with your family  [phone]

## 2013-05-10 NOTE — Progress Notes (Signed)
    HPI Kathryn Dixon is referred today by Luke Kilroy for consideration for biventricular ICD implantation. The patient is a very pleasant 67-year-old woman with an ischemic cardiomyopathy, chronic systolic heart failure, left bundle branch block, status post bypass surgery 3 months ago. Her ejection fraction did not improve postoperatively paced on an echo done several days ago. The patient has class II heart failure symptoms. She is a QRS duration of 160 - 170 ms with left bundle branch block. She has never had syncope. She denies peripheral edema. Since discharge from the hospital following bypass surgery, the patient has worn a life vest. No Known Allergies   Current Outpatient Prescriptions  Medication Sig Dispense Refill  . aspirin 81 MG tablet Take 81 mg by mouth daily.      . hydrochlorothiazide (MICROZIDE) 12.5 MG capsule Take 1 capsule (12.5 mg total) by mouth daily.  90 capsule  3  . lisinopril (PRINIVIL,ZESTRIL) 5 MG tablet Take 1 tablet (5 mg total) by mouth at bedtime.  90 tablet  3  . metoprolol tartrate (LOPRESSOR) 25 MG tablet Take 1 tablet (25 mg total) by mouth 2 (two) times daily.  180 tablet  3   No current facility-administered medications for this visit.     Past Medical History  Diagnosis Date  . Hypertension   . Coronary artery disease   . LBBB (left bundle branch block)   . Ischemic cardiomyopathy   . Thoracic aneurysm without mention of rupture   . Smoker   . S/P CABG x 2 - LIMA-LAD, SVG-OM, 01/27/13 01/28/2013    ROS:   All systems reviewed and negative except as noted in the HPI.   Past Surgical History  Procedure Laterality Date  . Abdominal hysterectomy    . Fracture surgery    . Coronary artery bypass graft N/A 01/27/2013    Procedure: CORONARY ARTERY BYPASS GRAFTING times two using Right Greater Saphenous Vein Graft Harvsted Endoscopically and Left Internal Mammary Artery;  Surgeon: Bryan K Bartle, MD;  Location: [facility] OR;  Service: Open Heart  Surgery;  Laterality: N/A;  . Intraoperative transesophageal echocardiogram N/A 01/27/2013    Procedure: INTRAOPERATIVE TRANSESOPHAGEAL ECHOCARDIOGRAM;  Surgeon: Bryan K Bartle, MD;  Location: [facility] OR;  Service: Open Heart Surgery;  Laterality: N/A;     No family history on file.   History   Social History  . Marital Status: Legally Separated    Spouse Name: N/A    Number of Children: N/A  . Years of Education: N/A   Occupational History  . Not on file.   Social History Main Topics  . Smoking status: Current Some Day Smoker -- 1.50 packs/day    Types: Cigarettes  . Smokeless tobacco: Never Used  . Alcohol Use: Yes     Comment: occ  . Drug Use: No  . Sexual Activity: Not on file   Other Topics Concern  . Not on file   Social History Narrative  . No narrative on file     BP 122/88  Pulse 88  Ht 5' 4" (1.626 m)  Wt 121 lb 12.8 oz (55.248 kg)  BMI 20.90 kg/m2  Physical Exam:  Well appearing 67-year-old woman, NAD, wearing a portable life vest HEENT: Unremarkable Neck:  7 cm JVD, no thyromegally Back:  No CVA tenderness Lungs:  Clear with no wheezes, rales, or rhonchi. HEART:  Regular rate rhythm, no murmurs, no rubs, no clicks Abd:  soft, positive bowel sounds, no organomegally, no rebound, no guarding   Ext:  2 plus pulses, no edema, no cyanosis, no clubbing Skin:  No rashes no nodules Neuro:  CN II through XII intact, motor grossly intact  EKg - normal sinus rhythm with left bundle branch block  Assess/Plan: 

## 2013-05-16 ENCOUNTER — Other Ambulatory Visit: Payer: Self-pay | Admitting: *Deleted

## 2013-05-16 DIAGNOSIS — I447 Left bundle-branch block, unspecified: Secondary | ICD-10-CM

## 2013-05-16 DIAGNOSIS — I255 Ischemic cardiomyopathy: Secondary | ICD-10-CM

## 2013-05-27 ENCOUNTER — Encounter (HOSPITAL_COMMUNITY): Payer: Self-pay | Admitting: Pharmacy Technician

## 2013-05-31 MED ORDER — CEFAZOLIN SODIUM-DEXTROSE 2-3 GM-% IV SOLR
2.0000 g | INTRAVENOUS | Status: DC
  Filled 2013-05-31: qty 50

## 2013-05-31 MED ORDER — GENTAMICIN SULFATE 40 MG/ML IJ SOLN
80.0000 mg | INTRAMUSCULAR | Status: DC
  Filled 2013-05-31 (×2): qty 2

## 2013-06-01 ENCOUNTER — Ambulatory Visit (HOSPITAL_COMMUNITY)
Admission: RE | Admit: 2013-06-01 | Discharge: 2013-06-02 | Disposition: A | Discharge status: Home / Self Care | Payer: Medicare Other | Source: Ambulatory Visit | Attending: Internal Medicine | Admitting: Internal Medicine

## 2013-06-01 ENCOUNTER — Encounter (HOSPITAL_COMMUNITY): Payer: Self-pay | Admitting: General Practice

## 2013-06-01 ENCOUNTER — Encounter (HOSPITAL_COMMUNITY)
Admission: RE | Disposition: A | Discharge status: Home / Self Care | Payer: Self-pay | Source: Ambulatory Visit | Attending: Internal Medicine

## 2013-06-01 DIAGNOSIS — Z951 Presence of aortocoronary bypass graft: Secondary | ICD-10-CM

## 2013-06-01 DIAGNOSIS — Z720 Tobacco use: Secondary | ICD-10-CM

## 2013-06-01 DIAGNOSIS — E876 Hypokalemia: Secondary | ICD-10-CM

## 2013-06-01 DIAGNOSIS — I214 Non-ST elevation (NSTEMI) myocardial infarction: Secondary | ICD-10-CM

## 2013-06-01 DIAGNOSIS — I5022 Chronic systolic (congestive) heart failure: Secondary | ICD-10-CM

## 2013-06-01 DIAGNOSIS — I252 Old myocardial infarction: Secondary | ICD-10-CM | POA: Insufficient documentation

## 2013-06-01 DIAGNOSIS — Z7982 Long term (current) use of aspirin: Secondary | ICD-10-CM | POA: Insufficient documentation

## 2013-06-01 DIAGNOSIS — I2589 Other forms of chronic ischemic heart disease: Secondary | ICD-10-CM

## 2013-06-01 DIAGNOSIS — I712 Thoracic aortic aneurysm, without rupture, unspecified: Secondary | ICD-10-CM | POA: Insufficient documentation

## 2013-06-01 DIAGNOSIS — I729 Aneurysm of unspecified site: Secondary | ICD-10-CM

## 2013-06-01 DIAGNOSIS — I509 Heart failure, unspecified: Secondary | ICD-10-CM | POA: Insufficient documentation

## 2013-06-01 DIAGNOSIS — I255 Ischemic cardiomyopathy: Secondary | ICD-10-CM

## 2013-06-01 DIAGNOSIS — I447 Left bundle-branch block, unspecified: Secondary | ICD-10-CM

## 2013-06-01 DIAGNOSIS — I2510 Atherosclerotic heart disease of native coronary artery without angina pectoris: Secondary | ICD-10-CM | POA: Insufficient documentation

## 2013-06-01 DIAGNOSIS — I10 Essential (primary) hypertension: Secondary | ICD-10-CM | POA: Insufficient documentation

## 2013-06-01 DIAGNOSIS — R0600 Dyspnea, unspecified: Secondary | ICD-10-CM

## 2013-06-01 DIAGNOSIS — D62 Acute posthemorrhagic anemia: Secondary | ICD-10-CM

## 2013-06-01 DIAGNOSIS — N390 Urinary tract infection, site not specified: Secondary | ICD-10-CM

## 2013-06-01 DIAGNOSIS — E785 Hyperlipidemia, unspecified: Secondary | ICD-10-CM

## 2013-06-01 DIAGNOSIS — F17200 Nicotine dependence, unspecified, uncomplicated: Secondary | ICD-10-CM

## 2013-06-01 HISTORY — DX: Acute myocardial infarction, unspecified: I21.9

## 2013-06-01 HISTORY — PX: BI-VENTRICULAR IMPLANTABLE CARDIOVERTER DEFIBRILLATOR: SHX5459

## 2013-06-01 HISTORY — DX: Shortness of breath: R06.02

## 2013-06-01 LAB — CBC
HCT: 40.9 % (ref 36.0–46.0)
Hemoglobin: 13.1 g/dL (ref 12.0–15.0)
MCH: 25.4 pg — AB (ref 26.0–34.0)
MCHC: 32 g/dL (ref 30.0–36.0)
MCV: 79.3 fL (ref 78.0–100.0)
Platelets: 137 10*3/uL — ABNORMAL LOW (ref 150–400)
RBC: 5.16 MIL/uL — AB (ref 3.87–5.11)
RDW: 17.2 % — AB (ref 11.5–15.5)
WBC: 4.9 10*3/uL (ref 4.0–10.5)

## 2013-06-01 LAB — BASIC METABOLIC PANEL
BUN: 24 mg/dL — ABNORMAL HIGH (ref 6–23)
CO2: 27 meq/L (ref 19–32)
CREATININE: 0.83 mg/dL (ref 0.50–1.10)
Calcium: 10 mg/dL (ref 8.4–10.5)
Chloride: 104 mEq/L (ref 96–112)
GFR calc Af Amer: 82 mL/min — ABNORMAL LOW (ref >90)
GFR calc non Af Amer: 71 mL/min — ABNORMAL LOW (ref >90)
GLUCOSE: 95 mg/dL (ref 70–99)
Potassium: 4.9 mEq/L (ref 3.7–5.3)
Sodium: 143 mEq/L (ref 137–147)

## 2013-06-01 LAB — SURGICAL PCR SCREEN
MRSA, PCR: NEGATIVE
Staphylococcus aureus: NEGATIVE

## 2013-06-01 SURGERY — BI-VENTRICULAR IMPLANTABLE CARDIOVERTER DEFIBRILLATOR  (CRT-D)
Anesthesia: LOCAL

## 2013-06-01 MED ORDER — INFLUENZA VAC SPLIT QUAD 0.5 ML IM SUSP
0.5000 mL | INTRAMUSCULAR | Status: DC
  Filled 2013-06-01: qty 0.5

## 2013-06-01 MED ORDER — FENTANYL CITRATE 0.05 MG/ML IJ SOLN
INTRAMUSCULAR | Status: AC
Start: 2013-06-01 — End: 2013-06-01
  Filled 2013-06-01: qty 2

## 2013-06-01 MED ORDER — CEFAZOLIN SODIUM 1-5 GM-% IV SOLN
1.0000 g | Freq: Four times a day (QID) | INTRAVENOUS | Status: AC
  Administered 2013-06-01 – 2013-06-02 (×3): 1 g via INTRAVENOUS
  Filled 2013-06-01 (×3): qty 50

## 2013-06-01 MED ORDER — ASPIRIN EC 81 MG PO TBEC
81.0000 mg | DELAYED_RELEASE_TABLET | Freq: Every day | ORAL | Status: DC
  Administered 2013-06-01 – 2013-06-02 (×2): 81 mg via ORAL
  Filled 2013-06-01 (×2): qty 1

## 2013-06-01 MED ORDER — ACETAMINOPHEN 325 MG PO TABS
325.0000 mg | ORAL_TABLET | ORAL | Status: DC | PRN
  Administered 2013-06-01 – 2013-06-02 (×3): 650 mg via ORAL
  Filled 2013-06-01 (×3): qty 2

## 2013-06-01 MED ORDER — HYDROCHLOROTHIAZIDE 12.5 MG PO CAPS
12.5000 mg | ORAL_CAPSULE | Freq: Every day | ORAL | Status: DC
  Administered 2013-06-01 – 2013-06-02 (×2): 12.5 mg via ORAL
  Filled 2013-06-01 (×2): qty 1

## 2013-06-01 MED ORDER — LIDOCAINE HCL (PF) 1 % IJ SOLN
INTRAMUSCULAR | Status: AC
  Filled 2013-06-01: qty 60

## 2013-06-01 MED ORDER — CHLORHEXIDINE GLUCONATE 4 % EX LIQD
60.0000 mL | Freq: Once | CUTANEOUS | Status: DC
  Filled 2013-06-01: qty 60

## 2013-06-01 MED ORDER — PNEUMOCOCCAL VAC POLYVALENT 25 MCG/0.5ML IJ INJ
0.5000 mL | INJECTION | INTRAMUSCULAR | Status: DC
  Filled 2013-06-01: qty 0.5

## 2013-06-01 MED ORDER — MIDAZOLAM HCL 5 MG/5ML IJ SOLN
INTRAMUSCULAR | Status: AC
  Filled 2013-06-01: qty 5

## 2013-06-01 MED ORDER — METOPROLOL TARTRATE 25 MG PO TABS
25.0000 mg | ORAL_TABLET | Freq: Two times a day (BID) | ORAL | Status: DC
  Administered 2013-06-01 – 2013-06-02 (×2): 25 mg via ORAL
  Filled 2013-06-01 (×3): qty 1

## 2013-06-01 MED ORDER — HEPARIN (PORCINE) IN NACL 2-0.9 UNIT/ML-% IJ SOLN
INTRAMUSCULAR | Status: AC
  Filled 2013-06-01: qty 500

## 2013-06-01 MED ORDER — SODIUM CHLORIDE 0.9 % IV SOLN
INTRAVENOUS | Status: DC
  Administered 2013-06-01: 09:00:00 via INTRAVENOUS

## 2013-06-01 MED ORDER — TRAMADOL HCL 50 MG PO TABS
50.0000 mg | ORAL_TABLET | Freq: Four times a day (QID) | ORAL | Status: DC | PRN
  Administered 2013-06-01: 50 mg via ORAL
  Filled 2013-06-01: qty 1

## 2013-06-01 MED ORDER — MUPIROCIN 2 % EX OINT
TOPICAL_OINTMENT | CUTANEOUS | Status: AC
  Filled 2013-06-01: qty 22

## 2013-06-01 MED ORDER — MUPIROCIN 2 % EX OINT
TOPICAL_OINTMENT | Freq: Two times a day (BID) | CUTANEOUS | Status: DC
  Administered 2013-06-01: 09:00:00 via NASAL
  Filled 2013-06-01: qty 22

## 2013-06-01 MED ORDER — ONDANSETRON HCL 4 MG/2ML IJ SOLN: 4.0000 mg | Freq: Four times a day (QID) | INTRAMUSCULAR | Status: DC | PRN

## 2013-06-01 MED ORDER — LISINOPRIL 5 MG PO TABS
5.0000 mg | ORAL_TABLET | Freq: Every day | ORAL | Status: DC
  Administered 2013-06-01: 5 mg via ORAL
  Filled 2013-06-01 (×2): qty 1

## 2013-06-01 NOTE — CV Procedure (Signed)
BiV ICD implant via the left subclavian vein without immediate complication. K#[id number].

## 2013-06-01 NOTE — H&P (View-Only) (Signed)
HPI Kathryn Dixon is referred today by Kerin Ransom for consideration for biventricular ICD implantation. The patient is a very pleasant 68 year old woman with an ischemic cardiomyopathy, chronic systolic heart failure, left bundle branch block, status post bypass surgery 3 months ago. Her ejection fraction did not improve postoperatively paced on an echo done several days ago. The patient has class II heart failure symptoms. She is a QRS duration of 160 - 170 ms with left bundle branch block. She has never had syncope. She denies peripheral edema. Since discharge from the hospital following bypass surgery, the patient has worn a life vest. No Known Allergies   Current Outpatient Prescriptions  Medication Sig Dispense Refill  . aspirin 81 MG tablet Take 81 mg by mouth daily.      . hydrochlorothiazide (MICROZIDE) 12.5 MG capsule Take 1 capsule (12.5 mg total) by mouth daily.  90 capsule  3  . lisinopril (PRINIVIL,ZESTRIL) 5 MG tablet Take 1 tablet (5 mg total) by mouth at bedtime.  90 tablet  3  . metoprolol tartrate (LOPRESSOR) 25 MG tablet Take 1 tablet (25 mg total) by mouth 2 (two) times daily.  180 tablet  3   No current facility-administered medications for this visit.     Past Medical History  Diagnosis Date  . Hypertension   . Coronary artery disease   . LBBB (left bundle branch block)   . Ischemic cardiomyopathy   . Thoracic aneurysm without mention of rupture   . Smoker   . S/P CABG x 2 - LIMA-LAD, SVG-OM, 01/27/13 01/28/2013    ROS:   All systems reviewed and negative except as noted in the HPI.   Past Surgical History  Procedure Laterality Date  . Abdominal hysterectomy    . Fracture surgery    . Coronary artery bypass graft N/A 01/27/2013    Procedure: CORONARY ARTERY BYPASS GRAFTING times two using Right Greater Saphenous Vein Graft Harvsted Endoscopically and Left Internal Mammary Artery;  Surgeon: Gaye Pollack, MD;  Location: [facility] OR;  Service: Open Heart  Surgery;  Laterality: N/A;  . Intraoperative transesophageal echocardiogram N/A 01/27/2013    Procedure: INTRAOPERATIVE TRANSESOPHAGEAL ECHOCARDIOGRAM;  Surgeon: Gaye Pollack, MD;  Location: [facility] OR;  Service: Open Heart Surgery;  Laterality: N/A;     No family history on file.   History   Social History  . Marital Status: Legally Separated    Spouse Name: N/A    Number of Children: N/A  . Years of Education: N/A   Occupational History  . Not on file.   Social History Main Topics  . Smoking status: Current Some Day Smoker -- 1.50 packs/day    Types: Cigarettes  . Smokeless tobacco: Never Used  . Alcohol Use: Yes     Comment: occ  . Drug Use: No  . Sexual Activity: Not on file   Other Topics Concern  . Not on file   Social History Narrative  . No narrative on file     BP 122/88  Pulse 88  Ht 5\' 4"  (1.626 m)  Wt 121 lb 12.8 oz (55.248 kg)  BMI 20.90 kg/m2  Physical Exam:  Well appearing 68 year old woman, NAD, wearing a portable life vest HEENT: Unremarkable Neck:  7 cm JVD, no thyromegally Back:  No CVA tenderness Lungs:  Clear with no wheezes, rales, or rhonchi. HEART:  Regular rate rhythm, no murmurs, no rubs, no clicks Abd:  soft, positive bowel sounds, no organomegally, no rebound, no guarding  Ext:  2 plus pulses, no edema, no cyanosis, no clubbing Skin:  No rashes no nodules Neuro:  CN II through XII intact, motor grossly intact  EKg - normal sinus rhythm with left bundle branch block  Assess/Plan:

## 2013-06-01 NOTE — Discharge Summary (Signed)
ELECTROPHYSIOLOGY PROCEDURE DISCHARGE SUMMARY    Patient ID: Kathryn Dixon,  MRN: [id number], DOB/AGE: 1945-05-28 68 y.o.  Admit date: 06/01/2013 Discharge date: 06/02/2013  Primary Care Cardiologist: Pixie Casino, MD Electrophysiologist: Cristopher Peru, MD  Primary Discharge Diagnosis:  Ischemic cardiomyopathy, LBBB, and congestive heart failure s/p CRTD implantation this admission.   Secondary Discharge Diagnosis:  1.  CAD- s/p CABG 2.  Hypertension 3.  Thoracic aneurysm  No Known Allergies   Procedures This Admission:  1.  Implantation of a Medtronic  CRTD on 06-01-2013 by Dr Lovena Le.  The patient received a Medtronic model number [id number] ICD with model number 5076 right atrial lead, 6935 right ventricular lead, and 4298 left ventricular lead.  DFT's were successful at 46 J.  There were no immediate post procedure complications. 2.  CXR on 06/02/2013 demonstrated no pneumothorax status post device implantation.   Brief HPI: Kathryn Dixon is a 68 y.o. female was referred to electrophysiology in the outpatient setting for consideration of CRTD implantation.  Past medical history includes ischemic cardiomyopathy, congestive heart failure and LBBB.  The patient has persistent LV dysfunction despite guideline directed therapy.  Risks, benefits, and alternatives to ICD implantation were reviewed with the patient who wished to proceed.   Hospital Course:  The patient was admitted and underwent implantation of a CRTD with details as outlined above.   She was monitored on telemetry overnight which demonstrated sinus rhythm with ventricular pacing.  Left chest was without hematoma or ecchymosis.  The device was interrogated and found to be functioning normally.  CXR was obtained and demonstrated no pneumothorax status post device implantation.  Wound care, arm mobility, and restrictions were reviewed with the patient.  Dr Lovena Le examined the patient and considered them stable for discharge to  home.   The patient's discharge medications include an ACE-I (Lisinopril) and beta blocker (Metoprolol).   Discharge Vitals: Blood pressure 103/68, pulse 69, temperature 97.8 F (36.6 C), temperature source Oral, resp. rate 18, height 5\' 4"  (1.626 m), weight 111 lb 14.3 oz (50.755 kg), SpO2 96.00%.   Labs:   Lab Results  Component Value Date   WBC 4.9 06/01/2013   HGB 13.1 06/01/2013   HCT 40.9 06/01/2013   MCV 79.3 06/01/2013   PLT 137* 06/01/2013     Recent Labs Lab 06/01/13 0943  NA 143  K 4.9  CL 104  CO2 27  BUN 24*  CREATININE 0.83  CALCIUM 10.0  GLUCOSE 95   Discharge Medications:    Medication List    ASK your doctor about these medications       aspirin EC 81 MG tablet  Take 81 mg by mouth daily.     hydrochlorothiazide 12.5 MG capsule  Commonly known as:  MICROZIDE  Take 12.5 mg by mouth daily.     lisinopril 5 MG tablet  Commonly known as:  PRINIVIL,ZESTRIL  Take 5 mg by mouth at bedtime.     metoprolol tartrate 25 MG tablet  Commonly known as:  LOPRESSOR  Take 25 mg by mouth 2 (two) times daily.        Disposition:   Future Appointments Provider Department Dept Phone   06/13/2013 10:30 AM [facility] [place] Office [phone]     Follow-up Information   Follow up with [facility] On 06/13/2013. (At 10:30 AM)    Specialty:  Cardiology   Contact information:   [address] [place] [address]  [phone]      Follow up with Cristopher Peru, MD In 3 months. (Our office will call with your appointment date and time)    Specialty:  Cardiology   Contact information:   [address] [place] [address] [phone]       Duration of Discharge Encounter: Greater than 30 minutes including physician time.  Signed,  Mikle Bosworth.D.

## 2013-06-01 NOTE — Interval H&P Note (Signed)
ICD Criteria  Current LVEF (within 6 months):20%  NYHA Functional Classification: Class II  Heart Failure History:  Yes, Duration of heart failure since onset is 3 to 9 months  Non-Ischemic Dilated Cardiomyopathy History:  No.  Atrial Fibrillation/Atrial Flutter:  No.  Ventricular Tachycardia History:  No.  Cardiac Arrest History:  No  History of Syndromes with Risk of Sudden Death:  No.  Previous ICD:  No.  Electrophysiology Study: No.  Prior MI: No.  PPM: No.  OSA:  No  Patient Life Expectancy of >=1 year: Yes.  Anticoagulation Therapy:  Patient is NOT on anticoagulation therapy.   Beta Blocker Therapy:  Yes.   Ace Inhibitor/ARB Therapy:  Yes.History and Physical Interval Note: since prior clinic visit, no change in the history, physical exam, assessment and plan.  06/01/2013 9:53 AM  Kathryn Dixon  has presented today for surgery, with the diagnosis of left bundle/csm  The various methods of treatment have been discussed with the patient and family. After consideration of risks, benefits and other options for treatment, the patient has consented to  Procedure(s): BI-VENTRICULAR IMPLANTABLE CARDIOVERTER DEFIBRILLATOR  (CRT-D) (N/A) as a surgical intervention .  The patient's history has been reviewed, patient examined, no change in status, stable for surgery.  I have reviewed the patient's chart and labs.  Questions were answered to the patient's satisfaction.     Mikle Bosworth.D.

## 2013-06-01 NOTE — Progress Notes (Signed)
Cath lab called for pt to be brought to cath lab, informed that labs pending and was instructed to bring pt over anyway by Santiago Glad per Dr. Lovena Le.

## 2013-06-02 ENCOUNTER — Telehealth: Payer: Self-pay | Admitting: Cardiovascular Disease

## 2013-06-02 ENCOUNTER — Ambulatory Visit (HOSPITAL_COMMUNITY): Payer: Medicare Other

## 2013-06-02 DIAGNOSIS — I2589 Other forms of chronic ischemic heart disease: Secondary | ICD-10-CM

## 2013-06-02 NOTE — Progress Notes (Signed)
Reviewed discharge instructions with patient and she stated her understanding.  Patient stated she understood arm limitation instructions also.  Discharged home with family via wheelchair and volunteers.  Kathryn Dixon

## 2013-06-02 NOTE — Discharge Instructions (Signed)
Supplemental Discharge Instructions for  Pacemaker/Defibrillator Patients  Activity No heavy lifting or vigorous activity with your left/right arm for 6 to 8 weeks.  Do not raise your left/right arm above your head for one week.  Gradually raise your affected arm as drawn below.           01/10                      01/11                        01/12                     01/13       NO DRIVING for 1 week; you may begin driving on 12/26/2334. WOUND CARE   Keep the wound area clean and dry.  Do not get this area wet for one week. No showers for one week; you may shower on 06/10/2013.   The tape/steri-strips on your wound will fall off; do not pull them off.  No bandage is needed on the site.  DO  NOT apply any creams, oils, or ointments to the wound area.   If you notice any drainage or discharge from the wound, any swelling or bruising at the site, or you develop a fever > 101? F after you are discharged home, call the office at once.  Special Instructions   You are still able to use cellular telephones; use the ear opposite the side where you have your pacemaker/defibrillator.  Avoid carrying your cellular phone near your device.   When traveling through airports, show security personnel your identification card to avoid being screened in the metal detectors.  Ask the security personnel to use the hand wand.   Avoid arc welding equipment, MRI testing (magnetic resonance imaging), TENS units (transcutaneous nerve stimulators).  Call the office for questions about other devices.   Avoid electrical appliances that are in poor condition or are not properly grounded.   Microwave ovens are safe to be near or to operate.  Additional information for defibrillator patients should your device go off:   If your device goes off ONCE and you feel fine afterward, notify the device clinic nurses.   If your device goes off ONCE and you do not feel well afterward, call 911.   If your device goes off  TWICE, call 911.   If your device goes off THREE times in one day, call 911.  DO NOT DRIVE YOURSELF OR A FAMILY MEMBER WITH A DEFIBRILLATOR TO THE HOSPITAL--CALL 911.    Cardiomyopathy Cardiomyopathy means a disease of the heart muscle. The heart muscle becomes enlarged or stiff. The heart is not able to pump enough blood or deliver enough oxygen to the body. This leads to heart failure and is the number one reason for heart transplants.  TYPES OF CARDIOMYOPATHY INCLUDE: DILATED  The most common type. The heart muscle is stretched out and weak so there is less blood pumped out.   Some causes:  Disease of the arteries of the heart (ischemia).  Heart attack with muscle scar.  Leaky or damaged valves.  After a viral illness.  Smoking.  High cholesterol.  Diabetes or overactive thyroid.  Alcohol or drug abuse.  High blood pressure.  May be reversible. HYPERTROPHIC The heart muscle grows bigger so there is less room for blood in the ventricle, and not enough blood is pumped out.  Causes include:  Mitral valve leaks.  Inherited tendency (from your family).  No explanation (idiopathic).  May be a cause of sudden death in young athletes with no symptoms. RESTRICTIVE The heart muscle becomes stiff, but not always larger. The heart has to work harder and will get weaker. Abnormal heart beats or rhythm (arrhythmia) are common.  Some causes:  Diseases in other parts of the body which may produce abnormal deposits in the heart muscle.  Probably not inherited.  A result of radiation treatment for cancer. SYMPTOMS OF ALL TYPES:  Less able to exercise or tolerate physical activity.  Palpitations.  Irregular heart beat, heart arrhythmias.  Shortness of breath, even at rest.  Chest pain.  Lightheadedness or fainting. TREATMENT  Life-style changes including reducing salt, lowering cholesterol, stop smoking.  Manage contributing causes with  medications.  Medicines to help reduce the fluids in the body.  An implanted cardioverter defibrillator (ICD) to improve heart function and correct arrhythmias.  Medications to relax the blood vessels and make it easier for the heart to pump.  Drugs that help regulate heart beat and improve heart relaxation, reducing the work of the heart.  Myomectomy for patients with hypertrophic cardiomyopathy and severe problems. This is a surgical procedure that removes a portion of the thickened muscle wall in order to improve heart output and provide symptom relief.  A heart transplant is an option in carefully applied circumstances. SEEK IMMEDIATE MEDICAL CARE IF:   You have severe chest pain, especially if the pain is crushing or pressure-like and spreads to the arms, back, neck, or jaw, or if you have sweating, feeling sick to your stomach (nausea), or shortness of breath. THIS IS AN EMERGENCY. Do not wait to see if the pain will go away. Get medical help at once. Call your local emergency services (911 in [place] DO NOT drive yourself to the hospital.  You develop severe shortness of breath.  You begin to cough up bloody sputum.  You are unable to sleep because you cannot breathe.  You gain weight due to fluid retention.  You develop painful swelling in your calf or leg.  You feel your heart racing and it does not go away or happens when you are resting. Document Released: 07/25/2004 Document Revised: 08/04/2011 Document Reviewed: 12/29/2007 [facility] Patient Information 2014 [place].  Heart Failure Heart failure is a condition in which the heart has trouble pumping blood. This means your heart does not pump blood efficiently for your body to work well. In some cases of heart failure, fluid may back up into your lungs or you may have swelling (edema) in your lower legs. Heart failure is usually a long-term (chronic) condition. It is important for you to take good care of yourself and  follow your caregiver's treatment plan. CAUSES  Some health conditions can cause heart failure. Those health conditions include:  High blood pressure (hypertension) causes the heart muscle to work harder than normal. When pressure in the blood vessels is high, the heart needs to pump (contract) with more force in order to circulate blood throughout the body. High blood pressure eventually causes the heart to become stiff and weak.  Coronary artery disease (CAD) is the buildup of cholesterol and fat (plaque) in the arteries of the heart. The blockage in the arteries deprives the heart muscle of oxygen and blood. This can cause chest pain and may lead to a heart attack. High blood pressure can also contribute to CAD.  Heart attack (myocardial infarction)  occurs when 1 or more arteries in the heart become blocked. The loss of oxygen damages the muscle tissue of the heart. When this happens, part of the heart muscle dies. The injured tissue does not contract as well and weakens the heart's ability to pump blood.  Abnormal heart valves can cause heart failure when the heart valves do not open and close properly. This makes the heart muscle pump harder to keep the blood flowing.  Heart muscle disease (cardiomyopathy or myocarditis) is damage to the heart muscle from a variety of causes. These can include drug or alcohol abuse, infections, or unknown reasons. These can increase the risk of heart failure.  Lung disease makes the heart work harder because the lungs do not work properly. This can cause a strain on the heart, leading it to fail.  Diabetes increases the risk of heart failure. High blood sugar contributes to high fat (lipid) levels in the blood. Diabetes can also cause slow damage to tiny blood vessels that carry important nutrients to the heart muscle. When the heart does not get enough oxygen and food, it can cause the heart to become weak and stiff. This leads to a heart that does not contract  efficiently.  Other conditions can contribute to heart failure. These include abnormal heart rhythms, thyroid problems, and low blood counts (anemia). Certain unhealthy behaviors can increase the risk of heart failure. Those unhealthy behaviors include:  Being overweight.  Smoking or chewing tobacco.  Eating foods high in fat and cholesterol.  Abusing illicit drugs or alcohol.  Lacking physical activity. SYMPTOMS  Heart failure symptoms may vary and can be hard to detect. Symptoms may include:  Shortness of breath with activity, such as climbing stairs.  Persistent cough.  Swelling of the feet, ankles, legs, or abdomen.  Unexplained weight gain.  Difficulty breathing when lying flat (orthopnea).  Waking from sleep because of the need to sit up and get more air.  Rapid heartbeat.  Fatigue and loss of energy.  Feeling lightheaded, dizzy, or close to fainting.  Loss of appetite.  Nausea.  Increased urination during the night (nocturia). DIAGNOSIS  A diagnosis of heart failure is based on your history, symptoms, physical examination, and diagnostic tests. Diagnostic tests for heart failure may include:  Echocardiography.  Electrocardiography.  Chest X-ray.  Blood tests.  Exercise stress test.  Cardiac angiography.  Radionuclide scans. TREATMENT  Treatment is aimed at managing the symptoms of heart failure. Medicines, behavioral changes, or surgical intervention may be necessary to treat heart failure.  Medicines to help treat heart failure may include:  Angiotensin-converting enzyme (ACE) inhibitors. This type of medicine blocks the effects of a blood protein called angiotensin-converting enzyme. ACE inhibitors relax (dilate) the blood vessels and help lower blood pressure.  Angiotensin receptor blockers. This type of medicine blocks the actions of a blood protein called angiotensin. Angiotensin receptor blockers dilate the blood vessels and help lower  blood pressure.  Water pills (diuretics). Diuretics cause the kidneys to remove salt and water from the blood. The extra fluid is removed through urination. This loss of extra fluid lowers the volume of blood the heart pumps.  Beta blockers. These prevent the heart from beating too fast and improve heart muscle strength.  Digitalis. This increases the force of the heartbeat.  Healthy behavior changes include:  Obtaining and maintaining a healthy weight.  Stopping smoking or chewing tobacco.  Eating heart healthy foods.  Limiting or avoiding alcohol.  Stopping illicit drug use.  Physical  activity as directed by your caregiver.  Surgical treatment for heart failure may include:  A procedure to open blocked arteries, repair damaged heart valves, or remove damaged heart muscle tissue.  A pacemaker to improve heart muscle function and control certain abnormal heart rhythms.  An internal cardioverter defibrillator to treat certain serious abnormal heart rhythms.  A left ventricular assist device to assist the pumping ability of the heart. HOME CARE INSTRUCTIONS   Take your medicine as directed by your caregiver. Medicines are important in reducing the workload of your heart, slowing the progression of heart failure, and improving your symptoms.  Do not stop taking your medicine unless directed by your caregiver.  Do not skip any dose of medicine.  Refill your prescriptions before you run out of medicine. Your medicines are needed every day.  Take over-the-counter medicine only as directed by your caregiver or pharmacist.  Engage in moderate physical activity if directed by your caregiver. Moderate physical activity can benefit some people. The elderly and people with severe heart failure should consult with a caregiver for physical activity recommendations.  Eat heart healthy foods. Food choices should be free of trans fat and low in saturated fat, cholesterol, and salt  (sodium). Healthy choices include fresh or frozen fruits and vegetables, fish, lean meats, legumes, fat-free or low-fat dairy products, and whole grain or high fiber foods. Talk to a dietitian to learn more about heart healthy foods.  Limit sodium if directed by your caregiver. Sodium restriction may reduce symptoms of heart failure in some people. Talk to a dietitian to learn more about heart healthy seasonings.  Use healthy cooking methods. Healthy cooking methods include roasting, grilling, broiling, baking, poaching, steaming, or stir-frying. Talk to a dietitian to learn more about healthy cooking methods.  Limit fluids if directed by your caregiver. Fluid restriction may reduce symptoms of heart failure in some people.  Weigh yourself every day. Daily weights are important in the early recognition of excess fluid. You should weigh yourself every morning after you urinate and before you eat breakfast. Wear the same amount of clothing each time you weigh yourself. Record your daily weight. Provide your caregiver with your weight record.  Monitor and record your blood pressure if directed by your caregiver.  Check your pulse if directed by your caregiver.  Lose weight if directed by your caregiver. Weight loss may reduce symptoms of heart failure in some people.  Stop smoking or chewing tobacco. Nicotine makes your heart work harder by causing your blood vessels to constrict. Do not use nicotine gum or patches before talking to your caregiver.  Schedule and attend follow-up visits as directed by your caregiver. It is important to keep all your appointments.  Limit alcohol intake to no more than 1 drink per day for nonpregnant women and 2 drinks per day for men. Drinking more than that is harmful to your heart. Tell your caregiver if you drink alcohol several times a week. Talk with your caregiver about whether alcohol is safe for you. If your heart has already been damaged by alcohol or you  have severe heart failure, drinking alcohol should be stopped completely.  Stop illicit drug use.  Stay up-to-date with immunizations. It is especially important to prevent respiratory infections through current pneumococcal and influenza immunizations.  Manage other health conditions such as hypertension, diabetes, thyroid disease, or abnormal heart rhythms as directed by your caregiver.  Learn to manage stress.  Plan rest periods when fatigued.  Learn strategies to manage  high temperatures. If the weather is extremely hot:  Avoid vigorous physical activity.  Use air conditioning or fans or seek a cooler location.  Avoid caffeine and alcohol.  Wear loose-fitting, lightweight, and light-colored clothing.  Learn strategies to manage cold temperatures. If the weather is extremely cold:  Avoid vigorous physical activity.  Layer clothes.  Wear mittens or gloves, a hat, and a scarf when going outside.  Avoid alcohol.  Obtain ongoing education and support as needed.  Participate or seek rehabilitation as needed to maintain or improve independence and quality of life. SEEK MEDICAL CARE IF:   Your weight increases by 03 lb/1.4 kg in 1 day or 05 lb/2.3 kg in a week.  You have increasing shortness of breath that is unusual for you.  You are unable to participate in your usual physical activities.  You tire easily.  You cough more than normal, especially with physical activity.  You have any or more swelling in areas such as your hands, feet, ankles, or abdomen.  You are unable to sleep because it is hard to breathe.  You feel like your heart is beating fast (palpitations).  You become dizzy or lightheaded upon standing up. SEEK IMMEDIATE MEDICAL CARE IF:   You have difficulty breathing.  There is a change in mental status such as decreased alertness or difficulty with concentration.  You have a pain or discomfort in your chest.  You have an episode of fainting  (syncope). MAKE SURE YOU:   Understand these instructions.  Will watch your condition.  Will get help right away if you are not doing well or get worse. Document Released: 05/12/2005 Document Revised: 09/06/2012 Document Reviewed: 06/03/2012 [facility] Patient Information 2014 [place], [place].

## 2013-06-02 NOTE — Telephone Encounter (Signed)
New message  Patient was discharged from hospital today. She needs to know what strength of Tylenol to take? Please call and advise.

## 2013-06-02 NOTE — Telephone Encounter (Signed)
Advised pt she may take regular Tylenol 325-650mg  as directed. Pt verbalized understanding.

## 2013-06-02 NOTE — Op Note (Signed)
NAMEALVERA, TOURIGNY NO.:  [id number]  MEDICAL RECORD NO.:  [id number]  LOCATION:  [id number]                        FACILITY:  [facility]  PHYSICIAN:  Champ Mungo. Lovena Le, MD    DATE OF BIRTH:  Mar 23, 1946  DATE OF PROCEDURE:  06/01/2013 DATE OF DISCHARGE:                              OPERATIVE REPORT   PROCEDURE PERFORMED:  Insertion of a biventricular ICD.  INDICATION:  Ischemic cardiomyopathy, chronic; class II systolic heart failure; left bundle-branch block QRS duration 160 milliseconds; status post prior MI, ejection fraction 20% by echo obtained 1 month ago with known longstanding left ventricular dysfunction.  DESCRIPTION OF PROCEDURE:  After informed was obtained, the patient was taken to the diagnostic EP lab in a fasting state.  After usual preparation and draping, intravenous fentanyl and midazolam were given for sedation.  A 30 mL of lidocaine was infiltrated into the left infraclavicular region.  A 7-cm incision was carried out over this region and electrocautery was utilized to dissect down to the fascial plane.  The left subclavian vein was then punctured x3.  The Medtronic model W5470784, 58-cm active fixation single coil defibrillation lead, serial #[id number].  The Medtronic model C338645, 45-cm active fixation pacing lead, serial #[id number] was advanced to the right atrium. Mapping was first carried out in the right ventricle.  At the final site, the R-waves measured 28 mV and the pacing impedance was 600 ohms with the lead actively fixed.  Threshold 0.8 V at 0.5 milliseconds, and there was a large injury current with active fixation of the lead.  A 10 V pacing did not stimulate the diaphragm.  With the ventricular lead in satisfactory position, attention was then turned to placement of the atrial lead which was placed in the anterolateral portion of the right atrium.  The P-waves measured 2.5 mV and with the lead actively fixed, the pacing impedance was  584 ohms.  The pacing threshold was 0.6 V at 0.5 milliseconds.  A 10 V pacing did not stimulate the diaphragm.  With these satisfactory parameters, attention was then turned to placement of the left ventricular lead.  A coronary sinus guiding catheter was advanced by way of the left subclavian vein along with a 6-French hexapolar EP catheter into the right atrium.  The coronary sinus was cannulated.  Venography of the coronary sinus was carried out demonstrating a very large high lateral vein and smaller posterior and posterolateral veins.  The high lateral vein was chosen for LV lead placement.  A Mailman 0.014 angioplasty guidewire was advanced into the lateral vein of the left ventricle and the Medtronic model 4298, 88-cm quadripolar pacing lead, serial #[id number] was advanced into the lateral wall of the left ventricle.  In this location, from apex to base, the pacing threshold was initially elevated.  There were no thresholds below a V.  There were several thresholds above 5 V, but at the final site, the LV threshold was around 1.5 V at 0.8 milliseconds. The lead was liberated from the guiding catheter in the usual manner and the LV lead, defibrillator lead, and atrial leads were secured to the subpectoral fascia with a figure-of-eight silk suture.  A sewing sleeve was also secured with silk suture.  Electrocautery was then utilized to make a subcutaneous pocket.  Antibiotic irrigation was utilized to irrigate the pocket.  Electrocautery was utilized to assure hemostasis. The Medtronic Viva Quad XT CRT-D biventricular ICD, serial #[id number] was connected to the atrial RV and LV leads and placed back in the subcutaneous pocket.  The pocket was irrigated with additional antibiotic irrigation, and the incision was closed with 2-0 and 3-0 Vicryl.  At this point, I scrubbed out of the case to supervise defibrillation threshold testing.  After the patient was more deeply sedated  with fentanyl and Versed under my direct supervision, ventricular fibrillation was induced with a T- wave shock.  A 20-joule shock was delivered which terminated ventricular fibrillation and restored sinus rhythm.  No additional defibrillation threshold testing was carried out, and the patient was returned to the recovery area in satisfactory condition.  COMPLICATIONS:  There were no immediate procedure complications.  RESULTS:  This demonstrates successful implantation of a Medtronic biventricular ICD in a patient with an ischemic cardiomyopathy, chronic systolic heart failure class II, left bundle-branch block with a QRS duration of 160 milliseconds, and an ejection fraction of 20% by echo, 4 months out from prior coronary artery bypass grafting.     Champ Mungo. Lovena Le, MD     GWT/MEDQ  D:  06/01/2013  T:  06/02/2013  Job:  [id number]  cc:   Rolland Porter, MD Erlene Quan, P.A. Mali Hilty, MD

## 2013-06-13 ENCOUNTER — Ambulatory Visit (INDEPENDENT_AMBULATORY_CARE_PROVIDER_SITE_OTHER): Payer: Medicare Other | Admitting: *Deleted

## 2013-06-13 DIAGNOSIS — I255 Ischemic cardiomyopathy: Secondary | ICD-10-CM

## 2013-06-13 DIAGNOSIS — I5022 Chronic systolic (congestive) heart failure: Secondary | ICD-10-CM

## 2013-06-13 DIAGNOSIS — I2589 Other forms of chronic ischemic heart disease: Secondary | ICD-10-CM

## 2013-06-13 LAB — MDC_IDC_ENUM_SESS_TYPE_INCLINIC
Battery Remaining Longevity: 77 mo
Battery Voltage: 3.06 V
Brady Statistic AP VP Percent: 1.8 %
Brady Statistic AP VS Percent: 0.04 %
Brady Statistic AS VP Percent: 96.28 %
Brady Statistic AS VS Percent: 1.88 %
Date Time Interrogation Session: 20150119110011
HIGH POWER IMPEDANCE MEASURED VALUE: 209 Ohm
HighPow Impedance: 50 Ohm
Lead Channel Impedance Value: 494 Ohm
Lead Channel Pacing Threshold Amplitude: 0.75 V
Lead Channel Pacing Threshold Amplitude: 1 V
Lead Channel Pacing Threshold Amplitude: 1.75 V
Lead Channel Pacing Threshold Pulse Width: 0.4 ms
Lead Channel Pacing Threshold Pulse Width: 0.4 ms
Lead Channel Pacing Threshold Pulse Width: 0.8 ms
Lead Channel Sensing Intrinsic Amplitude: 2.25 mV
Lead Channel Setting Pacing Amplitude: 2.75 V
Lead Channel Setting Pacing Pulse Width: 0.4 ms
Lead Channel Setting Sensing Sensitivity: 0.3 mV
MDC IDC MSMT LEADCHNL RV IMPEDANCE VALUE: 399 Ohm
MDC IDC MSMT LEADCHNL RV SENSING INTR AMPL: 27.25 mV
MDC IDC SET LEADCHNL LV PACING PULSEWIDTH: 0.8 ms
MDC IDC SET LEADCHNL RA PACING AMPLITUDE: 3.5 V
MDC IDC SET LEADCHNL RV PACING AMPLITUDE: 3.5 V
MDC IDC SET ZONE DETECTION INTERVAL: 340 ms
MDC IDC STAT BRADY RA PERCENT PACED: 1.84 %
MDC IDC STAT BRADY RV PERCENT PACED: 31.06 %
Zone Setting Detection Interval: 300 ms
Zone Setting Detection Interval: 350 ms
Zone Setting Detection Interval: 450 ms

## 2013-06-13 NOTE — Progress Notes (Signed)

## 2013-06-24 ENCOUNTER — Encounter: Payer: Self-pay | Admitting: Internal Medicine

## 2013-09-09 ENCOUNTER — Emergency Department (HOSPITAL_COMMUNITY): Payer: Medicare Other

## 2013-09-09 ENCOUNTER — Encounter (HOSPITAL_COMMUNITY): Payer: Self-pay | Admitting: Emergency Medicine

## 2013-09-09 ENCOUNTER — Emergency Department (HOSPITAL_COMMUNITY)
Admission: EM | Admit: 2013-09-09 | Discharge: 2013-09-09 | Disposition: A | Discharge status: Home / Self Care | Payer: Medicare Other | Attending: Emergency Medicine | Admitting: Emergency Medicine

## 2013-09-09 DIAGNOSIS — I10 Essential (primary) hypertension: Secondary | ICD-10-CM | POA: Insufficient documentation

## 2013-09-09 DIAGNOSIS — Z7982 Long term (current) use of aspirin: Secondary | ICD-10-CM | POA: Insufficient documentation

## 2013-09-09 DIAGNOSIS — S82409A Unspecified fracture of shaft of unspecified fibula, initial encounter for closed fracture: Principal | ICD-10-CM

## 2013-09-09 DIAGNOSIS — F17200 Nicotine dependence, unspecified, uncomplicated: Secondary | ICD-10-CM | POA: Insufficient documentation

## 2013-09-09 DIAGNOSIS — I2510 Atherosclerotic heart disease of native coronary artery without angina pectoris: Secondary | ICD-10-CM | POA: Insufficient documentation

## 2013-09-09 DIAGNOSIS — Z951 Presence of aortocoronary bypass graft: Secondary | ICD-10-CM | POA: Insufficient documentation

## 2013-09-09 DIAGNOSIS — Y929 Unspecified place or not applicable: Secondary | ICD-10-CM | POA: Insufficient documentation

## 2013-09-09 DIAGNOSIS — Z79899 Other long term (current) drug therapy: Secondary | ICD-10-CM | POA: Insufficient documentation

## 2013-09-09 DIAGNOSIS — Y9389 Activity, other specified: Secondary | ICD-10-CM | POA: Insufficient documentation

## 2013-09-09 DIAGNOSIS — S82209A Unspecified fracture of shaft of unspecified tibia, initial encounter for closed fracture: Secondary | ICD-10-CM | POA: Insufficient documentation

## 2013-09-09 DIAGNOSIS — W010XXA Fall on same level from slipping, tripping and stumbling without subsequent striking against object, initial encounter: Secondary | ICD-10-CM | POA: Insufficient documentation

## 2013-09-09 DIAGNOSIS — I252 Old myocardial infarction: Secondary | ICD-10-CM | POA: Insufficient documentation

## 2013-09-09 LAB — COMPREHENSIVE METABOLIC PANEL
ALBUMIN: 4 g/dL (ref 3.5–5.2)
ALT: 14 U/L (ref 0–35)
AST: 36 U/L (ref 0–37)
Alkaline Phosphatase: 150 U/L — ABNORMAL HIGH (ref 39–117)
BILIRUBIN TOTAL: 0.6 mg/dL (ref 0.3–1.2)
BUN: 20 mg/dL (ref 6–23)
CALCIUM: 10.6 mg/dL — AB (ref 8.4–10.5)
CHLORIDE: 102 meq/L (ref 96–112)
CO2: 23 mEq/L (ref 19–32)
CREATININE: 0.73 mg/dL (ref 0.50–1.10)
GFR calc Af Amer: >90 mL/min (ref >90)
GFR calc non Af Amer: 86 mL/min — ABNORMAL LOW (ref >90)
Glucose, Bld: 120 mg/dL — ABNORMAL HIGH (ref 70–99)
Potassium: 4.2 mEq/L (ref 3.7–5.3)
Sodium: 143 mEq/L (ref 137–147)
TOTAL PROTEIN: 8.7 g/dL — AB (ref 6.0–8.3)

## 2013-09-09 LAB — CBC WITH DIFFERENTIAL/PLATELET
Basophils Absolute: 0 10*3/uL (ref 0.0–0.1)
Basophils Relative: 0 % (ref 0–1)
EOS ABS: 0 10*3/uL (ref 0.0–0.7)
EOS PCT: 0 % (ref 0–5)
HEMATOCRIT: 44.1 % (ref 36.0–46.0)
HEMOGLOBIN: 14.6 g/dL (ref 12.0–15.0)
LYMPHS ABS: 1.2 10*3/uL (ref 0.7–4.0)
Lymphocytes Relative: 15 % (ref 12–46)
MCH: 26.2 pg (ref 26.0–34.0)
MCHC: 33.1 g/dL (ref 30.0–36.0)
MCV: 79.2 fL (ref 78.0–100.0)
MONO ABS: 0.7 10*3/uL (ref 0.1–1.0)
MONOS PCT: 9 % (ref 3–12)
Neutro Abs: 6.3 10*3/uL (ref 1.7–7.7)
Neutrophils Relative %: 76 % (ref 43–77)
Platelets: 156 10*3/uL (ref 150–400)
RBC: 5.57 MIL/uL — AB (ref 3.87–5.11)
RDW: 18.3 % — ABNORMAL HIGH (ref 11.5–15.5)
WBC: 8.3 10*3/uL (ref 4.0–10.5)

## 2013-09-09 MED ORDER — HYDROCODONE-ACETAMINOPHEN 5-325 MG PO TABS: 1.0000 | ORAL_TABLET | ORAL | Status: DC | PRN

## 2013-09-09 MED ORDER — SODIUM CHLORIDE 0.9 % IV SOLN
Freq: Once | INTRAVENOUS | Status: AC
  Administered 2013-09-09: 14:00:00 via INTRAVENOUS

## 2013-09-09 MED ORDER — ETOMIDATE 2 MG/ML IV SOLN
INTRAVENOUS | Status: AC | PRN
  Administered 2013-09-09: 15 mg via INTRAVENOUS

## 2013-09-09 MED ORDER — MORPHINE SULFATE 4 MG/ML IJ SOLN
4.0000 mg | Freq: Once | INTRAMUSCULAR | Status: AC
  Administered 2013-09-09: 4 mg via INTRAVENOUS
  Filled 2013-09-09: qty 1

## 2013-09-09 MED ORDER — ETOMIDATE 2 MG/ML IV SOLN: 0.3000 mg/kg | Freq: Once | INTRAVENOUS | Status: DC

## 2013-09-09 MED ORDER — HYDROCODONE-ACETAMINOPHEN 5-325 MG PO TABS
2.0000 | ORAL_TABLET | Freq: Once | ORAL | Status: AC
  Administered 2013-09-09: 2 via ORAL
  Filled 2013-09-09: qty 2

## 2013-09-09 MED ORDER — ETOMIDATE 2 MG/ML IV SOLN
INTRAVENOUS | Status: AC
  Filled 2013-09-09: qty 10

## 2013-09-09 NOTE — ED Notes (Signed)
Staff assisted pt with callin her son again. He did not realize pt ready for discharge. He is on the way

## 2013-09-09 NOTE — Consult Note (Signed)
Brief Interventional Radiology Consult Note  Date: 09/09/13 Referring Physician: Daleen Bo, MD  Chief Complaint: Left ankle fracture, unable to detect pulse  HPI:  68 yo female sustained an accidental mechanical fall yesterday. She was brought today to the ER and found to have a comminuted distal tibia and fibular fracture.  A pulse was not detectable prior to reduction. The deformity was reduced and alignment restored by the ED staff and a splint is applied. Pulse still not detected.   Pain improving.  Denies progress pain, paresthesia, or loss of sensation.  Injury occurred yesterday.  Physical Exam: Left foot warm to the touch.  Trophic nail and skin changes.  Sensation intact.  Bilateral CFA barely 1+ palpable Right AT biphasic doppler +; Right PT monophasic doppler + Left AT monophasic doppler+  Assessment/Plan:  Pt appears to have moderate to advanced background occlusive PAD.  A doppler + but monophasic anterior tibial artery pulse is detected in the injured leg.  The foot is warm and sensation remains intact.  No evidence of arterial injury.  Pulse likely difficult to detect 2/2 to underlying PAD.   Signed,  Criselda Peaches, MD [facility]

## 2013-09-09 NOTE — Sedation Documentation (Signed)
Code cart, airway cart, oxygen set up at bedside.

## 2013-09-09 NOTE — ED Notes (Signed)
Pt awake and alert. Sipping po fluids

## 2013-09-09 NOTE — ED Notes (Signed)
When rolling pt back to FT room and pt attempted to move leg noticed that when pt lifts leg that her foot hangs and she is unable to flex foot. Increased acuity and Sheri PA assessed pt.

## 2013-09-09 NOTE — ED Notes (Signed)
Attempted X 2 to insert Iv , by Cecille Rubin, RN and Levada Dy, RN unsuccessful, sites intact no bleeding dressing applied. Spoke with the IV nurse.

## 2013-09-09 NOTE — ED Provider Notes (Signed)
  Face-to-face evaluation   History: She fell yesterday injuring her left lower leg. She does not know why she fell   Physical exam: Exam alert, calm, cooperative. She has moderate tenderness of the left lower leg with left ankle swelling. There is no palpable pulse in the left DP or PT, locations.   The left foot is warm to touch, it is sensate, and she moves the toes normally. Doppler interrogation of the DP and PT location reveals monophasic pulse.  No skin defect at fracture site.  Procedural sedation Performed by: Richarda Blade Consent: Verbal consent obtained. Risks and benefits: risks, benefits and alternatives were discussed Required items: required blood products, implants, devices, and special equipment available Patient identity confirmed: arm band and provided demographic data Time out: Immediately prior to procedure a "time out" was called to verify the correct patient, procedure, equipment, support staff and site/side marked as required.  Sedation type: moderate (conscious) sedation NPO time confirmed and considedered  Sedatives: ETOMIDATE  Physician Time at Bedside: 25 minutes  Vitals: Vital signs were monitored during sedation. Cardiac Monitor, pulse oximeter Patient tolerance: Patient tolerated the procedure well with no immediate complications. Comments: Pt with uneventful recovered. Returned to pre-procedural sedation baseline  Reduction of dislocation Date/Time: 4:44 PM Performed by: Richarda Blade Authorized by: Richarda Blade Consent: Verbal consent obtained. Risks and benefits: risks, benefits and alternatives were discussed Consent given by: patient Required items: required blood products, implants, devices, and special equipment available Time out: Immediately prior to procedure a "time out" was called to verify the correct patient, procedure, equipment, support staff and site/side marked as required.  Patient sedated: with Etomidate  Vitals: Vital  signs were monitored during sedation. Patient tolerance: Patient tolerated the procedure well with no immediate complications. Left Lower Leg Fracture Reduction technique: Lengthing and realignment  16:55- diligent asessment for pulse with Doppler, pre-and post reduction, did not reveal a pulse in DP or PT of left foot. We discussed the case with orthopedics, Dr. Ninfa Linden who will be on board as orthopedic consultation for the fracture. I discussed the case with Dr. Laurence Ferrari, from interventional radiology, who will assist the patient in the ED, for an arterial injury. Disposition, will occur after further assessment, and treatment as needed.  I discussed the case with on-call IR- Dr. Laurence Ferrari, came here and evaluated the patient. He felt that she had a pulse obtained by Doppler and there was no acute vascular compromise.     Medical screening examination/treatment/procedure(s) were conducted as a shared visit with non-physician practitioner(s) and myself.  I personally evaluated the patient during the encounter  Richarda Blade, MD 09/09/13 [phone]

## 2013-09-09 NOTE — Consult Note (Signed)
Reason for Consult:  Left distal tibia/fibula fracture Referring Physician:   ED MD  Kathryn Dixon is an 68 y.o. female.  HPI:   68 yo female sustained an accidental mechanical fall yesterday.  She developed severe left leg pain and the inability to ambulate.  She was brought today to the ER and found to have a deformity of her left distal tibia.  The deformity was fabulously reduced and alignment restored by the ED staff and a splint is applied.  She currently reports less pain in her left leg and normal foot sensation.  Again, the injury occurred yesterday.  She does have a significant cardia history and an ICD was placed in just January of this year.  Past Medical History  Diagnosis Date  . Hypertension   . Coronary artery disease   . LBBB (left bundle branch block)   . Ischemic cardiomyopathy   . Thoracic aneurysm without mention of rupture   . Smoker   . S/P CABG x 2 - LIMA-LAD, SVG-OM, 01/27/13 01/28/2013  . Myocardial infarction 1990's;01/2013    ; "mild, right before OHS"  . Exertional shortness of breath     Past Surgical History  Procedure Laterality Date  . Abdominal hysterectomy    . Fracture surgery    . Coronary artery bypass graft N/A 01/27/2013    Procedure: CORONARY ARTERY BYPASS GRAFTING times two using Right Greater Saphenous Vein Graft [place]sted Endoscopically and Left Internal Mammary Artery;  Surgeon: Gaye Pollack, MD;  Location: [facility];  Service: Open Heart Surgery;  Laterality: N/A;  . Intraoperative transesophageal echocardiogram N/A 01/27/2013    Procedure: INTRAOPERATIVE TRANSESOPHAGEAL ECHOCARDIOGRAM;  Surgeon: Gaye Pollack, MD;  Location: [facility] OR;  Service: Open Heart Surgery;  Laterality: N/A;  . Dilation and curettage of uterus    . Tubal ligation      History reviewed. No pertinent family history.  Social History:  reports that she has been smoking Cigarettes.  She has a 6 pack-year smoking history. She has never used smokeless tobacco. She reports that she  drinks alcohol. She reports that she does not use illicit drugs.  Allergies: No Known Allergies  Medications: I have reviewed the patient's current medications.  Results for orders placed during the hospital encounter of 09/09/13 (from the past 48 hour(s))  CBC WITH DIFFERENTIAL     Status: Abnormal   Collection Time    09/09/13 12:18 PM      Result Value Ref Range   WBC 8.3  4.0 - 10.5 K/uL   RBC 5.57 (*) 3.87 - 5.11 MIL/uL   Hemoglobin 14.6  12.0 - 15.0 g/dL   HCT 44.1  36.0 - 46.0 %   MCV 79.2  78.0 - 100.0 fL   MCH 26.2  26.0 - 34.0 pg   MCHC 33.1  30.0 - 36.0 g/dL   RDW 18.3 (*) 11.5 - 15.5 %   Platelets 156  150 - 400 K/uL   Neutrophils Relative % 76  43 - 77 %   Neutro Abs 6.3  1.7 - 7.7 K/uL   Lymphocytes Relative 15  12 - 46 %   Lymphs Abs 1.2  0.7 - 4.0 K/uL   Monocytes Relative 9  3 - 12 %   Monocytes Absolute 0.7  0.1 - 1.0 K/uL   Eosinophils Relative 0  0 - 5 %   Eosinophils Absolute 0.0  0.0 - 0.7 K/uL   Basophils Relative 0  0 - 1 %   Basophils Absolute  0.0  0.0 - 0.1 K/uL  COMPREHENSIVE METABOLIC PANEL     Status: Abnormal   Collection Time    09/09/13 12:18 PM      Result Value Ref Range   Sodium 143  137 - 147 mEq/L   Potassium 4.2  3.7 - 5.3 mEq/L   Chloride 102  96 - 112 mEq/L   CO2 23  19 - 32 mEq/L   Glucose, Bld 120 (*) 70 - 99 mg/dL   BUN 20  6 - 23 mg/dL   Creatinine, Ser 0.73  0.50 - 1.10 mg/dL   Calcium 10.6 (*) 8.4 - 10.5 mg/dL   Total Protein 8.7 (*) 6.0 - 8.3 g/dL   Albumin 4.0  3.5 - 5.2 g/dL   AST 36  0 - 37 U/L   Comment: HEMOLYSIS AT THIS LEVEL MAY AFFECT RESULT   ALT 14  0 - 35 U/L   Alkaline Phosphatase 150 (*) 39 - 117 U/L   Total Bilirubin 0.6  0.3 - 1.2 mg/dL   GFR calc non Af Amer 86 (*) >90 mL/min   GFR calc Af Amer >90  >90 mL/min   Comment: (NOTE)     The eGFR has been calculated using the CKD EPI equation.     This calculation has not been validated in all clinical situations.     eGFR's persistently <90 mL/min signify  possible Chronic Kidney     Disease.    Dg Tibia/fibula Left  09/09/2013   CLINICAL DATA:  [place], pain  EXAM: LEFT TIBIA AND FIBULA - 2 VIEW  COMPARISON:  None.  FINDINGS: There is a comminuted distal tibia and fibular fracture with soft tissue swelling and slight impaction. The ankle joint is anteriorly displaced with regard to the proximal tibia and fibula. Skeletal osteopenia is noted.  IMPRESSION: Comminuted distal tibia and fibular fractures with slight impaction and anterior angulation of the distal moiety.   Electronically Signed   By: Rolla Flatten M.D.   On: 09/09/2013 13:14   Dg Tibia/fibula Left Port  09/09/2013   CLINICAL DATA:  Left leg fractures.  Status postreduction.  EXAM: PORTABLE LEFT TIBIA AND FIBULA - 2 VIEW  COMPARISON:  Prior today  FINDINGS: Comminuted fractures of the distal tibia and fibula are now in near anatomic alignment. A splint has been applied.  IMPRESSION: Distal tibia and fibular fractures now in near anatomic alignment.   Electronically Signed   By: Earle Gell M.D.   On: 09/09/2013 17:14    ROS Blood pressure 171/86, pulse 94, temperature 99 F (37.2 C), temperature source Oral, resp. rate 18, weight 57.607 kg (127 lb), SpO2 100.00%. Physical Exam  Musculoskeletal:       Legs:      Feet:    Assessment/Plan: Left distal third tibia/fibula fracture 1)  Given her very osteopenic bone quality and the significantly distal nature of the fracture, she makes for a very difficult surgical candidate.  The fracture is too distal for an IM rod/nail, so the only other surgical option is plates and screws.  Due to her swelling, I would not recommend surgery for at least 4-5 days and potentially even a week.  Currently, she is in great alignment in her splint and comfortable. She will need to remain non-weight bearing on that left leg for a minimum of 6-8 weeks with or without surgery given her bone quality.  Physical therapy would be helpful for mobility purposes with  either being up with a walker only  to just pivoting to a wheelchair.  In a few days, if the swelling has subsided, a regular cast may be an option.  So far, she does not appear to have a vascular injury from my staddpoint given her warm foot and no significant signs of compartment syndrome.  If surgery is eventually recommended, she will need cardiac clearance before even proceeding.  If she is admitted medically, I will re-assess her thru out the weekend.  She should definitely remain non-weight bearing on her left leg.  Mcarthur Rossetti 09/09/2013, 5:26 PM

## 2013-09-09 NOTE — ED Notes (Addendum)
Per pt sts she was messing with her radio yesterday and tripped and fell and injured LLE. No obvious deformity noted. Pt no other complaints. Denies hitting head or anything just sts hit her leg.

## 2013-09-09 NOTE — Progress Notes (Signed)
Orthopedic Tech Progress Note Patient Details:  Kathryn Dixon April 24, 1946 [id number]  Ortho Devices Type of Ortho Device: Ace wrap;Post (short leg) splint Ortho Device/Splint Location: lle Ortho Device/Splint Interventions: Application Left tib fib reduction  Branda Chaudhary 09/09/2013, 4:39 PM

## 2013-09-09 NOTE — Sedation Documentation (Signed)
Dr. Eulis Foster attempting reduction of leg.

## 2013-09-09 NOTE — Discharge Instructions (Signed)
YOU MUST NOT BEAR ANY WEIGHT ON THE INJURED LEG. ICE AND ELEVATE TO REDUCE SWELLING. USE THE WALKER AND FAMILY TO ASSIST WITH ANY WALKING. TAKE MEDICATION FOR PAIN AS NEEDED. YOU WILL ALSO NEED TO BE SEEN BY YOUR HEART DOCTOR TO MAKE SURE THAT SURGERY BY THE ORTHOPEDIST WILL BE SAFE.   Cast or Splint Care Casts and splints support injured limbs and keep bones from moving while they heal. It is important to care for your cast or splint at home.  HOME CARE INSTRUCTIONS  Keep the cast or splint uncovered during the drying period. It can take 24 to 48 hours to dry if it is made of plaster. A fiberglass cast will dry in less than 1 hour.  Do not rest the cast on anything harder than a pillow for the first 24 hours.  Do not put weight on your injured limb or apply pressure to the cast until your health care provider gives you permission.  Keep the cast or splint dry. Wet casts or splints can lose their shape and may not support the limb as well. A wet cast that has lost its shape can also create harmful pressure on your skin when it dries. Also, wet skin can become infected.  Cover the cast or splint with a plastic bag when bathing or when out in the rain or snow. If the cast is on the trunk of the body, take sponge baths until the cast is removed.  If your cast does become wet, dry it with a towel or a blow dryer on the cool setting only.  Keep your cast or splint clean. Soiled casts may be wiped with a moistened cloth.  Do not place any hard or soft foreign objects under your cast or splint, such as cotton, toilet paper, lotion, or powder.  Do not try to scratch the skin under the cast with any object. The object could get stuck inside the cast. Also, scratching could lead to an infection. If itching is a problem, use a blow dryer on a cool setting to relieve discomfort.  Do not trim or cut your cast or remove padding from inside of it.  Exercise all joints next to the injury that are not  immobilized by the cast or splint. For example, if you have a long leg cast, exercise the hip joint and toes. If you have an arm cast or splint, exercise the shoulder, elbow, thumb, and fingers.  Elevate your injured arm or leg on 1 or 2 pillows for the first 1 to 3 days to decrease swelling and pain.It is best if you can comfortably elevate your cast so it is higher than your heart. SEEK MEDICAL CARE IF:   Your cast or splint cracks.  Your cast or splint is too tight or too loose.  You have unbearable itching inside the cast.  Your cast becomes wet or develops a soft spot or area.  You have a bad smell coming from inside your cast.  You get an object stuck under your cast.  Your skin around the cast becomes red or raw.  You have new pain or worsening pain after the cast has been applied. SEEK IMMEDIATE MEDICAL CARE IF:   You have fluid leaking through the cast.  You are unable to move your fingers or toes.  You have discolored (blue or white), cool, painful, or very swollen fingers or toes beyond the cast.  You have tingling or numbness around the injured area.  You  have severe pain or pressure under the cast.  You have any difficulty with your breathing or have shortness of breath.  You have chest pain. Document Released: 05/09/2000 Document Revised: 03/02/2013 Document Reviewed: 11/18/2012 [facility] Patient Information 2014 [place]. Cryotherapy Cryotherapy means treatment with cold. Ice or gel packs can be used to reduce both pain and swelling. Ice is the most helpful within the first 24 to 48 hours after an injury or flareup from overusing a muscle or joint. Sprains, strains, spasms, burning pain, shooting pain, and aches can all be eased with ice. Ice can also be used when recovering from surgery. Ice is effective, has very few side effects, and is safe for most people to use. PRECAUTIONS  Ice is not a safe treatment option for people with:  Raynaud's  phenomenon. This is a condition affecting small blood vessels in the extremities. Exposure to cold may cause your problems to return.  Cold hypersensitivity. There are many forms of cold hypersensitivity, including:  Cold urticaria. Red, itchy hives appear on the skin when the tissues begin to warm after being iced.  Cold erythema. This is a red, itchy rash caused by exposure to cold.  Cold hemoglobinuria. Red blood cells break down when the tissues begin to warm after being iced. The hemoglobin that carry oxygen are passed into the urine because they cannot combine with blood proteins fast enough.  Numbness or altered sensitivity in the area being iced. If you have any of the following conditions, do not use ice until you have discussed cryotherapy with your caregiver:  Heart conditions, such as arrhythmia, angina, or chronic heart disease.  High blood pressure.  Healing wounds or open skin in the area being iced.  Current infections.  Rheumatoid arthritis.  Poor circulation.  Diabetes. Ice slows the blood flow in the region it is applied. This is beneficial when trying to stop inflamed tissues from spreading irritating chemicals to surrounding tissues. However, if you expose your skin to cold temperatures for too long or without the proper protection, you can damage your skin or nerves. Watch for signs of skin damage due to cold. HOME CARE INSTRUCTIONS Follow these tips to use ice and cold packs safely.  Place a dry or damp towel between the ice and skin. A damp towel will cool the skin more quickly, so you may need to shorten the time that the ice is used.  For a more rapid response, add gentle compression to the ice.  Ice for no more than 10 to 20 minutes at a time. The bonier the area you are icing, the less time it will take to get the benefits of ice.  Check your skin after 5 minutes to make sure there are no signs of a poor response to cold or skin damage.  Rest 20  minutes or more in between uses.  Once your skin is numb, you can end your treatment. You can test numbness by very lightly touching your skin. The touch should be so light that you do not see the skin dimple from the pressure of your fingertip. When using ice, most people will feel these normal sensations in this order: cold, burning, aching, and numbness.  Do not use ice on someone who cannot communicate their responses to pain, such as small children or people with dementia. HOW TO MAKE AN ICE PACK Ice packs are the most common way to use ice therapy. Other methods include ice massage, ice baths, and cryo-sprays. Muscle creams that  cause a cold, tingly feeling do not offer the same benefits that ice offers and should not be used as a substitute unless recommended by your caregiver. To make an ice pack, do one of the following:  Place crushed ice or a bag of frozen vegetables in a sealable plastic bag. Squeeze out the excess air. Place this bag inside another plastic bag. Slide the bag into a pillowcase or place a damp towel between your skin and the bag.  Mix 3 parts water with 1 part rubbing alcohol. Freeze the mixture in a sealable plastic bag. When you remove the mixture from the freezer, it will be slushy. Squeeze out the excess air. Place this bag inside another plastic bag. Slide the bag into a pillowcase or place a damp towel between your skin and the bag. SEEK MEDICAL CARE IF:  You develop white spots on your skin. This may give the skin a blotchy (mottled) appearance.  Your skin turns blue or pale.  Your skin becomes waxy or hard.  Your swelling gets worse. MAKE SURE YOU:   Understand these instructions.  Will watch your condition.  Will get help right away if you are not doing well or get worse. Document Released: 01/06/2011 Document Revised: 08/04/2011 Document Reviewed: 01/06/2011 [facility] Patient Information 2014 [place], [place]. Tibial Fracture, Adult You have a  fracture (break in bone) of your tibia. This is the large "shin" bone in your lower leg. These fractures are easily diagnosed with x-rays. TREATMENT  You have a simple fracture which usually will heal without disability. It can be treated with simple immobilization. This means the bone can be held with a cast or splint in a favorable position until your caregiver feels it is stable (healed well enough). Then you can begin range of motion exercises to keep your knee and ankle limber (moving well). HOME CARE INSTRUCTIONS   Apply ice to the injury for 15-20 minutes, 03-04 times per day while awake, for 2 days. Put the ice in a plastic bag and place a thin towel between the bag of ice and your cast.  If you have a plaster or fiberglass cast:  Do not try to scratch the skin under the cast using sharp or pointed objects.  Check the skin around the cast every day. You may put lotion on any red or sore areas.  Keep your cast dry and clean.  If you have a plaster splint:  Wear the splint as directed.  You may loosen the elastic around the splint if your toes become numb, tingle, or turn cold or blue.  Do not put pressure on any part of your cast or splint until it is fully hardened.  Your cast or splint can be protected during bathing with a plastic bag. Do not lower the cast or splint into water.  Use crutches as directed.  Only take over-the-counter or prescription medicines for pain, discomfort, or fever as directed by your caregiver.  See your caregiver as directed. It is very important to keep all follow-up referrals and appointments in order to avoid any long-term problems with your leg and ankle including chronic pain, inability to move the ankle normally, failure of the fracture to heal and permanent disability. SEEK IMMEDIATE MEDICAL CARE IF:   Pain is becoming worse rather than better, or if pain is uncontrolled with medications.  You have increased swelling, pain, or redness in  the foot.  You begin to lose feeling in your foot or toes.  You develop  a cold or blue foot or toes on the injured side.  You develop severe pain in your injured leg, especially if it is increased with movement of your toes. Document Released: 02/04/2001 Document Revised: 08/04/2011 Document Reviewed: 08/29/2008 [facility] Patient Information 2014 [place], [place].

## 2013-09-09 NOTE — ED Provider Notes (Signed)
CSN: [id number]     Arrival date & time 09/09/13  1121 History   None    Chief Complaint  Patient presents with  . Leg Injury     (Consider location/radiation/quality/duration/timing/severity/associated sxs/prior Treatment) Patient is a 68 y.o. female presenting with leg pain. The history is provided by the patient. No language interpreter was used.  Leg Pain Location:  Leg Leg location:  L lower leg Pain details:    Quality:  Sharp and aching   Radiates to:  Does not radiate   Severity:  Severe   Onset quality:  Sudden Chronicity:  New Associated symptoms: no fever   Associated symptoms comment:  She tripped over a yard tool and fell injuring her left lower leg. Unable to ambulate on that leg since due to pain in the distal lower leg. No hip pain, head injury, neck/chest/abdominal pain or injury.    Past Medical History  Diagnosis Date  . Hypertension   . Coronary artery disease   . LBBB (left bundle branch block)   . Ischemic cardiomyopathy   . Thoracic aneurysm without mention of rupture   . Smoker   . S/P CABG x 2 - LIMA-LAD, SVG-OM, 01/27/13 01/28/2013  . Myocardial infarction 1990's;01/2013    ; "mild, right before OHS"  . Exertional shortness of breath    Past Surgical History  Procedure Laterality Date  . Abdominal hysterectomy    . Fracture surgery    . Coronary artery bypass graft N/A 01/27/2013    Procedure: CORONARY ARTERY BYPASS GRAFTING times two using Right Greater Saphenous Vein Graft Harvsted Endoscopically and Left Internal Mammary Artery;  Surgeon: Gaye Pollack, MD;  Location: [facility] OR;  Service: Open Heart Surgery;  Laterality: N/A;  . Intraoperative transesophageal echocardiogram N/A 01/27/2013    Procedure: INTRAOPERATIVE TRANSESOPHAGEAL ECHOCARDIOGRAM;  Surgeon: Gaye Pollack, MD;  Location: [facility] OR;  Service: Open Heart Surgery;  Laterality: N/A;  . Dilation and curettage of uterus    . Tubal ligation     History reviewed. No pertinent family  history. History  Substance Use Topics  . Smoking status: Current Some Day Smoker -- 0.12 packs/day for 50 years    Types: Cigarettes  . Smokeless tobacco: Never Used  . Alcohol Use: Yes     Comment: 06/01/2013 "couple times/yr I'll have 1-2 drinks"   OB History   Grav Para Term Preterm Abortions TAB SAB Ect Mult Living                 Review of Systems  Constitutional: Negative for fever.  Respiratory: Negative for shortness of breath.   Cardiovascular: Negative for chest pain.  Gastrointestinal: Negative for abdominal pain.  Musculoskeletal:       See HPI.  Neurological: Negative for weakness and numbness.  Psychiatric/Behavioral: Negative for confusion.      Allergies  Review of patient's allergies indicates no known allergies.  Home Medications   Prior to Admission medications   Medication Sig Start Date End Date Taking? Authorizing Provider  aspirin EC 81 MG tablet Take 81 mg by mouth daily.    Historical Provider, MD  hydrochlorothiazide (MICROZIDE) 12.5 MG capsule Take 12.5 mg by mouth daily.    Historical Provider, MD  lisinopril (PRINIVIL,ZESTRIL) 5 MG tablet Take 5 mg by mouth at bedtime.    Historical Provider, MD  metoprolol tartrate (LOPRESSOR) 25 MG tablet Take 25 mg by mouth 2 (two) times daily.    Historical Provider, MD   BP 154/87  Pulse  78  Temp(Src) 98.9 F (37.2 C) (Oral)  Resp 18  SpO2 97% Physical Exam  Constitutional: She is oriented to person, place, and time. She appears well-developed and well-nourished. No distress.  Neck: Normal range of motion.  Cardiovascular:  Unable to palpate pulse of left foot, distal to injury. No pulses on vascular doppler.  Abdominal: Soft. There is no tenderness.  Musculoskeletal: She exhibits edema and tenderness.  Distal LE unstable, mobile.   Neurological: She is alert and oriented to person, place, and time.  Skin: Skin is warm and dry. There is erythema.  Left lower leg red from mid-shaft to ankle.    Psychiatric: She has a normal mood and affect.    ED Course  Procedures (including critical care time) Labs Review Labs Reviewed - No data to display  Imaging Review No results found.   EKG Interpretation None      MDM   Final diagnoses:  None    1. Left tibia/fibula fracture, unstable 2. Vascular compromise left foot.   Fracture reduced by Dr. Eulis Foster via conscious sedation. Post-reduction pulses remain absent.   Discussed with Dr. Ninfa Linden of orthopedics who has seen the patient and advised he will do surgery in one week. Advised contacting vascular surgery for any suspicion or concern for vascular injury. IR verified arterial patency. Foot remains warm wtihout concerning discoloration. Splint in place. Per patient, there is family at home that can assist with care until surgery. She states she has a walker at home. She is advised to see her cardiologist for clearance for surgery prior to being seen by orthopedics. Stable for discharge home.     Dewaine Oats, PA-C 09/09/13 [place], PA-C 09/09/13 [place], PA-C 09/09/13 1754

## 2013-09-09 NOTE — Sedation Documentation (Signed)
Medication dose calculated and verified for: Kathryn Dixon

## 2013-09-09 NOTE — Progress Notes (Signed)
Orthopedic Tech Progress Note Patient Details:  Kathryn Dixon Jan 30, 1946 [id number]  Patient ID: Kathryn Dixon, female   DOB: [birth year], 68 y.o.   MRN: [id number] As ordered by Dr. Daleen Bo  Kathryn Dixon 09/09/2013, 4:40 PM

## 2013-09-09 NOTE — ED Notes (Signed)
Patient is alert. Resting.  Awaiting her ride.  Patient reports 8/10 pain.

## 2013-09-13 ENCOUNTER — Ambulatory Visit (INDEPENDENT_AMBULATORY_CARE_PROVIDER_SITE_OTHER): Payer: Medicare Other | Admitting: Internal Medicine

## 2013-09-13 ENCOUNTER — Encounter: Payer: Self-pay | Admitting: Internal Medicine

## 2013-09-13 VITALS — BP 150/87 | HR 78 | Ht 64.0 in

## 2013-09-13 DIAGNOSIS — I5022 Chronic systolic (congestive) heart failure: Secondary | ICD-10-CM

## 2013-09-13 DIAGNOSIS — Z95810 Presence of automatic (implantable) cardiac defibrillator: Secondary | ICD-10-CM | POA: Insufficient documentation

## 2013-09-13 DIAGNOSIS — I255 Ischemic cardiomyopathy: Secondary | ICD-10-CM

## 2013-09-13 DIAGNOSIS — I2589 Other forms of chronic ischemic heart disease: Secondary | ICD-10-CM

## 2013-09-13 LAB — MDC_IDC_ENUM_SESS_TYPE_INCLINIC
Battery Voltage: 3.01 V
Brady Statistic AP VP Percent: 8.38 %
Brady Statistic AP VS Percent: 0.14 %
Brady Statistic AS VP Percent: 89.7 %
Brady Statistic AS VS Percent: 1.78 %
Brady Statistic RV Percent Paced: 29.72 %
HighPow Impedance: 247 Ohm
HighPow Impedance: 59 Ohm
Lead Channel Impedance Value: 513 Ohm
Lead Channel Pacing Threshold Amplitude: 0.625 V
Lead Channel Pacing Threshold Amplitude: 0.75 V
Lead Channel Pacing Threshold Amplitude: 2.125 V
Lead Channel Pacing Threshold Pulse Width: 0.4 ms
Lead Channel Sensing Intrinsic Amplitude: 2.25 mV
Lead Channel Sensing Intrinsic Amplitude: 31.625 mV
Lead Channel Setting Pacing Amplitude: 1.75 V
Lead Channel Setting Pacing Amplitude: 2 V
Lead Channel Setting Pacing Amplitude: 3.25 V
Lead Channel Setting Pacing Pulse Width: 0.4 ms
Lead Channel Setting Pacing Pulse Width: 0.8 ms
MDC IDC MSMT BATTERY REMAINING LONGEVITY: 69 mo
MDC IDC MSMT LEADCHNL LV PACING THRESHOLD PULSEWIDTH: 0.8 ms
MDC IDC MSMT LEADCHNL RA IMPEDANCE VALUE: 494 Ohm
MDC IDC MSMT LEADCHNL RA SENSING INTR AMPL: 3.125 mV
MDC IDC MSMT LEADCHNL RV PACING THRESHOLD PULSEWIDTH: 0.4 ms
MDC IDC MSMT LEADCHNL RV SENSING INTR AMPL: 31.625 mV
MDC IDC SESS DTM: 20150421115115
MDC IDC SET LEADCHNL RV SENSING SENSITIVITY: 0.3 mV
MDC IDC SET ZONE DETECTION INTERVAL: 300 ms
MDC IDC SET ZONE DETECTION INTERVAL: 450 ms
MDC IDC STAT BRADY RA PERCENT PACED: 8.52 %
Zone Setting Detection Interval: 340 ms
Zone Setting Detection Interval: 350 ms

## 2013-09-13 NOTE — Assessment & Plan Note (Signed)
Her symptoms are class 2A. Continue current meds. I have discussed the importance of a low sodium diet.

## 2013-09-13 NOTE — Progress Notes (Signed)
HPI Kathryn Dixon returns today for followup. She is a pleasant 68 yo woman with a ICM, chronic systolic heart failure, LBBB, s/p BiV ICD. In the interim, she has done well except she broke her foot a few days ago and is in a soft cast. No chest pain. No ICD shock. No Known Allergies   Current Outpatient Prescriptions  Medication Sig Dispense Refill  . aspirin EC 81 MG tablet Take 81 mg by mouth daily.      . hydrochlorothiazide (MICROZIDE) 12.5 MG capsule Take 12.5 mg by mouth daily.      Marland Kitchen HYDROcodone-acetaminophen (NORCO/VICODIN) 5-325 MG per tablet Take 1-2 tablets by mouth every 4 (four) hours as needed.  30 tablet  0  . lisinopril (PRINIVIL,ZESTRIL) 5 MG tablet Take 5 mg by mouth at bedtime.      . metoprolol tartrate (LOPRESSOR) 25 MG tablet Take 25 mg by mouth 2 (two) times daily.       No current facility-administered medications for this visit.     Past Medical History  Diagnosis Date  . Hypertension   . Coronary artery disease   . LBBB (left bundle branch block)   . Ischemic cardiomyopathy   . Thoracic aneurysm without mention of rupture   . Smoker   . S/P CABG x 2 - LIMA-LAD, SVG-OM, 01/27/13 01/28/2013  . Myocardial infarction 1990's;01/2013    ; "mild, right before OHS"  . Exertional shortness of breath     ROS:   All systems reviewed and negative except as noted in the HPI.   Past Surgical History  Procedure Laterality Date  . Abdominal hysterectomy    . Fracture surgery    . Coronary artery bypass graft N/A 01/27/2013    Procedure: CORONARY ARTERY BYPASS GRAFTING times two using Right Greater Saphenous Vein Graft Harvsted Endoscopically and Left Internal Mammary Artery;  Surgeon: Gaye Pollack, MD;  Location: [facility] OR;  Service: Open Heart Surgery;  Laterality: N/A;  . Intraoperative transesophageal echocardiogram N/A 01/27/2013    Procedure: INTRAOPERATIVE TRANSESOPHAGEAL ECHOCARDIOGRAM;  Surgeon: Gaye Pollack, MD;  Location: [facility] OR;  Service: Open Heart  Surgery;  Laterality: N/A;  . Dilation and curettage of uterus    . Tubal ligation       No family history on file.   History   Social History  . Marital Status: Divorced    Spouse Name: N/A    Number of Children: N/A  . Years of Education: N/A   Occupational History  . Not on file.   Social History Main Topics  . Smoking status: Current Some Day Smoker -- 0.12 packs/day for 50 years    Types: Cigarettes  . Smokeless tobacco: Never Used  . Alcohol Use: Yes     Comment: 06/01/2013 "couple times/yr I'll have 1-2 drinks"  . Drug Use: No  . Sexual Activity: Not Currently   Other Topics Concern  . Not on file   Social History Narrative  . No narrative on file     BP 150/87  Pulse 78  Ht 5\' 4"  (1.626 m)  Physical Exam:  Well appearing middle aged woman, NAD HEENT: Unremarkable Neck:  No JVD, no thyromegally Back:  No CVA tenderness Lungs:  Clear with no wheezes HEART:  Regular rate rhythm, no murmurs, no rubs, no clicks Abd:  soft, positive bowel sounds, no organomegally, no rebound, no guarding Ext:  2 plus pulses, no edema, no cyanosis, no clubbing, left foot in a soft  cast Skin:  No rashes no nodules Neuro:  CN II through XII intact, motor grossly intact   DEVICE  Normal device function.  See PaceArt for details.   Assess/Plan:

## 2013-09-13 NOTE — Patient Instructions (Signed)
Your physician wants you to follow-up in: 9 months with Dr Taylor You will receive a reminder letter in the mail two months in advance. If you don't receive a letter, please call our office to schedule the follow-up appointment.    Remote monitoring is used to monitor your Pacemaker or ICD from home. This monitoring reduces the number of office visits required to check your device to one time per year. It allows us to keep an eye on the functioning of your device to ensure it is working properly. You are scheduled for a device check from home on 12/15/13. You may send your transmission at any time that day. If you have a wireless device, the transmission will be sent automatically. After your physician reviews your transmission, you will receive a postcard with your next transmission date.   

## 2013-09-13 NOTE — Assessment & Plan Note (Signed)
Her BiV ICD is working normally. Will recheck in several months. 

## 2013-10-28 ENCOUNTER — Telehealth: Payer: Self-pay | Admitting: Cardiology

## 2013-10-28 NOTE — Telephone Encounter (Signed)
Pt's Metoprolol was increased and she is running out of it. She tried to get some more from the pharmacy,she could not get them. She needs a new prescription since the dose have changed. Please call to [facility].Please call this in today.

## 2013-10-31 MED ORDER — METOPROLOL TARTRATE 25 MG PO TABS: 25.0000 mg | ORAL_TABLET | Freq: Two times a day (BID) | ORAL | Status: DC

## 2013-10-31 NOTE — Telephone Encounter (Signed)
Rx was sent to pharmacy electronically. 

## 2013-10-31 NOTE — Telephone Encounter (Signed)
Pt is sill waiting for her Metoprolol. Please call this in today if possible.

## 2013-11-19 IMAGING — CR DG CHEST 2V
2 series · 2 of 2 positions shown · non-contrast
Comparison: 01/29/2013 and earlier.

CLINICAL DATA: 67-year-old female with weakness. History of CABG
and coronary artery disease.

EXAM:
CHEST  2 VIEW

[w chest lat]
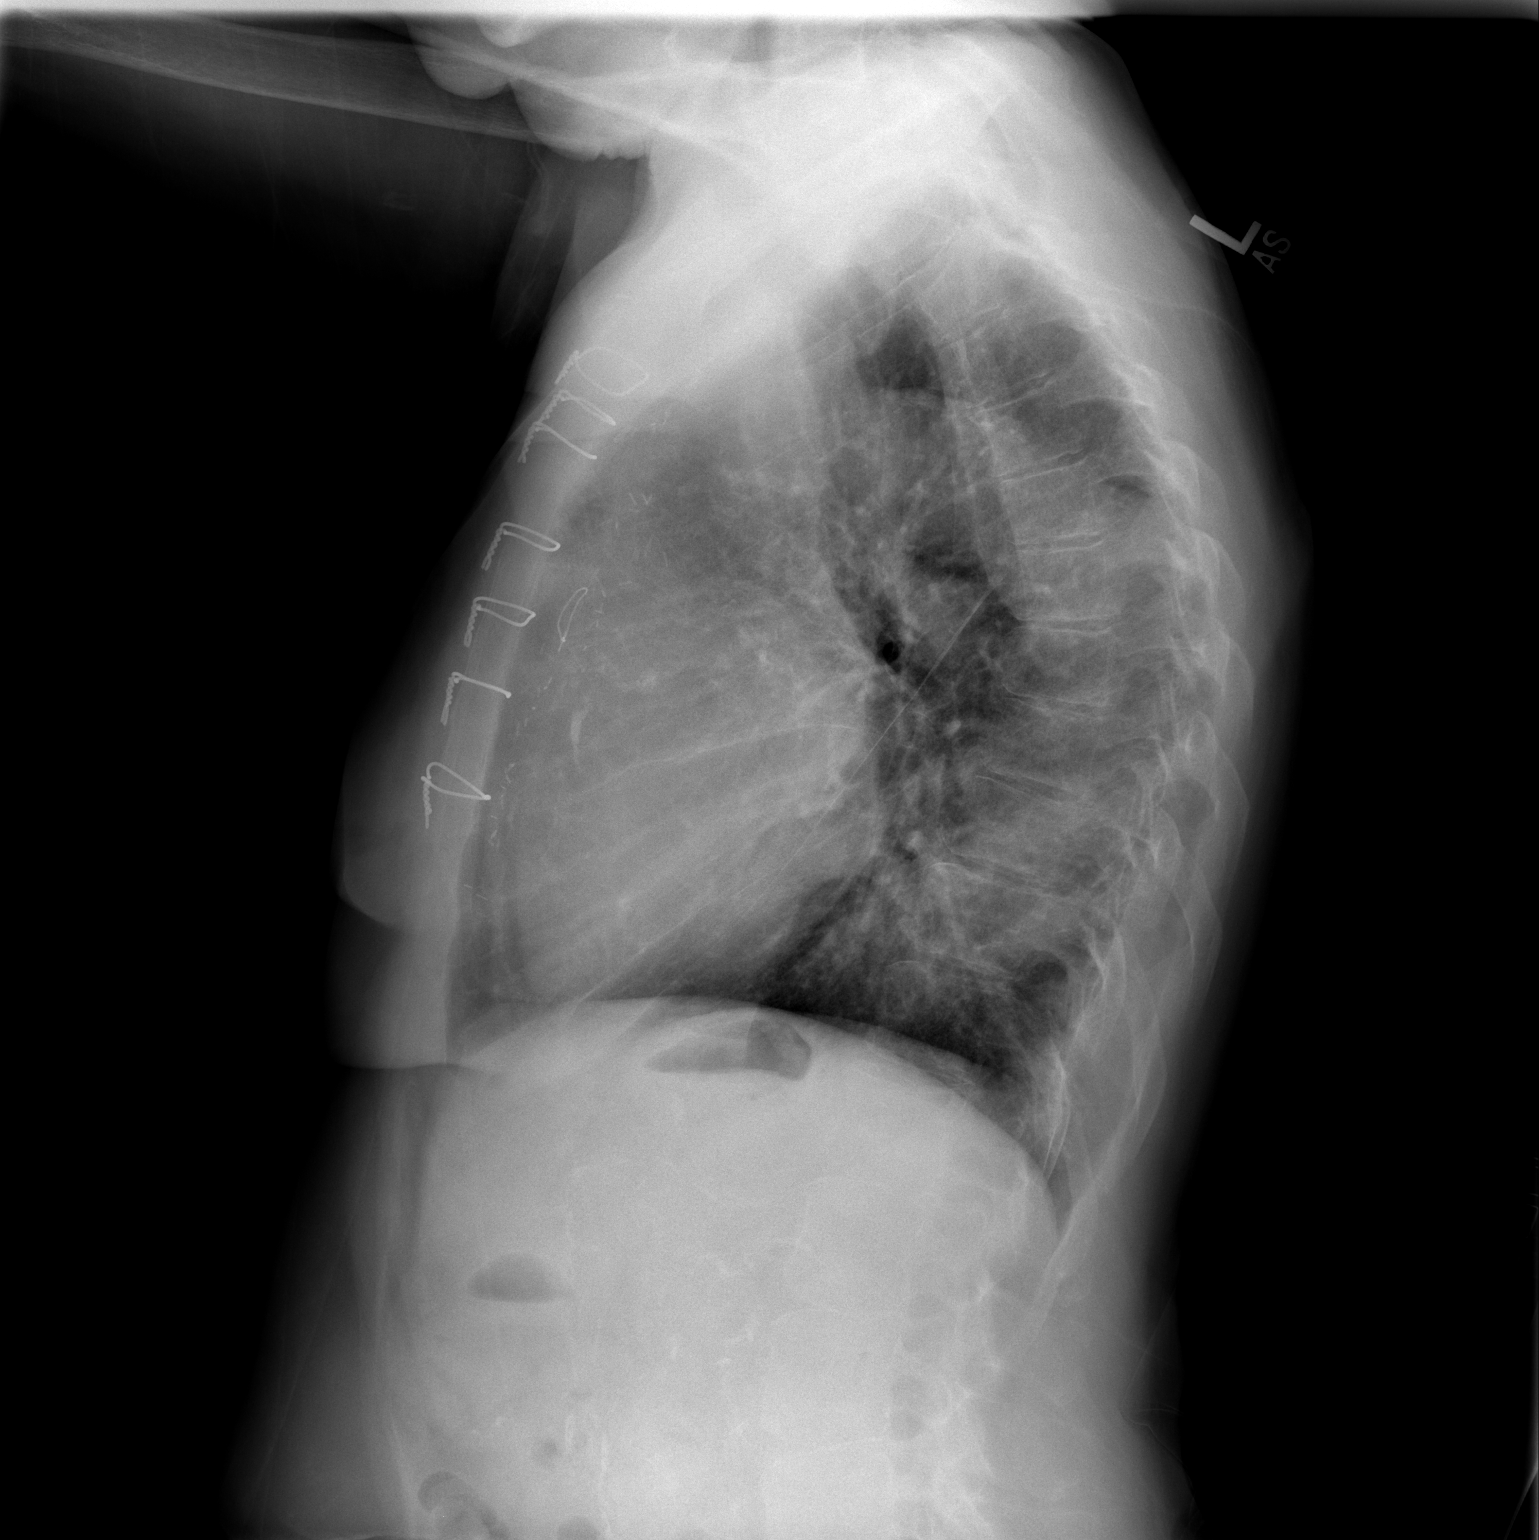

[w chest ap]
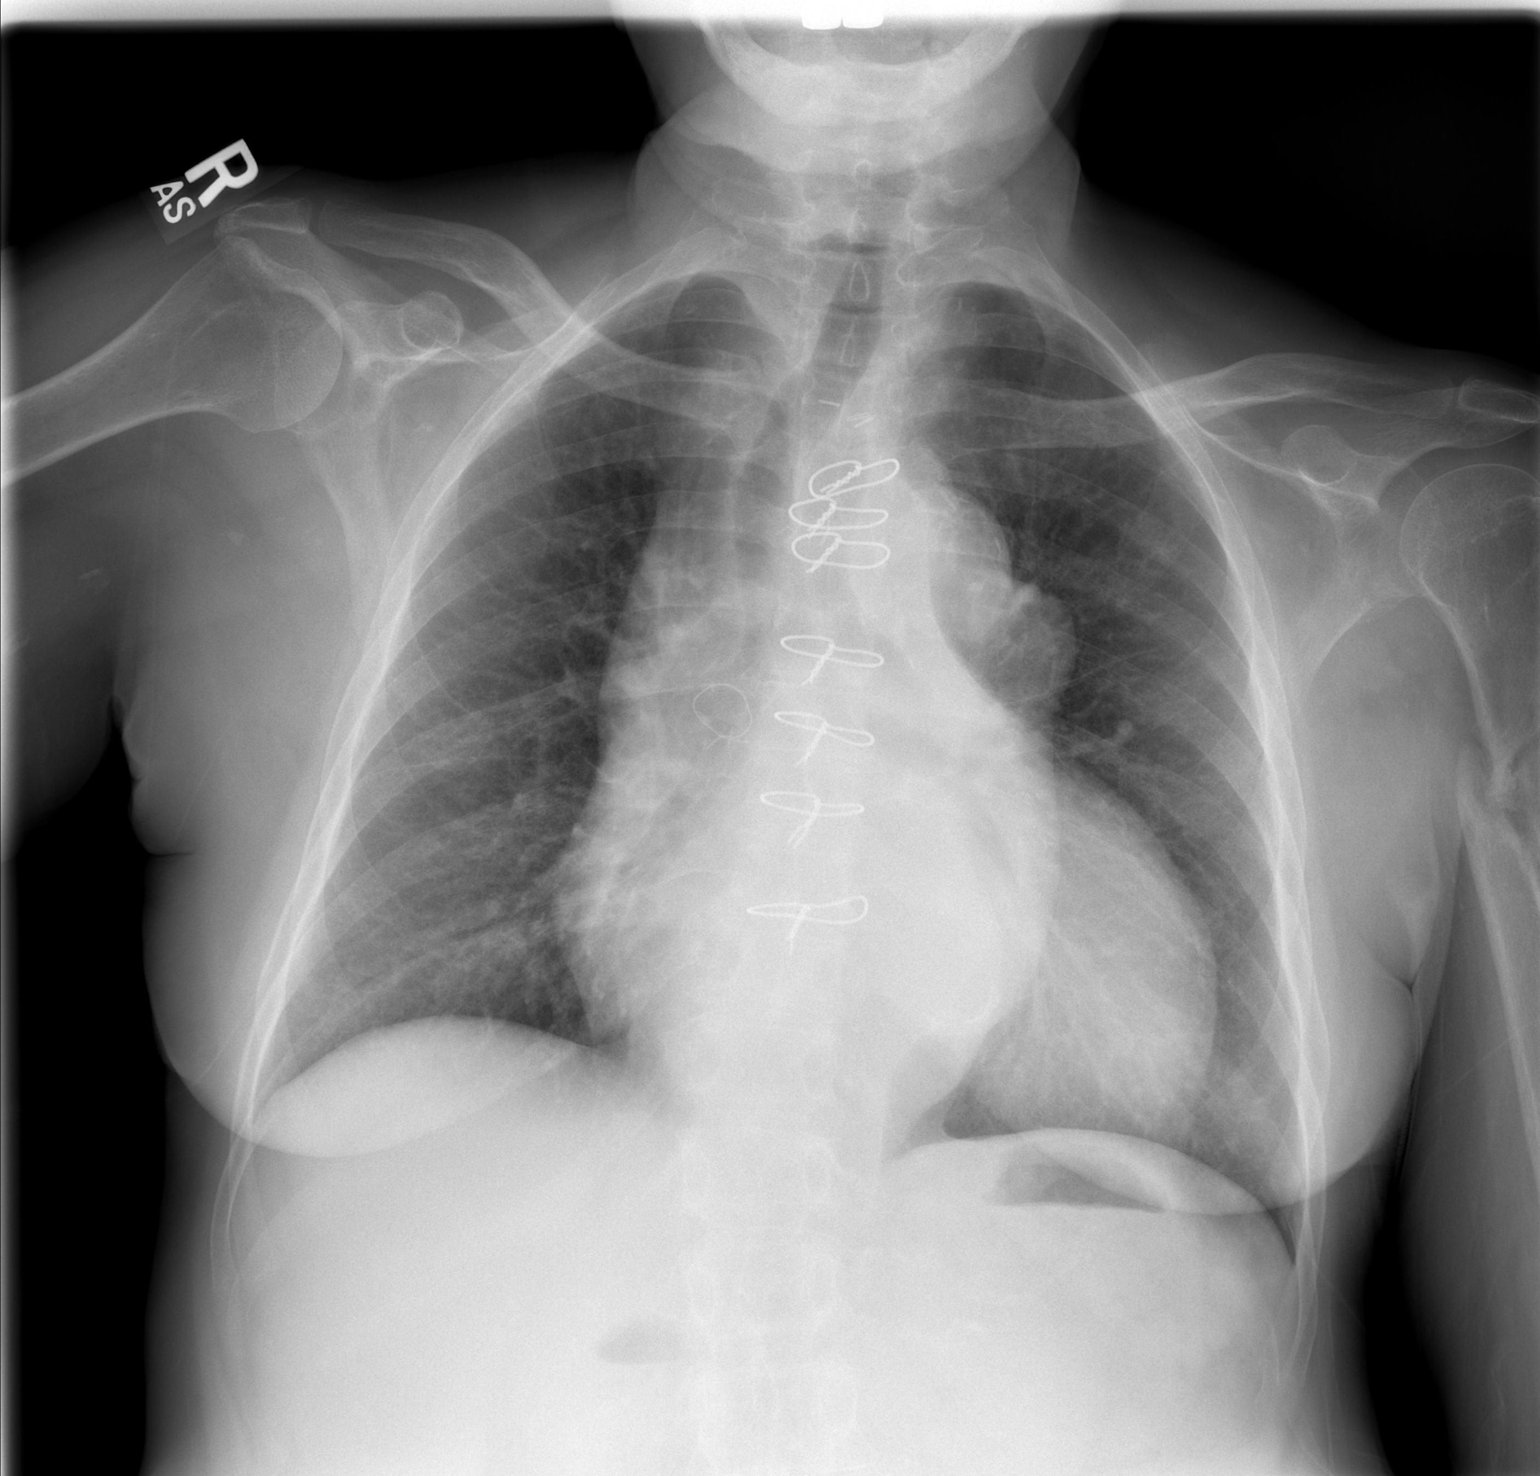

[2 of 2 positions shown; findings below may reference images not displayed]

FINDINGS: Stable cardiomegaly and mediastinal contours. Sequelae of CABG.
Visualized tracheal air column is within normal limits.

Resolved bilateral pulmonary edema and significantly improved
ventilation. No pleural effusion or pneumothorax. Chronically
tortuous/aneurysmal thoracic aorta. Radiographic mediastinal
contours are stable. No acute osseous abnormality identified.
IMPRESSION: Interval resolved pulmonary edema and No acute cardiopulmonary
abnormality. Chronic thoracic aortic aneurysm, most recently
evaluated with CTA on 01/19/2013.

## 2013-12-12 ENCOUNTER — Telehealth: Payer: Self-pay | Admitting: Internal Medicine

## 2013-12-12 NOTE — Telephone Encounter (Signed)
New message      Returning someone's call from the device clinic

## 2013-12-12 NOTE — Telephone Encounter (Signed)
Spoke with patient and reviewed procedure for remote transmission that she will send 7/23.

## 2013-12-15 ENCOUNTER — Encounter: Payer: Self-pay | Admitting: Internal Medicine

## 2013-12-15 ENCOUNTER — Telehealth: Payer: Self-pay | Admitting: Cardiology

## 2013-12-15 ENCOUNTER — Ambulatory Visit (INDEPENDENT_AMBULATORY_CARE_PROVIDER_SITE_OTHER): Payer: Medicare Other | Admitting: *Deleted

## 2013-12-15 DIAGNOSIS — I255 Ischemic cardiomyopathy: Secondary | ICD-10-CM

## 2013-12-15 DIAGNOSIS — I2589 Other forms of chronic ischemic heart disease: Secondary | ICD-10-CM

## 2013-12-15 DIAGNOSIS — I5022 Chronic systolic (congestive) heart failure: Secondary | ICD-10-CM

## 2013-12-15 NOTE — Telephone Encounter (Signed)
LMOVM reminding pt to send remote transmission.   

## 2013-12-16 NOTE — Progress Notes (Signed)
Remote ICD transmission.   

## 2013-12-19 LAB — MDC_IDC_ENUM_SESS_TYPE_REMOTE
Battery Voltage: 2.99 V
Brady Statistic AP VP Percent: 9.33 %
Brady Statistic AS VP Percent: 89.01 %
Brady Statistic RA Percent Paced: 9.48 %
HIGH POWER IMPEDANCE MEASURED VALUE: 209 Ohm
HIGH POWER IMPEDANCE MEASURED VALUE: 62 Ohm
Lead Channel Impedance Value: 532 Ohm
Lead Channel Pacing Threshold Amplitude: 0.75 V
Lead Channel Pacing Threshold Amplitude: 0.75 V
Lead Channel Pacing Threshold Pulse Width: 0.4 ms
Lead Channel Pacing Threshold Pulse Width: 0.8 ms
Lead Channel Sensing Intrinsic Amplitude: 1.875 mV
Lead Channel Sensing Intrinsic Amplitude: 31.625 mV
Lead Channel Setting Pacing Amplitude: 1.75 V
Lead Channel Setting Pacing Amplitude: 2 V
Lead Channel Setting Pacing Pulse Width: 0.4 ms
Lead Channel Setting Pacing Pulse Width: 0.8 ms
Lead Channel Setting Sensing Sensitivity: 0.3 mV
MDC IDC MSMT BATTERY REMAINING LONGEVITY: 66 mo
MDC IDC MSMT LEADCHNL LV PACING THRESHOLD AMPLITUDE: 2.5 V
MDC IDC MSMT LEADCHNL RA IMPEDANCE VALUE: 494 Ohm
MDC IDC MSMT LEADCHNL RA PACING THRESHOLD PULSEWIDTH: 0.4 ms
MDC IDC MSMT LEADCHNL RV SENSING INTR AMPL: 31.625 mV
MDC IDC SESS DTM: 20150723202924
MDC IDC SET LEADCHNL LV PACING AMPLITUDE: 3.5 V
MDC IDC SET ZONE DETECTION INTERVAL: 350 ms
MDC IDC STAT BRADY AP VS PERCENT: 0.14 %
MDC IDC STAT BRADY AS VS PERCENT: 1.52 %
MDC IDC STAT BRADY RV PERCENT PACED: 39.85 %
Zone Setting Detection Interval: 300 ms
Zone Setting Detection Interval: 340 ms
Zone Setting Detection Interval: 450 ms

## 2013-12-30 ENCOUNTER — Encounter: Payer: Self-pay | Admitting: Cardiology

## 2014-01-20 ENCOUNTER — Telehealth: Payer: Self-pay | Admitting: Cardiology

## 2014-01-20 NOTE — Telephone Encounter (Signed)
Pt called in stating that she is almost out of her Metoprolol and when she went to the pharmacy to pick it up the pharmacy told her that she could not pick it up until 09/05 . She doesn't understand why she can not pick up the medication now if she will soon be out. Please call  Thanks

## 2014-01-20 NOTE — Telephone Encounter (Signed)
Called pharmacy to confirm they had most up to date Rx on file - they did not - updated them with most recent refill from 10/31/13. They will fill Rx.   Patient notified.

## 2014-03-21 ENCOUNTER — Ambulatory Visit (INDEPENDENT_AMBULATORY_CARE_PROVIDER_SITE_OTHER): Payer: Medicaid Other | Admitting: *Deleted

## 2014-03-21 DIAGNOSIS — I255 Ischemic cardiomyopathy: Secondary | ICD-10-CM

## 2014-03-21 DIAGNOSIS — I5022 Chronic systolic (congestive) heart failure: Secondary | ICD-10-CM

## 2014-03-21 NOTE — Progress Notes (Signed)
Remote ICD transmission.   

## 2014-03-24 LAB — MDC_IDC_ENUM_SESS_TYPE_REMOTE
Battery Remaining Longevity: 51 mo
Brady Statistic AP VP Percent: 15.62 %
Brady Statistic AP VS Percent: 0.25 %
Brady Statistic AS VP Percent: 82.47 %
Brady Statistic AS VS Percent: 1.65 %
Brady Statistic RA Percent Paced: 15.87 %
Date Time Interrogation Session: 20151027062609
HighPow Impedance: 65 Ohm
Lead Channel Impedance Value: 1045 Ohm
Lead Channel Impedance Value: 380 Ohm
Lead Channel Impedance Value: 399 Ohm
Lead Channel Impedance Value: 494 Ohm
Lead Channel Impedance Value: 494 Ohm
Lead Channel Impedance Value: 494 Ohm
Lead Channel Impedance Value: 513 Ohm
Lead Channel Impedance Value: 703 Ohm
Lead Channel Impedance Value: 722 Ohm
Lead Channel Impedance Value: 969 Ohm
Lead Channel Pacing Threshold Amplitude: 0.75 V
Lead Channel Pacing Threshold Amplitude: 0.75 V
Lead Channel Pacing Threshold Amplitude: 2.5 V
Lead Channel Pacing Threshold Pulse Width: 0.4 ms
Lead Channel Sensing Intrinsic Amplitude: 2.625 mV
Lead Channel Sensing Intrinsic Amplitude: 31.625 mV
Lead Channel Setting Pacing Pulse Width: 0.4 ms
MDC IDC MSMT BATTERY VOLTAGE: 2.98 V
MDC IDC MSMT LEADCHNL LV IMPEDANCE VALUE: 1045 Ohm
MDC IDC MSMT LEADCHNL LV IMPEDANCE VALUE: 570 Ohm
MDC IDC MSMT LEADCHNL LV IMPEDANCE VALUE: 722 Ohm
MDC IDC MSMT LEADCHNL LV PACING THRESHOLD PULSEWIDTH: 0.8 ms
MDC IDC MSMT LEADCHNL RA PACING THRESHOLD PULSEWIDTH: 0.4 ms
MDC IDC MSMT LEADCHNL RA SENSING INTR AMPL: 2.625 mV
MDC IDC MSMT LEADCHNL RV SENSING INTR AMPL: 31.625 mV
MDC IDC SET LEADCHNL LV PACING AMPLITUDE: 3.75 V
MDC IDC SET LEADCHNL LV PACING PULSEWIDTH: 0.8 ms
MDC IDC SET LEADCHNL RA PACING AMPLITUDE: 1.5 V
MDC IDC SET LEADCHNL RV PACING AMPLITUDE: 2 V
MDC IDC SET LEADCHNL RV SENSING SENSITIVITY: 0.3 mV
MDC IDC SET ZONE DETECTION INTERVAL: 340 ms
MDC IDC STAT BRADY RV PERCENT PACED: 47.49 %
Zone Setting Detection Interval: 300 ms
Zone Setting Detection Interval: 350 ms
Zone Setting Detection Interval: 450 ms

## 2014-04-13 ENCOUNTER — Encounter: Payer: Self-pay | Admitting: Cardiology

## 2014-04-18 ENCOUNTER — Other Ambulatory Visit: Payer: Self-pay | Admitting: *Deleted

## 2014-04-18 MED ORDER — LISINOPRIL 5 MG PO TABS: 5.0000 mg | ORAL_TABLET | Freq: Every day | ORAL | Status: DC

## 2014-04-18 MED ORDER — HYDROCHLOROTHIAZIDE 12.5 MG PO CAPS: 12.5000 mg | ORAL_CAPSULE | Freq: Every day | ORAL | Status: DC

## 2014-04-18 NOTE — Telephone Encounter (Signed)
Refilled electronically with a notation: Patient needs to schedule an appointment for future refills

## 2014-04-19 ENCOUNTER — Encounter: Payer: Self-pay | Admitting: Internal Medicine

## 2014-04-27 ENCOUNTER — Telehealth: Payer: Self-pay | Admitting: *Deleted

## 2014-04-27 NOTE — Telephone Encounter (Signed)
New Message  Pt does not take flu shots.

## 2014-05-04 ENCOUNTER — Encounter (HOSPITAL_COMMUNITY): Payer: Self-pay | Admitting: Cardiology

## 2014-06-12 ENCOUNTER — Encounter: Payer: Self-pay | Admitting: Internal Medicine

## 2014-06-12 ENCOUNTER — Ambulatory Visit (INDEPENDENT_AMBULATORY_CARE_PROVIDER_SITE_OTHER): Payer: Medicare Other | Admitting: Internal Medicine

## 2014-06-12 ENCOUNTER — Other Ambulatory Visit: Payer: Self-pay | Admitting: Internal Medicine

## 2014-06-12 VITALS — BP 140/98 | HR 67 | Ht 64.0 in | Wt 139.6 lb

## 2014-06-12 DIAGNOSIS — Z95810 Presence of automatic (implantable) cardiac defibrillator: Secondary | ICD-10-CM

## 2014-06-12 DIAGNOSIS — I5022 Chronic systolic (congestive) heart failure: Secondary | ICD-10-CM

## 2014-06-12 DIAGNOSIS — I255 Ischemic cardiomyopathy: Secondary | ICD-10-CM

## 2014-06-12 LAB — MDC_IDC_ENUM_SESS_TYPE_INCLINIC
Battery Remaining Longevity: 75 mo
Battery Voltage: 2.97 V
Brady Statistic AP VP Percent: 15.06 %
Brady Statistic RV Percent Paced: 41.4 %
HighPow Impedance: 247 Ohm
HighPow Impedance: 67 Ohm
Lead Channel Impedance Value: 437 Ohm
Lead Channel Pacing Threshold Amplitude: 0.5 V
Lead Channel Pacing Threshold Amplitude: 0.625 V
Lead Channel Pacing Threshold Pulse Width: 0.4 ms
Lead Channel Pacing Threshold Pulse Width: 0.4 ms
Lead Channel Setting Pacing Amplitude: 1.5 V
Lead Channel Setting Pacing Pulse Width: 0.4 ms
Lead Channel Setting Sensing Sensitivity: 0.3 mV
MDC IDC MSMT LEADCHNL RA SENSING INTR AMPL: 2.375 mV
MDC IDC MSMT LEADCHNL RV IMPEDANCE VALUE: 494 Ohm
MDC IDC MSMT LEADCHNL RV PACING THRESHOLD AMPLITUDE: 0.75 V
MDC IDC MSMT LEADCHNL RV PACING THRESHOLD PULSEWIDTH: 0.4 ms
MDC IDC MSMT LEADCHNL RV SENSING INTR AMPL: 31.625 mV
MDC IDC SESS DTM: 20160118144155
MDC IDC SET LEADCHNL LV PACING AMPLITUDE: 2.5 V
MDC IDC SET LEADCHNL LV PACING PULSEWIDTH: 0.8 ms
MDC IDC SET LEADCHNL RV PACING AMPLITUDE: 2 V
MDC IDC STAT BRADY AP VS PERCENT: 0.23 %
MDC IDC STAT BRADY AS VP PERCENT: 83.14 %
MDC IDC STAT BRADY AS VS PERCENT: 1.56 %
MDC IDC STAT BRADY RA PERCENT PACED: 15.3 %
Zone Setting Detection Interval: 300 ms
Zone Setting Detection Interval: 340 ms
Zone Setting Detection Interval: 350 ms
Zone Setting Detection Interval: 450 ms

## 2014-06-12 MED ORDER — LISINOPRIL 5 MG PO TABS: 5.0000 mg | ORAL_TABLET | Freq: Every day | ORAL | Status: DC

## 2014-06-12 MED ORDER — METOPROLOL TARTRATE 25 MG PO TABS: 25.0000 mg | ORAL_TABLET | Freq: Two times a day (BID) | ORAL | Status: DC

## 2014-06-12 MED ORDER — HYDROCHLOROTHIAZIDE 12.5 MG PO CAPS: 12.5000 mg | ORAL_CAPSULE | Freq: Every day | ORAL | Status: DC

## 2014-06-12 NOTE — Patient Instructions (Signed)
Your physician wants you to follow-up in: 1 year with Dr. Lovena Le.  You will receive a reminder letter in the mail two months in advance. If you don't receive a letter, please call our office to schedule the follow-up appointment.  Remote monitoring is used to monitor your Pacemaker of ICD from home. This monitoring reduces the number of office visits required to check your device to one time per year. It allows [place] to keep an eye on the functioning of your device to ensure it is working properly. You are scheduled for a device check from home on 09/11/2014. You may send your transmission at any time that day. If you have a wireless device, the transmission will be sent automatically. After your physician reviews your transmission, you will receive a postcard with your next transmission date.

## 2014-06-12 NOTE — Assessment & Plan Note (Signed)
Her symptoms remain class 2 but she is very sedentary. I have encouraged the patient to increase her physical activity. No change in medical therapy.

## 2014-06-12 NOTE — Assessment & Plan Note (Signed)
She denies anginal symptoms. No change in medical therapy.

## 2014-06-12 NOTE — Progress Notes (Signed)
HPI Kathryn Dixon returns today for followup. She is a pleasant 69 yo woman with a ICM, chronic systolic heart failure, LBBB, s/p BiV ICD. In the interim, she has done well. Since she broke her foot, he has been sedentary. No chest pain. No ICD shock. No Known Allergies   Current Outpatient Prescriptions  Medication Sig Dispense Refill  . aspirin EC 81 MG tablet Take 81 mg by mouth daily.    . hydrochlorothiazide (MICROZIDE) 12.5 MG capsule Take 12.5 mg by mouth daily.    Marland Kitchen HYDROcodone-acetaminophen (NORCO/VICODIN) 5-325 MG per tablet Take 1-2 tablets by mouth every 4 (four) hours as needed. (Patient taking differently: Take 1-2 tablets by mouth every 4 (four) hours as needed (pain). ) 30 tablet 0  . lisinopril (PRINIVIL,ZESTRIL) 5 MG tablet Take 5 mg by mouth daily.    . metoprolol tartrate (LOPRESSOR) 25 MG tablet Take 1 tablet (25 mg total) by mouth 2 (two) times daily. 60 tablet 6   No current facility-administered medications for this visit.     Past Medical History  Diagnosis Date  . Hypertension   . Coronary artery disease   . LBBB (left bundle branch block)   . Ischemic cardiomyopathy   . Thoracic aneurysm without mention of rupture   . Smoker   . S/P CABG x 2 - LIMA-LAD, SVG-OM, 01/27/13 01/28/2013  . Myocardial infarction 1990's;01/2013    ; "mild, right before OHS"  . Exertional shortness of breath     ROS:   All systems reviewed and negative except as noted in the HPI.   Past Surgical History  Procedure Laterality Date  . Abdominal hysterectomy    . Fracture surgery    . Coronary artery bypass graft N/A 01/27/2013    Procedure: CORONARY ARTERY BYPASS GRAFTING times two using Right Greater Saphenous Vein Graft Harvsted Endoscopically and Left Internal Mammary Artery;  Surgeon: Gaye Pollack, MD;  Location: [facility] OR;  Service: Open Heart Surgery;  Laterality: N/A;  . Intraoperative transesophageal echocardiogram N/A 01/27/2013    Procedure: INTRAOPERATIVE  TRANSESOPHAGEAL ECHOCARDIOGRAM;  Surgeon: Gaye Pollack, MD;  Location: [facility] OR;  Service: Open Heart Surgery;  Laterality: N/A;  . Dilation and curettage of uterus    . Tubal ligation    . Left heart catheterization with coronary angiogram N/A 01/20/2013    Procedure: LEFT HEART CATHETERIZATION WITH CORONARY ANGIOGRAM;  Surgeon: Leonie Man, MD;  Location: [facility] CATH LAB;  Service: Cardiovascular;  Laterality: N/A;  . Right heart catheterization  01/20/2013    Procedure: RIGHT HEART CATH;  Surgeon: Leonie Man, MD;  Location: [facility] CATH LAB;  Service: Cardiovascular;;  . Bi-ventricular implantable cardioverter defibrillator N/A 06/01/2013    Procedure: BI-VENTRICULAR IMPLANTABLE CARDIOVERTER DEFIBRILLATOR  (CRT-D);  Surgeon: Evans Lance, MD;  Location: [facility] CATH LAB;  Service: Cardiovascular;  Laterality: N/A;     No family history on file.   History   Social History  . Marital Status: Divorced    Spouse Name: N/A    Number of Children: N/A  . Years of Education: N/A   Occupational History  . Not on file.   Social History Main Topics  . Smoking status: Current Some Day Smoker -- 0.12 packs/day for 50 years    Types: Cigarettes  . Smokeless tobacco: Never Used  . Alcohol Use: Yes     Comment: 06/01/2013 "couple times/yr I'll have 1-2 drinks"  . Drug Use: No  . Sexual Activity: Not Currently  Other Topics Concern  . Not on file   Social History Narrative     BP 140/98 mmHg  Pulse 67  Ht 5\' 4"  (1.626 m)  Wt 139 lb 9.6 oz (63.322 kg)  BMI 23.95 kg/m2  Physical Exam:  Well appearing middle aged woman, NAD HEENT: Unremarkable Neck:  No JVD, no thyromegally Back:  No CVA tenderness Lungs:  Clear with no wheezes HEART:  Regular rate rhythm, no murmurs, no rubs, no clicks Abd:  soft, positive bowel sounds, no organomegally, no rebound, no guarding Ext:  2 plus pulses, no edema, no cyanosis, no clubbing, left foot in a soft cast Skin:  No rashes no nodules Neuro:  CN II  through XII intact, motor grossly intact   DEVICE  Normal device function.  See PaceArt for details.   Assess/Plan:

## 2014-06-12 NOTE — Assessment & Plan Note (Signed)
Interogation of her Medtronic device demonstrated an elevated LV threshold (3-can). Her threshold 4-can was good and her device was reprogrammed.

## 2014-06-21 ENCOUNTER — Encounter: Payer: Self-pay | Admitting: Internal Medicine

## 2014-09-11 ENCOUNTER — Encounter: Payer: Self-pay | Admitting: Internal Medicine

## 2014-09-11 ENCOUNTER — Ambulatory Visit (INDEPENDENT_AMBULATORY_CARE_PROVIDER_SITE_OTHER): Payer: Medicare Other | Admitting: *Deleted

## 2014-09-11 DIAGNOSIS — I5022 Chronic systolic (congestive) heart failure: Secondary | ICD-10-CM | POA: Diagnosis not present

## 2014-09-11 DIAGNOSIS — I255 Ischemic cardiomyopathy: Secondary | ICD-10-CM

## 2014-09-13 NOTE — Progress Notes (Signed)
Remote ICD transmission.   

## 2014-09-17 LAB — MDC_IDC_ENUM_SESS_TYPE_REMOTE
Battery Remaining Longevity: 20 mo
Battery Voltage: 2.94 V
Brady Statistic AP VP Percent: 13.53 %
Brady Statistic AS VS Percent: 1.7 %
Brady Statistic RV Percent Paced: 39.76 %
Date Time Interrogation Session: 20160418151905
HighPow Impedance: 65 Ohm
Lead Channel Impedance Value: 494 Ohm
Lead Channel Impedance Value: 494 Ohm
Lead Channel Impedance Value: 703 Ohm
Lead Channel Impedance Value: 912 Ohm
Lead Channel Impedance Value: 912 Ohm
Lead Channel Impedance Value: 950 Ohm
Lead Channel Pacing Threshold Amplitude: 0.75 V
Lead Channel Pacing Threshold Amplitude: 6 V
Lead Channel Pacing Threshold Pulse Width: 0.4 ms
Lead Channel Pacing Threshold Pulse Width: 0.8 ms
Lead Channel Sensing Intrinsic Amplitude: 19.5 mV
Lead Channel Sensing Intrinsic Amplitude: 2.25 mV
Lead Channel Setting Pacing Amplitude: 2 V
Lead Channel Setting Pacing Pulse Width: 0.4 ms
Lead Channel Setting Sensing Sensitivity: 0.3 mV
MDC IDC MSMT LEADCHNL LV IMPEDANCE VALUE: 399 Ohm
MDC IDC MSMT LEADCHNL LV IMPEDANCE VALUE: 456 Ohm
MDC IDC MSMT LEADCHNL LV IMPEDANCE VALUE: 532 Ohm
MDC IDC MSMT LEADCHNL LV IMPEDANCE VALUE: 627 Ohm
MDC IDC MSMT LEADCHNL LV IMPEDANCE VALUE: 760 Ohm
MDC IDC MSMT LEADCHNL RA IMPEDANCE VALUE: 437 Ohm
MDC IDC MSMT LEADCHNL RA PACING THRESHOLD AMPLITUDE: 0.875 V
MDC IDC MSMT LEADCHNL RA SENSING INTR AMPL: 2.25 mV
MDC IDC MSMT LEADCHNL RV IMPEDANCE VALUE: 380 Ohm
MDC IDC MSMT LEADCHNL RV PACING THRESHOLD PULSEWIDTH: 0.4 ms
MDC IDC MSMT LEADCHNL RV SENSING INTR AMPL: 19.5 mV
MDC IDC SET LEADCHNL LV PACING AMPLITUDE: 6 V
MDC IDC SET LEADCHNL LV PACING PULSEWIDTH: 0.8 ms
MDC IDC SET LEADCHNL RA PACING AMPLITUDE: 1.75 V
MDC IDC SET ZONE DETECTION INTERVAL: 300 ms
MDC IDC SET ZONE DETECTION INTERVAL: 450 ms
MDC IDC STAT BRADY AP VS PERCENT: 0.23 %
MDC IDC STAT BRADY AS VP PERCENT: 84.53 %
MDC IDC STAT BRADY RA PERCENT PACED: 13.76 %
Zone Setting Detection Interval: 340 ms
Zone Setting Detection Interval: 350 ms

## 2014-10-05 ENCOUNTER — Encounter: Payer: Self-pay | Admitting: Internal Medicine

## 2014-10-05 ENCOUNTER — Ambulatory Visit (INDEPENDENT_AMBULATORY_CARE_PROVIDER_SITE_OTHER): Payer: Commercial Managed Care - HMO | Admitting: Internal Medicine

## 2014-10-05 VITALS — BP 140/76 | HR 75 | Ht 64.0 in | Wt 142.0 lb

## 2014-10-05 DIAGNOSIS — T82110A Breakdown (mechanical) of cardiac electrode, initial encounter: Secondary | ICD-10-CM | POA: Diagnosis not present

## 2014-10-05 DIAGNOSIS — I255 Ischemic cardiomyopathy: Secondary | ICD-10-CM

## 2014-10-05 DIAGNOSIS — Z95810 Presence of automatic (implantable) cardiac defibrillator: Secondary | ICD-10-CM

## 2014-10-05 DIAGNOSIS — I5022 Chronic systolic (congestive) heart failure: Secondary | ICD-10-CM | POA: Diagnosis not present

## 2014-10-05 LAB — CUP PACEART INCLINIC DEVICE CHECK
Battery Remaining Longevity: 38 mo
Battery Voltage: 2.94 V
Brady Statistic AP VP Percent: 13.37 %
Brady Statistic AS VS Percent: 1.72 %
Brady Statistic RA Percent Paced: 13.59 %
Brady Statistic RV Percent Paced: 40.49 %
HIGH POWER IMPEDANCE MEASURED VALUE: 209 Ohm
HIGH POWER IMPEDANCE MEASURED VALUE: 67 Ohm
Lead Channel Pacing Threshold Amplitude: 0.75 V
Lead Channel Pacing Threshold Amplitude: 0.75 V
Lead Channel Pacing Threshold Amplitude: 3.5 V
Lead Channel Pacing Threshold Pulse Width: 0.4 ms
Lead Channel Sensing Intrinsic Amplitude: 2.375 mV
Lead Channel Sensing Intrinsic Amplitude: 31.625 mV
Lead Channel Setting Pacing Amplitude: 2 V
Lead Channel Setting Pacing Pulse Width: 0.4 ms
Lead Channel Setting Sensing Sensitivity: 0.3 mV
MDC IDC MSMT LEADCHNL LV PACING THRESHOLD PULSEWIDTH: 0.8 ms
MDC IDC MSMT LEADCHNL RA IMPEDANCE VALUE: 494 Ohm
MDC IDC MSMT LEADCHNL RV IMPEDANCE VALUE: 494 Ohm
MDC IDC MSMT LEADCHNL RV PACING THRESHOLD PULSEWIDTH: 0.4 ms
MDC IDC SESS DTM: 20160512170939
MDC IDC SET LEADCHNL LV PACING AMPLITUDE: 4 V
MDC IDC SET LEADCHNL LV PACING PULSEWIDTH: 0.8 ms
MDC IDC SET LEADCHNL RV PACING AMPLITUDE: 2 V
MDC IDC SET ZONE DETECTION INTERVAL: 350 ms
MDC IDC SET ZONE DETECTION INTERVAL: 450 ms
MDC IDC STAT BRADY AP VS PERCENT: 0.22 %
MDC IDC STAT BRADY AS VP PERCENT: 84.69 %
Zone Setting Detection Interval: 300 ms
Zone Setting Detection Interval: 340 ms

## 2014-10-05 NOTE — Assessment & Plan Note (Signed)
She denies anginal symptoms. No change in medications. She is encouraged to increase her activity.

## 2014-10-05 NOTE — Patient Instructions (Addendum)
Medication Instructions:  Your physician recommends that you continue on your current medications as directed. Please refer to the Current Medication list given to you today.   Labwork: NONE  Testing/Procedures: A chest x-ray takes a picture of the organs and structures inside the chest, including the heart, lungs, and blood vessels. This test can show several things, including, whether the heart is enlarges; whether fluid is building up in the lungs; and whether pacemaker / defibrillator leads are still in place.  [facility], [place] [id number]  Follow-Up: Remote monitoring is used to monitor your Pacemaker or ICD from home. This monitoring reduces the number of office visits required to check your device to one time per year. It allows [place] to keep an eye on the functioning of your device to ensure it is working properly. You are scheduled for a device check from home on 01/04/2015. You may send your transmission at any time that day. If you have a wireless device, the transmission will be sent automatically. After your physician reviews your transmission, you will receive a postcard with your next transmission date.  Your physician wants you to follow-up in: 6 months with Dr. Lovena Le. You will receive a reminder letter in the mail two months in advance. If you don't receive a letter, please call our office to schedule the follow-up appointment.   Any Other Special Instructions Will Be Listed Below (If Applicable).

## 2014-10-05 NOTE — Assessment & Plan Note (Signed)
Her symptoms are class 2. She will continue her current meds. 

## 2014-10-05 NOTE — Assessment & Plan Note (Signed)
Today we reprogrammed her device. At the distal electrode she has diaphragmatic stimulation. At M2 she does not but her threshold is increased though not as bad as P4. Will recheck in several months and check a cxr to look for lead migration.

## 2014-10-05 NOTE — Progress Notes (Signed)
HPI Kathryn Dixon returns today for followup. She is a pleasant 69 yo woman with a ICM, chronic systolic heart failure, LBBB, s/p BiV ICD. In the interim, she has done well. Since she broke her foot, he has been sedentary. No chest pain. No ICD shock. She was noted to have an increase in her LV pacing threshold approx 5 months ago. She returns today for recheck. The patient feels well. Her CHF symptoms are class 2A.   No Known Allergies   Current Outpatient Prescriptions  Medication Sig Dispense Refill  . aspirin EC 81 MG tablet Take 81 mg by mouth daily.    . hydrochlorothiazide (MICROZIDE) 12.5 MG capsule Take 1 capsule (12.5 mg total) by mouth daily. 90 capsule 3  . lisinopril (PRINIVIL,ZESTRIL) 5 MG tablet Take 1 tablet (5 mg total) by mouth daily. 90 tablet 3  . metoprolol tartrate (LOPRESSOR) 25 MG tablet Take 1 tablet (25 mg total) by mouth 2 (two) times daily. 180 tablet 3   No current facility-administered medications for this visit.     Past Medical History  Diagnosis Date  . Hypertension   . Coronary artery disease   . LBBB (left bundle branch block)   . Ischemic cardiomyopathy   . Thoracic aneurysm without mention of rupture   . Smoker   . S/P CABG x 2 - LIMA-LAD, SVG-OM, 01/27/13 01/28/2013  . Myocardial infarction 1990's;01/2013    ; "mild, right before OHS"  . Exertional shortness of breath     ROS:   All systems reviewed and negative except as noted in the HPI.   Past Surgical History  Procedure Laterality Date  . Abdominal hysterectomy    . Fracture surgery    . Coronary artery bypass graft N/A 01/27/2013    Procedure: CORONARY ARTERY BYPASS GRAFTING times two using Right Greater Saphenous Vein Graft Harvsted Endoscopically and Left Internal Mammary Artery;  Surgeon: Gaye Pollack, MD;  Location: [place] OR;  Service: Open Heart Surgery;  Laterality: N/A;  . Intraoperative transesophageal echocardiogram N/A 01/27/2013    Procedure: INTRAOPERATIVE TRANSESOPHAGEAL  ECHOCARDIOGRAM;  Surgeon: Gaye Pollack, MD;  Location: [facility] OR;  Service: Open Heart Surgery;  Laterality: N/A;  . Dilation and curettage of uterus    . Tubal ligation    . Left heart catheterization with coronary angiogram N/A 01/20/2013    Procedure: LEFT HEART CATHETERIZATION WITH CORONARY ANGIOGRAM;  Surgeon: Leonie Man, MD;  Location: [facility] CATH LAB;  Service: Cardiovascular;  Laterality: N/A;  . Right heart catheterization  01/20/2013    Procedure: RIGHT HEART CATH;  Surgeon: Leonie Man, MD;  Location: [facility] CATH LAB;  Service: Cardiovascular;;  . Bi-ventricular implantable cardioverter defibrillator N/A 06/01/2013    Procedure: BI-VENTRICULAR IMPLANTABLE CARDIOVERTER DEFIBRILLATOR  (CRT-D);  Surgeon: Evans Lance, MD;  Location: [facility] CATH LAB;  Service: Cardiovascular;  Laterality: N/A;     Family History  Problem Relation Age of Onset  . Hypertension Mother   . Heart disease Father      History   Social History  . Marital Status: Divorced    Spouse Name: N/A  . Number of Children: N/A  . Years of Education: N/A   Occupational History  . Not on file.   Social History Main Topics  . Smoking status: Current Some Day Smoker -- 0.12 packs/day for 50 years    Types: Cigarettes  . Smokeless tobacco: Never Used  . Alcohol Use: Yes     Comment: 06/01/2013 "  couple times/yr I'll have 1-2 drinks"  . Drug Use: No  . Sexual Activity: Not Currently   Other Topics Concern  . Not on file   Social History Narrative     BP 140/76 mmHg  Pulse 75  Ht 5\' 4"  (1.626 m)  Wt 142 lb (64.411 kg)  BMI 24.36 kg/m2  Physical Exam:  Well appearing 69 yo woman, NAD HEENT: Unremarkable Neck:  7 cm JVD, no thyromegally Back:  No CVA tenderness Lungs:  Clear with no wheezes or rhonchi. Well healed ICD incision. HEART:  Regular rate rhythm, no murmurs, no rubs, no clicks Abd:  soft, positive bowel sounds, no organomegally, no rebound, no guarding Ext:  2 plus pulses, no edema, no  cyanosis, no clubbing, left foot in a soft cast Skin:  No rashes no nodules Neuro:  CN II through XII intact, motor grossly intact   DEVICE  Normal device function.  See PaceArt for details.   Assess/Plan:

## 2014-10-10 ENCOUNTER — Ambulatory Visit
Admission: RE | Admit: 2014-10-10 | Discharge: 2014-10-10 | Disposition: A | Discharge status: Home / Self Care | Payer: Medicaid Other | Source: Ambulatory Visit | Attending: Internal Medicine | Admitting: Internal Medicine

## 2014-10-10 DIAGNOSIS — T82110A Breakdown (mechanical) of cardiac electrode, initial encounter: Secondary | ICD-10-CM

## 2015-01-04 ENCOUNTER — Ambulatory Visit (INDEPENDENT_AMBULATORY_CARE_PROVIDER_SITE_OTHER): Payer: Commercial Managed Care - HMO | Admitting: *Deleted

## 2015-01-04 DIAGNOSIS — I255 Ischemic cardiomyopathy: Secondary | ICD-10-CM | POA: Diagnosis not present

## 2015-01-04 DIAGNOSIS — I5022 Chronic systolic (congestive) heart failure: Secondary | ICD-10-CM

## 2015-01-04 NOTE — Progress Notes (Signed)
Remote ICD transmission.   

## 2015-01-10 LAB — CUP PACEART REMOTE DEVICE CHECK
Battery Remaining Longevity: 36 mo
Battery Voltage: 2.96 V
Brady Statistic AP VP Percent: 6.47 %
Brady Statistic AS VP Percent: 91.59 %
Brady Statistic RV Percent Paced: 38.67 %
HighPow Impedance: 64 Ohm
Lead Channel Impedance Value: 437 Ohm
Lead Channel Impedance Value: 437 Ohm
Lead Channel Impedance Value: 494 Ohm
Lead Channel Impedance Value: 532 Ohm
Lead Channel Impedance Value: 627 Ohm
Lead Channel Impedance Value: 760 Ohm
Lead Channel Impedance Value: 969 Ohm
Lead Channel Impedance Value: 969 Ohm
Lead Channel Pacing Threshold Amplitude: 0.875 V
Lead Channel Pacing Threshold Amplitude: 0.875 V
Lead Channel Pacing Threshold Amplitude: 5.5 V
Lead Channel Pacing Threshold Pulse Width: 0.4 ms
Lead Channel Pacing Threshold Pulse Width: 0.8 ms
Lead Channel Sensing Intrinsic Amplitude: 2 mV
Lead Channel Sensing Intrinsic Amplitude: 31.625 mV
Lead Channel Setting Pacing Amplitude: 2 V
Lead Channel Setting Pacing Pulse Width: 0.4 ms
MDC IDC MSMT LEADCHNL LV IMPEDANCE VALUE: 399 Ohm
MDC IDC MSMT LEADCHNL LV IMPEDANCE VALUE: 722 Ohm
MDC IDC MSMT LEADCHNL LV IMPEDANCE VALUE: 950 Ohm
MDC IDC MSMT LEADCHNL RA PACING THRESHOLD PULSEWIDTH: 0.4 ms
MDC IDC MSMT LEADCHNL RA SENSING INTR AMPL: 2 mV
MDC IDC MSMT LEADCHNL RV IMPEDANCE VALUE: 342 Ohm
MDC IDC MSMT LEADCHNL RV IMPEDANCE VALUE: 437 Ohm
MDC IDC MSMT LEADCHNL RV SENSING INTR AMPL: 31.625 mV
MDC IDC SESS DTM: 20160811130703
MDC IDC SET LEADCHNL LV PACING AMPLITUDE: 4 V
MDC IDC SET LEADCHNL LV PACING PULSEWIDTH: 0.8 ms
MDC IDC SET LEADCHNL RA PACING AMPLITUDE: 1.75 V
MDC IDC SET LEADCHNL RV SENSING SENSITIVITY: 0.3 mV
MDC IDC SET ZONE DETECTION INTERVAL: 340 ms
MDC IDC SET ZONE DETECTION INTERVAL: 450 ms
MDC IDC STAT BRADY AP VS PERCENT: 0.09 %
MDC IDC STAT BRADY AS VS PERCENT: 1.86 %
MDC IDC STAT BRADY RA PERCENT PACED: 6.56 %
Zone Setting Detection Interval: 300 ms
Zone Setting Detection Interval: 350 ms

## 2015-01-25 ENCOUNTER — Encounter: Payer: Self-pay | Admitting: Cardiology

## 2015-02-05 ENCOUNTER — Encounter: Payer: Self-pay | Admitting: Internal Medicine

## 2015-03-14 ENCOUNTER — Encounter: Payer: Self-pay | Admitting: Internal Medicine

## 2015-04-05 ENCOUNTER — Encounter: Payer: Medicaid Other | Admitting: Internal Medicine

## 2015-05-03 ENCOUNTER — Encounter: Payer: Commercial Managed Care - HMO | Admitting: Internal Medicine

## 2015-06-05 ENCOUNTER — Encounter: Payer: Self-pay | Admitting: Internal Medicine

## 2015-06-05 ENCOUNTER — Ambulatory Visit (INDEPENDENT_AMBULATORY_CARE_PROVIDER_SITE_OTHER): Payer: Medicare Other | Admitting: Internal Medicine

## 2015-06-05 VITALS — BP 146/100 | HR 60 | Ht 64.0 in | Wt 140.0 lb

## 2015-06-05 DIAGNOSIS — I255 Ischemic cardiomyopathy: Secondary | ICD-10-CM | POA: Diagnosis not present

## 2015-06-05 DIAGNOSIS — I214 Non-ST elevation (NSTEMI) myocardial infarction: Secondary | ICD-10-CM

## 2015-06-05 DIAGNOSIS — Z95810 Presence of automatic (implantable) cardiac defibrillator: Secondary | ICD-10-CM

## 2015-06-05 DIAGNOSIS — I5022 Chronic systolic (congestive) heart failure: Secondary | ICD-10-CM | POA: Diagnosis not present

## 2015-06-05 LAB — CUP PACEART INCLINIC DEVICE CHECK
Battery Voltage: 2.95 V
Brady Statistic AP VP Percent: 11.2 %
Brady Statistic AP VS Percent: 0.18 %
Brady Statistic AS VP Percent: 86.55 %
HIGH POWER IMPEDANCE MEASURED VALUE: 65 Ohm
Implantable Lead Implant Date: 20150107
Implantable Lead Model: 6935
Lead Channel Impedance Value: 399 Ohm
Lead Channel Impedance Value: 456 Ohm
Lead Channel Impedance Value: 456 Ohm
Lead Channel Impedance Value: 494 Ohm
Lead Channel Impedance Value: 722 Ohm
Lead Channel Impedance Value: 950 Ohm
Lead Channel Impedance Value: 969 Ohm
Lead Channel Pacing Threshold Amplitude: 0.75 V
Lead Channel Pacing Threshold Amplitude: 2 V
Lead Channel Pacing Threshold Pulse Width: 0.4 ms
Lead Channel Pacing Threshold Pulse Width: 0.8 ms
Lead Channel Sensing Intrinsic Amplitude: 2.625 mV
Lead Channel Setting Pacing Amplitude: 3 V
Lead Channel Setting Pacing Pulse Width: 0.8 ms
Lead Channel Setting Sensing Sensitivity: 0.3 mV
MDC IDC LEAD IMPLANT DT: 20150107
MDC IDC LEAD IMPLANT DT: 20150107
MDC IDC LEAD LOCATION: 753857
MDC IDC LEAD LOCATION: 753859
MDC IDC LEAD LOCATION: 753860
MDC IDC LEAD MODEL: 4298
MDC IDC MSMT BATTERY REMAINING LONGEVITY: 36 mo
MDC IDC MSMT LEADCHNL LV IMPEDANCE VALUE: 532 Ohm
MDC IDC MSMT LEADCHNL LV IMPEDANCE VALUE: 589 Ohm
MDC IDC MSMT LEADCHNL LV IMPEDANCE VALUE: 760 Ohm
MDC IDC MSMT LEADCHNL LV IMPEDANCE VALUE: 912 Ohm
MDC IDC MSMT LEADCHNL RA PACING THRESHOLD AMPLITUDE: 0.75 V
MDC IDC MSMT LEADCHNL RA PACING THRESHOLD PULSEWIDTH: 0.4 ms
MDC IDC MSMT LEADCHNL RV IMPEDANCE VALUE: 342 Ohm
MDC IDC MSMT LEADCHNL RV IMPEDANCE VALUE: 456 Ohm
MDC IDC MSMT LEADCHNL RV SENSING INTR AMPL: 31.625 mV
MDC IDC SESS DTM: 20170110125815
MDC IDC SET LEADCHNL RA PACING AMPLITUDE: 1.5 V
MDC IDC STAT BRADY AS VS PERCENT: 2.07 %
MDC IDC STAT BRADY RA PERCENT PACED: 11.39 %
MDC IDC STAT BRADY RV PERCENT PACED: 38.75 %

## 2015-06-05 NOTE — Assessment & Plan Note (Signed)
She denies anginal symptoms. No change in her meds.  

## 2015-06-05 NOTE — Assessment & Plan Note (Signed)
Her symptoms remain class 2. She will continue her current meds. 

## 2015-06-05 NOTE — Assessment & Plan Note (Signed)
Her biv device is working normally. Will follow.

## 2015-06-05 NOTE — Patient Instructions (Signed)

## 2015-06-05 NOTE — Progress Notes (Signed)
HPI Mrs. Heringer returns today for followup. She is a pleasant 70 yo woman with a ICM, chronic systolic heart failure, LBBB, s/p BiV ICD. In the interim, she has done well. Since she broke her foot, he has been sedentary. No chest pain. No ICD shock. She was noted to have an increase in her LV pacing threshold approx 5 months ago. She returns today for recheck. The patient feels well. Her CHF symptoms are class 2A. No syncope.  No Known Allergies   Current Outpatient Prescriptions  Medication Sig Dispense Refill  . aspirin EC 81 MG tablet Take 81 mg by mouth daily.    . hydrochlorothiazide (MICROZIDE) 12.5 MG capsule Take 1 capsule (12.5 mg total) by mouth daily. 90 capsule 3  . lisinopril (PRINIVIL,ZESTRIL) 5 MG tablet Take 1 tablet (5 mg total) by mouth daily. 90 tablet 3  . metoprolol tartrate (LOPRESSOR) 25 MG tablet Take 1 tablet (25 mg total) by mouth 2 (two) times daily. 180 tablet 3   No current facility-administered medications for this visit.     Past Medical History  Diagnosis Date  . Hypertension   . Coronary artery disease   . LBBB (left bundle branch block)   . Ischemic cardiomyopathy   . Thoracic aneurysm without mention of rupture   . Smoker   . S/P CABG x 2 - LIMA-LAD, SVG-OM, 01/27/13 01/28/2013  . Myocardial infarction ([place]) 1990's;01/2013    ; "mild, right before OHS"  . Exertional shortness of breath     ROS:   All systems reviewed and negative except as noted in the HPI.   Past Surgical History  Procedure Laterality Date  . Abdominal hysterectomy    . Fracture surgery    . Coronary artery bypass graft N/A 01/27/2013    Procedure: CORONARY ARTERY BYPASS GRAFTING times two using Right Greater Saphenous Vein Graft Harvsted Endoscopically and Left Internal Mammary Artery;  Surgeon: Gaye Pollack, MD;  Location: [place] OR;  Service: Open Heart Surgery;  Laterality: N/A;  . Intraoperative transesophageal echocardiogram N/A 01/27/2013    Procedure:  INTRAOPERATIVE TRANSESOPHAGEAL ECHOCARDIOGRAM;  Surgeon: Gaye Pollack, MD;  Location: [facility] OR;  Service: Open Heart Surgery;  Laterality: N/A;  . Dilation and curettage of uterus    . Tubal ligation    . Left heart catheterization with coronary angiogram N/A 01/20/2013    Procedure: LEFT HEART CATHETERIZATION WITH CORONARY ANGIOGRAM;  Surgeon: Leonie Man, MD;  Location: [facility] CATH LAB;  Service: Cardiovascular;  Laterality: N/A;  . Right heart catheterization  01/20/2013    Procedure: RIGHT HEART CATH;  Surgeon: Leonie Man, MD;  Location: [facility] CATH LAB;  Service: Cardiovascular;;  . Bi-ventricular implantable cardioverter defibrillator N/A 06/01/2013    Procedure: BI-VENTRICULAR IMPLANTABLE CARDIOVERTER DEFIBRILLATOR  (CRT-D);  Surgeon: Evans Lance, MD;  Location: [facility] CATH LAB;  Service: Cardiovascular;  Laterality: N/A;     Family History  Problem Relation Age of Onset  . Hypertension Mother   . Heart disease Father      Social History   Social History  . Marital Status: Divorced    Spouse Name: N/A  . Number of Children: N/A  . Years of Education: N/A   Occupational History  . Not on file.   Social History Main Topics  . Smoking status: Current Some Day Smoker -- 0.12 packs/day for 50 years    Types: Cigarettes  . Smokeless tobacco: Never Used  . Alcohol Use: Yes  Comment: 06/01/2013 "couple times/yr I'll have 1-2 drinks"  . Drug Use: No  . Sexual Activity: Not Currently   Other Topics Concern  . Not on file   Social History Narrative     BP 146/100 mmHg  Pulse 60  Ht 5\' 4"  (1.626 m)  Wt 140 lb (63.504 kg)  BMI 24.02 kg/m2  Physical Exam:  Well appearing 70 yo woman, NAD HEENT: Unremarkable Neck:  7 cm JVD, no thyromegally Back:  No CVA tenderness Lungs:  Clear with no wheezes or rhonchi. Well healed ICD incision. HEART:  Regular rate rhythm, no murmurs, no rubs, no clicks Abd:  soft, positive bowel sounds, no organomegally, no rebound, no  guarding Ext:  2 plus pulses, no edema, no cyanosis, no clubbing, left foot in a soft cast Skin:  No rashes no nodules Neuro:  CN II through XII intact, motor grossly intact   DEVICE  Normal device function.  See PaceArt for details.   Assess/Plan:

## 2015-06-06 ENCOUNTER — Other Ambulatory Visit: Payer: Self-pay | Admitting: Internal Medicine

## 2015-07-11 ENCOUNTER — Other Ambulatory Visit: Payer: Self-pay

## 2015-07-11 DIAGNOSIS — I5022 Chronic systolic (congestive) heart failure: Secondary | ICD-10-CM

## 2015-07-11 MED ORDER — METOPROLOL TARTRATE 25 MG PO TABS: 25.0000 mg | ORAL_TABLET | Freq: Two times a day (BID) | ORAL | Status: DC

## 2015-09-04 ENCOUNTER — Ambulatory Visit (INDEPENDENT_AMBULATORY_CARE_PROVIDER_SITE_OTHER): Payer: Medicare Other | Admitting: *Deleted

## 2015-09-04 DIAGNOSIS — I255 Ischemic cardiomyopathy: Secondary | ICD-10-CM | POA: Diagnosis not present

## 2015-09-04 DIAGNOSIS — Z95810 Presence of automatic (implantable) cardiac defibrillator: Secondary | ICD-10-CM

## 2015-09-04 NOTE — Progress Notes (Signed)
Remote ICD transmission.   

## 2015-09-25 LAB — CUP PACEART REMOTE DEVICE CHECK
Battery Remaining Longevity: 34 mo
Brady Statistic AP VP Percent: 11.35 %
Brady Statistic AP VS Percent: 0.19 %
Brady Statistic AS VP Percent: 86.73 %
Brady Statistic AS VS Percent: 1.74 %
Brady Statistic RA Percent Paced: 11.54 %
Brady Statistic RV Percent Paced: 35.76 %
HIGH POWER IMPEDANCE MEASURED VALUE: 64 Ohm
Implantable Lead Implant Date: 20150107
Implantable Lead Implant Date: 20150107
Implantable Lead Location: 753857
Implantable Lead Location: 753860
Implantable Lead Model: 6935
Lead Channel Impedance Value: 380 Ohm
Lead Channel Impedance Value: 456 Ohm
Lead Channel Impedance Value: 494 Ohm
Lead Channel Impedance Value: 494 Ohm
Lead Channel Impedance Value: 570 Ohm
Lead Channel Impedance Value: 627 Ohm
Lead Channel Impedance Value: 722 Ohm
Lead Channel Impedance Value: 760 Ohm
Lead Channel Impedance Value: 969 Ohm
Lead Channel Pacing Threshold Amplitude: 0.625 V
Lead Channel Pacing Threshold Amplitude: 0.875 V
Lead Channel Pacing Threshold Pulse Width: 0.4 ms
Lead Channel Sensing Intrinsic Amplitude: 2.375 mV
Lead Channel Sensing Intrinsic Amplitude: 31.625 mV
Lead Channel Sensing Intrinsic Amplitude: 31.625 mV
Lead Channel Setting Pacing Amplitude: 2 V
Lead Channel Setting Pacing Amplitude: 3 V
Lead Channel Setting Sensing Sensitivity: 0.3 mV
MDC IDC LEAD IMPLANT DT: 20150107
MDC IDC LEAD LOCATION: 753859
MDC IDC LEAD MODEL: 4298
MDC IDC MSMT BATTERY VOLTAGE: 2.96 V
MDC IDC MSMT LEADCHNL LV IMPEDANCE VALUE: 399 Ohm
MDC IDC MSMT LEADCHNL LV IMPEDANCE VALUE: 893 Ohm
MDC IDC MSMT LEADCHNL LV IMPEDANCE VALUE: 950 Ohm
MDC IDC MSMT LEADCHNL RA PACING THRESHOLD PULSEWIDTH: 0.4 ms
MDC IDC MSMT LEADCHNL RA SENSING INTR AMPL: 2.375 mV
MDC IDC MSMT LEADCHNL RV IMPEDANCE VALUE: 456 Ohm
MDC IDC SESS DTM: 20170411062726
MDC IDC SET LEADCHNL LV PACING PULSEWIDTH: 0.8 ms
MDC IDC SET LEADCHNL RA PACING AMPLITUDE: 1.5 V
MDC IDC SET LEADCHNL RV PACING PULSEWIDTH: 0.4 ms

## 2015-10-03 ENCOUNTER — Encounter: Payer: Self-pay | Admitting: Cardiology

## 2015-12-04 ENCOUNTER — Ambulatory Visit (INDEPENDENT_AMBULATORY_CARE_PROVIDER_SITE_OTHER): Payer: Medicare Other | Admitting: *Deleted

## 2015-12-04 DIAGNOSIS — I255 Ischemic cardiomyopathy: Secondary | ICD-10-CM | POA: Diagnosis not present

## 2015-12-04 DIAGNOSIS — Z95810 Presence of automatic (implantable) cardiac defibrillator: Secondary | ICD-10-CM | POA: Diagnosis not present

## 2015-12-04 NOTE — Progress Notes (Signed)
Remote ICD transmission.   

## 2015-12-07 ENCOUNTER — Encounter: Payer: Self-pay | Admitting: Cardiology

## 2015-12-07 LAB — CUP PACEART REMOTE DEVICE CHECK
Battery Voltage: 2.95 V
Brady Statistic AP VP Percent: 3.42 %
Brady Statistic AS VP Percent: 94.89 %
Brady Statistic AS VS Percent: 1.63 %
Brady Statistic RV Percent Paced: 33.05 %
HighPow Impedance: 65 Ohm
Implantable Lead Implant Date: 20150107
Implantable Lead Location: 753859
Implantable Lead Model: 5076
Implantable Lead Model: 6935
Lead Channel Impedance Value: 399 Ohm
Lead Channel Impedance Value: 456 Ohm
Lead Channel Impedance Value: 456 Ohm
Lead Channel Impedance Value: 494 Ohm
Lead Channel Impedance Value: 627 Ohm
Lead Channel Impedance Value: 912 Ohm
Lead Channel Impedance Value: 950 Ohm
Lead Channel Pacing Threshold Amplitude: 0.875 V
Lead Channel Pacing Threshold Pulse Width: 0.4 ms
Lead Channel Pacing Threshold Pulse Width: 0.4 ms
Lead Channel Sensing Intrinsic Amplitude: 2.125 mV
Lead Channel Sensing Intrinsic Amplitude: 2.125 mV
Lead Channel Sensing Intrinsic Amplitude: 31.625 mV
Lead Channel Sensing Intrinsic Amplitude: 31.625 mV
Lead Channel Setting Pacing Amplitude: 1.75 V
Lead Channel Setting Pacing Amplitude: 2 V
Lead Channel Setting Pacing Amplitude: 3 V
Lead Channel Setting Pacing Pulse Width: 0.4 ms
Lead Channel Setting Pacing Pulse Width: 0.8 ms
MDC IDC LEAD IMPLANT DT: 20150107
MDC IDC LEAD IMPLANT DT: 20150107
MDC IDC LEAD LOCATION: 753857
MDC IDC LEAD LOCATION: 753860
MDC IDC LEAD MODEL: 4298
MDC IDC MSMT BATTERY REMAINING LONGEVITY: 32 mo
MDC IDC MSMT LEADCHNL LV IMPEDANCE VALUE: 570 Ohm
MDC IDC MSMT LEADCHNL LV IMPEDANCE VALUE: 760 Ohm
MDC IDC MSMT LEADCHNL LV IMPEDANCE VALUE: 760 Ohm
MDC IDC MSMT LEADCHNL LV IMPEDANCE VALUE: 950 Ohm
MDC IDC MSMT LEADCHNL LV PACING THRESHOLD AMPLITUDE: 5.5 V
MDC IDC MSMT LEADCHNL LV PACING THRESHOLD PULSEWIDTH: 0.8 ms
MDC IDC MSMT LEADCHNL RA PACING THRESHOLD AMPLITUDE: 0.75 V
MDC IDC MSMT LEADCHNL RV IMPEDANCE VALUE: 380 Ohm
MDC IDC MSMT LEADCHNL RV IMPEDANCE VALUE: 494 Ohm
MDC IDC SESS DTM: 20170711062828
MDC IDC SET LEADCHNL RV SENSING SENSITIVITY: 0.3 mV
MDC IDC STAT BRADY AP VS PERCENT: 0.05 %
MDC IDC STAT BRADY RA PERCENT PACED: 3.48 %

## 2016-03-04 ENCOUNTER — Ambulatory Visit (INDEPENDENT_AMBULATORY_CARE_PROVIDER_SITE_OTHER): Payer: Medicare Other | Admitting: *Deleted

## 2016-03-04 DIAGNOSIS — I255 Ischemic cardiomyopathy: Secondary | ICD-10-CM

## 2016-03-04 NOTE — Progress Notes (Signed)
Remote ICD transmission.   

## 2016-03-05 ENCOUNTER — Encounter: Payer: Self-pay | Admitting: Cardiology

## 2016-04-01 LAB — CUP PACEART REMOTE DEVICE CHECK
Battery Remaining Longevity: 28 mo
Battery Voltage: 2.94 V
Brady Statistic AP VP Percent: 1.17 %
Brady Statistic AP VS Percent: 0 %
Brady Statistic AS VS Percent: 1.33 %
Brady Statistic RA Percent Paced: 1.17 %
Date Time Interrogation Session: 20171010150800
HighPow Impedance: 64 Ohm
Implantable Lead Implant Date: 20150107
Implantable Lead Implant Date: 20150107
Implantable Lead Implant Date: 20150107
Implantable Lead Location: 753857
Implantable Lead Model: 4298
Implantable Lead Model: 6935
Implantable Pulse Generator Implant Date: 20150107
Lead Channel Impedance Value: 342 Ohm
Lead Channel Impedance Value: 399 Ohm
Lead Channel Impedance Value: 456 Ohm
Lead Channel Impedance Value: 456 Ohm
Lead Channel Impedance Value: 456 Ohm
Lead Channel Impedance Value: 532 Ohm
Lead Channel Impedance Value: 855 Ohm
Lead Channel Impedance Value: 893 Ohm
Lead Channel Impedance Value: 912 Ohm
Lead Channel Pacing Threshold Amplitude: 0.75 V
Lead Channel Pacing Threshold Pulse Width: 0.4 ms
Lead Channel Pacing Threshold Pulse Width: 0.4 ms
Lead Channel Sensing Intrinsic Amplitude: 14.5 mV
Lead Channel Sensing Intrinsic Amplitude: 3.125 mV
Lead Channel Setting Pacing Amplitude: 1.5 V
Lead Channel Setting Pacing Amplitude: 2 V
Lead Channel Setting Pacing Pulse Width: 0.4 ms
Lead Channel Setting Pacing Pulse Width: 0.8 ms
Lead Channel Setting Sensing Sensitivity: 0.3 mV
MDC IDC LEAD LOCATION: 753859
MDC IDC LEAD LOCATION: 753860
MDC IDC MSMT LEADCHNL LV IMPEDANCE VALUE: 456 Ohm
MDC IDC MSMT LEADCHNL LV IMPEDANCE VALUE: 570 Ohm
MDC IDC MSMT LEADCHNL LV IMPEDANCE VALUE: 703 Ohm
MDC IDC MSMT LEADCHNL LV IMPEDANCE VALUE: 703 Ohm
MDC IDC MSMT LEADCHNL RA SENSING INTR AMPL: 3.125 mV
MDC IDC MSMT LEADCHNL RV PACING THRESHOLD AMPLITUDE: 0.875 V
MDC IDC MSMT LEADCHNL RV SENSING INTR AMPL: 14.5 mV
MDC IDC SET LEADCHNL LV PACING AMPLITUDE: 3 V
MDC IDC STAT BRADY AS VP PERCENT: 97.5 %
MDC IDC STAT BRADY RV PERCENT PACED: 75.93 %

## 2016-06-06 ENCOUNTER — Encounter (INDEPENDENT_AMBULATORY_CARE_PROVIDER_SITE_OTHER): Payer: Self-pay

## 2016-06-06 ENCOUNTER — Ambulatory Visit (INDEPENDENT_AMBULATORY_CARE_PROVIDER_SITE_OTHER): Payer: Medicare Other | Admitting: Internal Medicine

## 2016-06-06 ENCOUNTER — Encounter: Payer: Self-pay | Admitting: Internal Medicine

## 2016-06-06 VITALS — BP 112/70 | HR 78 | Ht 64.0 in | Wt 138.4 lb

## 2016-06-06 DIAGNOSIS — I5022 Chronic systolic (congestive) heart failure: Secondary | ICD-10-CM

## 2016-06-06 DIAGNOSIS — Z79899 Other long term (current) drug therapy: Secondary | ICD-10-CM | POA: Diagnosis not present

## 2016-06-06 DIAGNOSIS — Z95810 Presence of automatic (implantable) cardiac defibrillator: Secondary | ICD-10-CM | POA: Diagnosis not present

## 2016-06-06 DIAGNOSIS — I255 Ischemic cardiomyopathy: Secondary | ICD-10-CM | POA: Diagnosis not present

## 2016-06-06 LAB — CUP PACEART INCLINIC DEVICE CHECK
Battery Voltage: 2.94 V
Brady Statistic AP VP Percent: 6.92 %
Brady Statistic AP VS Percent: 0.11 %
Brady Statistic AS VP Percent: 91.34 %
Brady Statistic RA Percent Paced: 7.01 %
Brady Statistic RV Percent Paced: 33.98 %
HighPow Impedance: 72 Ohm
Implantable Lead Implant Date: 20150107
Implantable Lead Implant Date: 20150107
Implantable Lead Location: 753859
Implantable Lead Location: 753860
Implantable Lead Model: 4298
Implantable Lead Model: 5076
Implantable Lead Model: 6935
Lead Channel Impedance Value: 399 Ohm
Lead Channel Impedance Value: 437 Ohm
Lead Channel Impedance Value: 513 Ohm
Lead Channel Impedance Value: 513 Ohm
Lead Channel Impedance Value: 513 Ohm
Lead Channel Impedance Value: 627 Ohm
Lead Channel Impedance Value: 779 Ohm
Lead Channel Impedance Value: 817 Ohm
Lead Channel Impedance Value: 988 Ohm
Lead Channel Pacing Threshold Amplitude: 1 V
Lead Channel Pacing Threshold Pulse Width: 0.4 ms
Lead Channel Pacing Threshold Pulse Width: 0.8 ms
Lead Channel Sensing Intrinsic Amplitude: 3.75 mV
Lead Channel Setting Pacing Amplitude: 2 V
Lead Channel Setting Pacing Pulse Width: 0.4 ms
MDC IDC LEAD IMPLANT DT: 20150107
MDC IDC LEAD LOCATION: 753857
MDC IDC MSMT BATTERY REMAINING LONGEVITY: 24 mo
MDC IDC MSMT LEADCHNL LV IMPEDANCE VALUE: 570 Ohm
MDC IDC MSMT LEADCHNL LV IMPEDANCE VALUE: 912 Ohm
MDC IDC MSMT LEADCHNL LV IMPEDANCE VALUE: 969 Ohm
MDC IDC MSMT LEADCHNL LV PACING THRESHOLD AMPLITUDE: 3.5 V
MDC IDC MSMT LEADCHNL RA IMPEDANCE VALUE: 513 Ohm
MDC IDC MSMT LEADCHNL RA PACING THRESHOLD AMPLITUDE: 0.75 V
MDC IDC MSMT LEADCHNL RA PACING THRESHOLD PULSEWIDTH: 0.4 ms
MDC IDC MSMT LEADCHNL RV SENSING INTR AMPL: 31.625 mV
MDC IDC PG IMPLANT DT: 20150107
MDC IDC SESS DTM: 20180112145959
MDC IDC SET LEADCHNL RV SENSING SENSITIVITY: 0.3 mV
MDC IDC STAT BRADY AS VS PERCENT: 1.63 %

## 2016-06-06 NOTE — Progress Notes (Signed)
HPI Mrs. Tyma returns today for followup. She is a pleasant 71 yo woman with a ICM, chronic systolic heart failure, LBBB, s/p BiV ICD. In the interim, she has done well. Since she broke her foot, he has been sedentary. No chest pain. No ICD shock. She was noted to have an increase in her LV pacing threshold approx 1.5 years ago. She returns today for recheck. The patient feels well. Her CHF symptoms are class 2A. No syncope.  No Known Allergies   Current Outpatient Prescriptions  Medication Sig Dispense Refill  . aspirin EC 81 MG tablet Take 81 mg by mouth daily.    . hydrochlorothiazide (MICROZIDE) 12.5 MG capsule take 1 capsule by mouth once daily 90 capsule 3  . lisinopril (PRINIVIL,ZESTRIL) 5 MG tablet take 1 tablet by mouth once daily 90 tablet 3  . metoprolol tartrate (LOPRESSOR) 25 MG tablet Take 1 tablet (25 mg total) by mouth 2 (two) times daily. 180 tablet 3  . vitamin E 200 UNIT capsule Take 200 Units by mouth daily.     No current facility-administered medications for this visit.      Past Medical History:  Diagnosis Date  . Coronary artery disease   . Exertional shortness of breath   . Hypertension   . Ischemic cardiomyopathy   . LBBB (left bundle branch block)   . Myocardial infarction 1990's;01/2013   ; "mild, right before OHS"  . S/P CABG x 2 - LIMA-LAD, SVG-OM, 01/27/13 01/28/2013  . Smoker   . Thoracic aneurysm without mention of rupture     ROS:   All systems reviewed and negative except as noted in the HPI.   Past Surgical History:  Procedure Laterality Date  . ABDOMINAL HYSTERECTOMY    . BI-VENTRICULAR IMPLANTABLE CARDIOVERTER DEFIBRILLATOR N/A 06/01/2013   Procedure: BI-VENTRICULAR IMPLANTABLE CARDIOVERTER DEFIBRILLATOR  (CRT-D);  Surgeon: Evans Lance, MD;  Location: [facility] CATH LAB;  Service: Cardiovascular;  Laterality: N/A;  . CORONARY ARTERY BYPASS GRAFT N/A 01/27/2013   Procedure: CORONARY ARTERY BYPASS GRAFTING times two using Right Greater  Saphenous Vein Graft Harvsted Endoscopically and Left Internal Mammary Artery;  Surgeon: Gaye Pollack, MD;  Location: [place] OR;  Service: Open Heart Surgery;  Laterality: N/A;  . DILATION AND CURETTAGE OF UTERUS    . FRACTURE SURGERY    . INTRAOPERATIVE TRANSESOPHAGEAL ECHOCARDIOGRAM N/A 01/27/2013   Procedure: INTRAOPERATIVE TRANSESOPHAGEAL ECHOCARDIOGRAM;  Surgeon: Gaye Pollack, MD;  Location: [facility] OR;  Service: Open Heart Surgery;  Laterality: N/A;  . LEFT HEART CATHETERIZATION WITH CORONARY ANGIOGRAM N/A 01/20/2013   Procedure: LEFT HEART CATHETERIZATION WITH CORONARY ANGIOGRAM;  Surgeon: Leonie Man, MD;  Location: [facility] CATH LAB;  Service: Cardiovascular;  Laterality: N/A;  . RIGHT HEART CATHETERIZATION  01/20/2013   Procedure: RIGHT HEART CATH;  Surgeon: Leonie Man, MD;  Location: [facility] CATH LAB;  Service: Cardiovascular;;  . TUBAL LIGATION       Family History  Problem Relation Age of Onset  . Hypertension Mother   . Heart disease Father      Social History   Social History  . Marital status: Divorced    Spouse name: N/A  . Number of children: N/A  . Years of education: N/A   Occupational History  . Not on file.   Social History Main Topics  . Smoking status: Current Some Day Smoker    Packs/day: 0.12    Years: 50.00    Types: Cigarettes  . Smokeless tobacco: Never  Used  . Alcohol use Yes     Comment: 06/01/2013 "couple times/yr I'll have 1-2 drinks"  . Drug use: No  . Sexual activity: Not Currently   Other Topics Concern  . Not on file   Social History Narrative  . No narrative on file     BP 112/70   Pulse 78   Ht 5\' 4"  (1.626 m)   Wt 138 lb 6.4 oz (62.8 kg)   SpO2 98%   BMI 23.76 kg/m   Physical Exam:  Well appearing 71 yo woman, NAD HEENT: Unremarkable Neck:  7 cm JVD, no thyromegally Back:  No CVA tenderness Lungs:  Clear with no wheezes or rhonchi. Well healed ICD incision. HEART:  Regular rate rhythm, no murmurs, no rubs, no clicks Abd:   soft, positive bowel sounds, no organomegally, no rebound, no guarding Ext:  2 plus pulses, no edema, no cyanosis, no clubbing, left foot in a soft cast Skin:  No rashes no nodules Neuro:  CN II through XII intact, motor grossly intact   DEVICE  Normal device function.  See PaceArt for details.   Assess/Plan: 1. Chronic systolic heart failure - she feels well. She is class 2. She will continue her current meds. 2. ICD - she has had an increase in her LV threshold. Will see if she feels any different by reprogramming her to VVI at 40/min and let her conduct. She will call us if she feels worse. 3. Tobacco abuse - she is encouraged to not smoke.  Mikle Bosworth.D.

## 2016-06-06 NOTE — Patient Instructions (Addendum)
Medication Instructions:    Your physician recommends that you continue on your current medications as directed. Please refer to the Current Medication list given to you today.  --- If you need a refill on your cardiac medications before your next appointment, please call your pharmacy. ---  Labwork:  BMET today  Testing/Procedures:  None ordered  Follow-Up: Remote monitoring is used to monitor your Pacemaker of ICD from home. This monitoring reduces the number of office visits required to check your device to one time per year. It allows [place] to keep an eye on the functioning of your device to ensure it is working properly. You are scheduled for a device check from home on 09/08/2016. You may send your transmission at any time that day. If you have a wireless device, the transmission will be sent automatically. After your physician reviews your transmission, you will receive a postcard with your next transmission date.   Your physician wants you to follow-up in: 1 year with Dr. Lovena Le.  You will receive a reminder letter in the mail two months in advance. If you don't receive a letter, please call our office to schedule the follow-up appointment.   Any Other Special Instructions Will Be Listed Below (If Applicable).  Please call the office if you feel worse after we made changes to your device today.  Thank you for choosing [facility]!!

## 2016-06-07 ENCOUNTER — Other Ambulatory Visit: Payer: Self-pay | Admitting: Internal Medicine

## 2016-06-07 LAB — BASIC METABOLIC PANEL
BUN / CREAT RATIO: 26 (ref 12–28)
BUN: 32 mg/dL — ABNORMAL HIGH (ref 8–27)
CO2: 22 mmol/L (ref 18–29)
CREATININE: 1.22 mg/dL — AB (ref 0.57–1.00)
Calcium: 9.8 mg/dL (ref 8.7–10.3)
Chloride: 99 mmol/L (ref 96–106)
GFR calc non Af Amer: 45 mL/min/{1.73_m2} — ABNORMAL LOW (ref >59)
GFR, EST AFRICAN AMERICAN: 52 mL/min/{1.73_m2} — AB (ref >59)
Glucose: 95 mg/dL (ref 65–99)
POTASSIUM: 4.1 mmol/L (ref 3.5–5.2)
SODIUM: 142 mmol/L (ref 134–144)

## 2016-06-18 ENCOUNTER — Other Ambulatory Visit: Payer: Self-pay | Admitting: Internal Medicine

## 2016-06-18 NOTE — Telephone Encounter (Signed)
Medication Detail    Disp Refills Start End   lisinopril (PRINIVIL,ZESTRIL) 5 MG tablet 90 tablet 3 06/09/2016    Sig: take 1 tablet by mouth once daily   E-Prescribing Status: Receipt confirmed by pharmacy (06/09/2016 12:44 PM EST)   Pharmacy   [id number], [place]

## 2016-06-20 ENCOUNTER — Encounter: Payer: Self-pay | Admitting: Gastroenterology

## 2016-07-24 ENCOUNTER — Ambulatory Visit (INDEPENDENT_AMBULATORY_CARE_PROVIDER_SITE_OTHER): Payer: Medicare Other | Admitting: Gastroenterology

## 2016-07-24 ENCOUNTER — Encounter: Payer: Self-pay | Admitting: Gastroenterology

## 2016-07-24 VITALS — BP 116/70 | HR 84 | Ht 64.0 in | Wt 143.5 lb

## 2016-07-24 DIAGNOSIS — R195 Other fecal abnormalities: Secondary | ICD-10-CM | POA: Diagnosis not present

## 2016-07-24 DIAGNOSIS — Z1211 Encounter for screening for malignant neoplasm of colon: Secondary | ICD-10-CM | POA: Diagnosis not present

## 2016-07-24 NOTE — Patient Instructions (Signed)
If you are age 72 or older, your body mass index should be between 23-30. Your Body mass index is 24.63 kg/m. If this is out of the aforementioned range listed, please consider follow up with your Primary Care Provider.  If you are age 43 or younger, your body mass index should be between 19-25. Your Body mass index is 24.63 kg/m. If this is out of the aformentioned range listed, please consider follow up with your Primary Care Provider.   We will contact Dr Lovena Le to discuss endoscopy procedure and then call you with the response.  Thank you.

## 2016-07-24 NOTE — Progress Notes (Signed)
HPI :  71 y/o female with a history of CAD s/p CABG in 2014, ischemic cardiomyopathy with ICD in place, last EF 20-25% in 2014, LBBB, here for a new patient visit, referred for blood in the stools . Seen by Cardiology in 06/06/2016 (Dr. Lovena Le).   She reports she had a positive test for occult blood (report not available during visit today). She does not see any blood in the stools. She denies any problems with her bowels. No constipation or diarrhea. No abdominal pains. No FH of colon cancer. No prior colonoscopy.   She denies any cardiopulmonary symptoms. She is a current smoker, about 1/2 PPD. No anticoagulants.  Echocardiogram 04/2013 - EF - 20-25%  Past Medical History:  Diagnosis Date  . Coronary artery disease   . Exertional shortness of breath   . Hypertension   . Ischemic cardiomyopathy   . LBBB (left bundle branch block)   . Myocardial infarction 1990's;01/2013   ; "mild, right before OHS"  . S/P CABG x 2 - LIMA-LAD, SVG-OM, 01/27/13 01/28/2013  . Smoker   . Thoracic aneurysm without mention of rupture      Past Surgical History:  Procedure Laterality Date  . ABDOMINAL HYSTERECTOMY    . BI-VENTRICULAR IMPLANTABLE CARDIOVERTER DEFIBRILLATOR N/A 06/01/2013   Procedure: BI-VENTRICULAR IMPLANTABLE CARDIOVERTER DEFIBRILLATOR  (CRT-D);  Surgeon: Evans Lance, MD;  Location: [facility] CATH LAB;  Service: Cardiovascular;  Laterality: N/A;  . CORONARY ARTERY BYPASS GRAFT N/A 01/27/2013   Procedure: CORONARY ARTERY BYPASS GRAFTING times two using Right Greater Saphenous Vein Graft Harvsted Endoscopically and Left Internal Mammary Artery;  Surgeon: Gaye Pollack, MD;  Location: [place] OR;  Service: Open Heart Surgery;  Laterality: N/A;  . DILATION AND CURETTAGE OF UTERUS    . FRACTURE SURGERY    . INTRAOPERATIVE TRANSESOPHAGEAL ECHOCARDIOGRAM N/A 01/27/2013   Procedure: INTRAOPERATIVE TRANSESOPHAGEAL ECHOCARDIOGRAM;  Surgeon: Gaye Pollack, MD;  Location: [facility] OR;  Service: Open Heart Surgery;   Laterality: N/A;  . LEFT HEART CATHETERIZATION WITH CORONARY ANGIOGRAM N/A 01/20/2013   Procedure: LEFT HEART CATHETERIZATION WITH CORONARY ANGIOGRAM;  Surgeon: Leonie Man, MD;  Location: [facility] CATH LAB;  Service: Cardiovascular;  Laterality: N/A;  . RIGHT HEART CATHETERIZATION  01/20/2013   Procedure: RIGHT HEART CATH;  Surgeon: Leonie Man, MD;  Location: [facility] CATH LAB;  Service: Cardiovascular;;  . TUBAL LIGATION     Family History  Problem Relation Age of Onset  . Hypertension Mother   . Heart disease Father   . Throat cancer Brother   . Brain cancer Brother   . Colon cancer Neg Hx   . Stomach cancer Neg Hx   . Rectal cancer Neg Hx   . Liver cancer Neg Hx    Social History  Substance Use Topics  . Smoking status: Current Some Day Smoker    Packs/day: 0.12    Years: 50.00    Types: Cigarettes  . Smokeless tobacco: Never Used  . Alcohol use Yes     Comment: 06/01/2013 "couple times/yr I'll have 1-2 drinks"   Current Outpatient Prescriptions  Medication Sig Dispense Refill  . aspirin EC 81 MG tablet Take 81 mg by mouth daily.    . hydrochlorothiazide (MICROZIDE) 12.5 MG capsule take 1 capsule by mouth once daily 90 capsule 3  . lisinopril (PRINIVIL,ZESTRIL) 5 MG tablet take 1 tablet by mouth once daily 90 tablet 3  . metoprolol tartrate (LOPRESSOR) 25 MG tablet Take 1 tablet (25 mg total) by mouth 2 (  two) times daily. 180 tablet 3  . vitamin E 200 UNIT capsule Take 200 Units by mouth daily.     No current facility-administered medications for this visit.    No Known Allergies   Review of Systems: All systems reviewed and negative except where noted in HPI.   Lab Results  Component Value Date   WBC 8.3 09/09/2013   HGB 14.6 09/09/2013   HCT 44.1 09/09/2013   MCV 79.2 09/09/2013   PLT 156 09/09/2013    Lab Results  Component Value Date   CREATININE 1.22 (H) 06/06/2016   BUN 32 (H) 06/06/2016   NA 142 06/06/2016   K 4.1 06/06/2016   CL 99 06/06/2016   CO2 22  06/06/2016    Physical Exam: BP 116/70   Pulse 84   Ht 5\' 4"  (1.626 m)   Wt 143 lb 8 oz (65.1 kg)   BMI 24.63 kg/m  Constitutional: Pleasant,well-developed, female in no acute distress. HEENT: Normocephalic and atraumatic. Conjunctivae are normal. No scleral icterus. Neck supple.  Cardiovascular: Normal rate, regular rhythm.  Pulmonary/chest: Effort normal and breath sounds normal.  Abdominal: Soft, nondistended, nontender.  There are no masses palpable. No hepatomegaly. Extremities: no edema Lymphadenopathy: No cervical adenopathy noted. Neurological: Alert and oriented to person place and time. Skin: Skin is warm and dry. No rashes noted. Psychiatric: Normal mood and affect. Behavior is normal.   ASSESSMENT AND PLAN: 71 year old female with a significant cardiac history as outlined above with last EF of 20-25%, presenting for reportedly positive fecal occult blood testing. She's had no prior colonoscopy.  I discussed the need for colonoscopy to further evaluate this issue. I discussed the risks and benefits of colonoscopy and anesthesia with her and she wanted to proceed. Given her history of heart failure we will check with her cardiologist, Dr. Lovena Le, to clarify her last EF to ensure we are not missing any interval echos, and see if she warrants a repeat echo if not done recently, as with her last ejection fraction her case would need to be done in the hospital for anesthesia support. Once we hear back from Cardiology we will schedule her accordingly. She agreed.   Oaks Cellar, MD [facility] Pager [phone]  CC: Ricke Hey, MD

## 2016-08-06 ENCOUNTER — Other Ambulatory Visit: Payer: Self-pay

## 2016-08-06 ENCOUNTER — Telehealth: Payer: Self-pay

## 2016-08-06 DIAGNOSIS — R195 Other fecal abnormalities: Secondary | ICD-10-CM

## 2016-08-06 MED ORDER — NA SULFATE-K SULFATE-MG SULF 17.5-3.13-1.6 GM/177ML PO SOLN: 1.0000 | ORAL | 0 refills | Status: DC

## 2016-08-06 NOTE — Telephone Encounter (Signed)
-----   Message from Manus Gunning, MD sent at 07/25/2016  7:43 AM EST ----- Regarding: FW: mutual patient Hi Almyra Free, Yet another patient who needs a colonoscopy at the hospital. I think we should keep a running list of these patients.  I need to sit down with you to look at the calendar to figure out how to accommodate these folks. Thanks much   ----- Message ----- From: Evans Lance, MD Sent: 07/24/2016   9:45 PM To: Manus Gunning, MD Subject: RE: mutual patient                             I would expect her EF to be slightly better on the order of 30%. I'd do in hospital in case you have problems. GT ----- Message ----- From: Manus Gunning, MD Sent: 07/24/2016   5:16 PM To: Evans Lance, MD Subject: mutual patient                                 Hi Dr. Lovena Le, I just saw Mrs. Boullion in our clinic, she is in need of a colonoscopy. The last EF I see on file in our system is from 2014, when it was around 20%. Is that the last EF you have on file for her? If so, do you think she warrants a repeat echo prior to anesthesia for her case? Her EF will determine if she needs her case done at the hospital with anesthesia support or not. Thanks for any information.  Richardson Landry

## 2016-08-06 NOTE — Telephone Encounter (Signed)
Left message for patient that colonoscopy is scheduled for 5/15 at [facility], prep sent to her pharmacy and instructions mailed.

## 2016-08-06 NOTE — Telephone Encounter (Signed)
Patient scheduled for [facility] on 5/15.

## 2016-09-08 ENCOUNTER — Ambulatory Visit (INDEPENDENT_AMBULATORY_CARE_PROVIDER_SITE_OTHER): Payer: Medicare Other | Admitting: *Deleted

## 2016-09-08 DIAGNOSIS — I5022 Chronic systolic (congestive) heart failure: Secondary | ICD-10-CM | POA: Diagnosis not present

## 2016-09-08 DIAGNOSIS — I255 Ischemic cardiomyopathy: Secondary | ICD-10-CM

## 2016-09-08 NOTE — Progress Notes (Signed)
Remote ICD transmission.   

## 2016-09-09 LAB — CUP PACEART REMOTE DEVICE CHECK
Brady Statistic AP VP Percent: 0 %
Brady Statistic AP VS Percent: 0 %
Brady Statistic AS VP Percent: 0.03 %
Brady Statistic AS VS Percent: 99.97 %
Brady Statistic RV Percent Paced: 0.02 %
Date Time Interrogation Session: 20180416082307
HighPow Impedance: 60 Ohm
Implantable Lead Implant Date: 20150107
Implantable Lead Location: 753859
Implantable Lead Location: 753860
Implantable Lead Model: 5076
Implantable Lead Model: 6935
Lead Channel Impedance Value: 380 Ohm
Lead Channel Impedance Value: 437 Ohm
Lead Channel Impedance Value: 437 Ohm
Lead Channel Impedance Value: 437 Ohm
Lead Channel Impedance Value: 456 Ohm
Lead Channel Impedance Value: 494 Ohm
Lead Channel Impedance Value: 665 Ohm
Lead Channel Impedance Value: 817 Ohm
Lead Channel Pacing Threshold Amplitude: 0.75 V
Lead Channel Pacing Threshold Amplitude: 0.75 V
Lead Channel Pacing Threshold Amplitude: 5.5 V
Lead Channel Pacing Threshold Pulse Width: 0.4 ms
Lead Channel Sensing Intrinsic Amplitude: 3 mV
Lead Channel Sensing Intrinsic Amplitude: 3 mV
Lead Channel Setting Pacing Amplitude: 2 V
MDC IDC LEAD IMPLANT DT: 20150107
MDC IDC LEAD IMPLANT DT: 20150107
MDC IDC LEAD LOCATION: 753857
MDC IDC MSMT BATTERY REMAINING LONGEVITY: 42 mo
MDC IDC MSMT BATTERY VOLTAGE: 2.91 V
MDC IDC MSMT LEADCHNL LV IMPEDANCE VALUE: 494 Ohm
MDC IDC MSMT LEADCHNL LV IMPEDANCE VALUE: 703 Ohm
MDC IDC MSMT LEADCHNL LV IMPEDANCE VALUE: 779 Ohm
MDC IDC MSMT LEADCHNL LV IMPEDANCE VALUE: 817 Ohm
MDC IDC MSMT LEADCHNL LV PACING THRESHOLD PULSEWIDTH: 0.8 ms
MDC IDC MSMT LEADCHNL RA PACING THRESHOLD PULSEWIDTH: 0.4 ms
MDC IDC MSMT LEADCHNL RV IMPEDANCE VALUE: 342 Ohm
MDC IDC MSMT LEADCHNL RV SENSING INTR AMPL: 23.125 mV
MDC IDC MSMT LEADCHNL RV SENSING INTR AMPL: 23.125 mV
MDC IDC PG IMPLANT DT: 20150107
MDC IDC SET LEADCHNL RV PACING PULSEWIDTH: 0.4 ms
MDC IDC SET LEADCHNL RV SENSING SENSITIVITY: 0.3 mV
MDC IDC STAT BRADY RA PERCENT PACED: 0 %

## 2016-09-10 ENCOUNTER — Encounter: Payer: Self-pay | Admitting: Cardiology

## 2016-10-07 ENCOUNTER — Ambulatory Visit (HOSPITAL_COMMUNITY)
Admission: RE | Admit: 2016-10-07 | Discharge: 2016-10-07 | Disposition: A | Discharge status: Home / Self Care | Payer: Medicare Other | Source: Ambulatory Visit | Attending: Gastroenterology | Admitting: Gastroenterology

## 2016-10-07 ENCOUNTER — Ambulatory Visit (HOSPITAL_COMMUNITY): Payer: Medicare Other | Admitting: Certified Registered Nurse Anesthetist

## 2016-10-07 ENCOUNTER — Encounter (HOSPITAL_COMMUNITY)
Admission: RE | Disposition: A | Discharge status: Home / Self Care | Payer: Self-pay | Source: Ambulatory Visit | Attending: Gastroenterology

## 2016-10-07 ENCOUNTER — Encounter (HOSPITAL_COMMUNITY): Payer: Self-pay | Admitting: *Deleted

## 2016-10-07 DIAGNOSIS — D123 Benign neoplasm of transverse colon: Secondary | ICD-10-CM | POA: Insufficient documentation

## 2016-10-07 DIAGNOSIS — R195 Other fecal abnormalities: Secondary | ICD-10-CM

## 2016-10-07 DIAGNOSIS — Z79899 Other long term (current) drug therapy: Secondary | ICD-10-CM | POA: Insufficient documentation

## 2016-10-07 DIAGNOSIS — I509 Heart failure, unspecified: Secondary | ICD-10-CM | POA: Insufficient documentation

## 2016-10-07 DIAGNOSIS — D125 Benign neoplasm of sigmoid colon: Secondary | ICD-10-CM | POA: Diagnosis not present

## 2016-10-07 DIAGNOSIS — I110 Hypertensive heart disease with heart failure: Secondary | ICD-10-CM | POA: Insufficient documentation

## 2016-10-07 DIAGNOSIS — F17210 Nicotine dependence, cigarettes, uncomplicated: Secondary | ICD-10-CM | POA: Insufficient documentation

## 2016-10-07 DIAGNOSIS — K6289 Other specified diseases of anus and rectum: Secondary | ICD-10-CM | POA: Diagnosis not present

## 2016-10-07 DIAGNOSIS — Z7982 Long term (current) use of aspirin: Secondary | ICD-10-CM | POA: Insufficient documentation

## 2016-10-07 DIAGNOSIS — Z951 Presence of aortocoronary bypass graft: Secondary | ICD-10-CM | POA: Insufficient documentation

## 2016-10-07 DIAGNOSIS — I255 Ischemic cardiomyopathy: Secondary | ICD-10-CM | POA: Diagnosis not present

## 2016-10-07 DIAGNOSIS — I2510 Atherosclerotic heart disease of native coronary artery without angina pectoris: Secondary | ICD-10-CM | POA: Diagnosis not present

## 2016-10-07 DIAGNOSIS — K5730 Diverticulosis of large intestine without perforation or abscess without bleeding: Secondary | ICD-10-CM | POA: Diagnosis not present

## 2016-10-07 DIAGNOSIS — I252 Old myocardial infarction: Secondary | ICD-10-CM | POA: Diagnosis not present

## 2016-10-07 DIAGNOSIS — K648 Other hemorrhoids: Secondary | ICD-10-CM | POA: Diagnosis not present

## 2016-10-07 HISTORY — PX: COLONOSCOPY WITH PROPOFOL: SHX5780

## 2016-10-07 SURGERY — COLONOSCOPY WITH PROPOFOL
Anesthesia: Monitor Anesthesia Care

## 2016-10-07 MED ORDER — LIDOCAINE 2% (20 MG/ML) 5 ML SYRINGE
INTRAMUSCULAR | Status: DC | PRN
  Administered 2016-10-07: 70 mg via INTRAVENOUS

## 2016-10-07 MED ORDER — SODIUM CHLORIDE 0.9 % IV SOLN: INTRAVENOUS | Status: DC

## 2016-10-07 MED ORDER — PROPOFOL 500 MG/50ML IV EMUL
INTRAVENOUS | Status: DC | PRN
  Administered 2016-10-07: 75 ug/kg/min via INTRAVENOUS

## 2016-10-07 MED ORDER — PROPOFOL 10 MG/ML IV BOLUS
INTRAVENOUS | Status: AC
  Filled 2016-10-07: qty 60

## 2016-10-07 MED ORDER — PROPOFOL 10 MG/ML IV BOLUS
INTRAVENOUS | Status: DC | PRN
  Administered 2016-10-07 (×4): 10 mg via INTRAVENOUS

## 2016-10-07 MED ORDER — LIDOCAINE 2% (20 MG/ML) 5 ML SYRINGE
INTRAMUSCULAR | Status: AC
  Filled 2016-10-07: qty 5

## 2016-10-07 MED ORDER — ONDANSETRON HCL 4 MG/2ML IJ SOLN
INTRAMUSCULAR | Status: AC
  Filled 2016-10-07: qty 2

## 2016-10-07 MED ORDER — EPHEDRINE SULFATE-NACL 50-0.9 MG/10ML-% IV SOSY
PREFILLED_SYRINGE | INTRAVENOUS | Status: DC | PRN
  Administered 2016-10-07: 5 mg via INTRAVENOUS

## 2016-10-07 MED ORDER — LACTATED RINGERS IV SOLN
INTRAVENOUS | Status: DC
  Administered 2016-10-07: 1000 mL via INTRAVENOUS

## 2016-10-07 MED ORDER — ONDANSETRON HCL 4 MG/2ML IJ SOLN
INTRAMUSCULAR | Status: DC | PRN
  Administered 2016-10-07: 4 mg via INTRAVENOUS

## 2016-10-07 SURGICAL SUPPLY — 22 items

## 2016-10-07 NOTE — H&P (Signed)
HPI :  71 y/o female with a history of CAD s/p CABG in 2014, ischemic cardiomyopathy with ICD in place, last EF 20-25% in 2014, LBBB, here for colonoscopy for occult blood in the stools .   She had a positive test for occult blood in her stools. She does not see any blood in the stools. She denies any problems with her bowels. No FH of colon cancer. No prior colonoscopy.   Echocardiogram 04/2013 - EF - 20-25%  Past Medical History:  Diagnosis Date  . Coronary artery disease   . Exertional shortness of breath   . Hypertension   . Ischemic cardiomyopathy   . LBBB (left bundle branch block)   . Myocardial infarction ([place]) 1990's;01/2013   ; "mild, right before OHS"  . S/P CABG x 2 - LIMA-LAD, SVG-OM, 01/27/13 01/28/2013  . Smoker   . Thoracic aneurysm without mention of rupture      Past Surgical History:  Procedure Laterality Date  . ABDOMINAL HYSTERECTOMY    . BI-VENTRICULAR IMPLANTABLE CARDIOVERTER DEFIBRILLATOR N/A 06/01/2013   Procedure: BI-VENTRICULAR IMPLANTABLE CARDIOVERTER DEFIBRILLATOR  (CRT-D);  Surgeon: Evans Lance, MD;  Location: [facility] CATH LAB;  Service: Cardiovascular;  Laterality: N/A;  . CORONARY ARTERY BYPASS GRAFT N/A 01/27/2013   Procedure: CORONARY ARTERY BYPASS GRAFTING times two using Right Greater Saphenous Vein Graft [place] Endoscopically and Left Internal Mammary Artery;  Surgeon: Gaye Pollack, MD;  Location: [place] OR;  Service: Open Heart Surgery;  Laterality: N/A;  . DILATION AND CURETTAGE OF UTERUS    . FRACTURE SURGERY    . INTRAOPERATIVE TRANSESOPHAGEAL ECHOCARDIOGRAM N/A 01/27/2013   Procedure: INTRAOPERATIVE TRANSESOPHAGEAL ECHOCARDIOGRAM;  Surgeon: Gaye Pollack, MD;  Location: [facility] OR;  Service: Open Heart Surgery;  Laterality: N/A;  . LEFT HEART CATHETERIZATION WITH CORONARY ANGIOGRAM N/A 01/20/2013   Procedure: LEFT HEART CATHETERIZATION WITH CORONARY ANGIOGRAM;  Surgeon: Leonie Man, MD;  Location: [facility] CATH LAB;  Service: Cardiovascular;  Laterality:  N/A;  . RIGHT HEART CATHETERIZATION  01/20/2013   Procedure: RIGHT HEART CATH;  Surgeon: Leonie Man, MD;  Location: [facility] CATH LAB;  Service: Cardiovascular;;  . TUBAL LIGATION     Family History  Problem Relation Age of Onset  . Hypertension Mother   . Heart disease Father   . Throat cancer Brother   . Brain cancer Brother   . Colon cancer Neg Hx   . Stomach cancer Neg Hx   . Rectal cancer Neg Hx   . Liver cancer Neg Hx    Social History  Substance Use Topics  . Smoking status: Current Some Day Smoker    Packs/day: 0.12    Years: 50.00    Types: Cigarettes  . Smokeless tobacco: Never Used  . Alcohol use Yes     Comment: 06/01/2013 "couple times/yr I'll have 1-2 drinks"   Current Facility-Administered Medications  Medication Dose Route Frequency Provider Last Rate Last Dose  . 0.9 %  sodium chloride infusion   Intravenous Continuous Armbruster, Renelda Loma, MD      . lactated ringers infusion   Intravenous Continuous Armbruster, Renelda Loma, MD 10 mL/hr at 10/07/16 0856 1,000 mL at 10/07/16 0856   No Known Allergies   Review of Systems: All systems reviewed and negative except where noted in HPI.   Lab Results  Component Value Date   WBC 8.3 09/09/2013   HGB 14.6 09/09/2013   HCT 44.1 09/09/2013   MCV 79.2 09/09/2013   PLT 156 09/09/2013  Lab Results  Component Value Date   CREATININE 1.22 (H) 06/06/2016   BUN 32 (H) 06/06/2016   NA 142 06/06/2016   K 4.1 06/06/2016   CL 99 06/06/2016   CO2 22 06/06/2016    Lab Results  Component Value Date   ALT 14 09/09/2013   AST 36 09/09/2013   ALKPHOS 150 (H) 09/09/2013   BILITOT 0.6 09/09/2013     Physical Exam: BP 118/72   Temp 98.2 F (36.8 C) (Oral)   Resp 20   Ht 5\' 4"  (1.626 m)   Wt 140 lb (63.5 kg)   SpO2 95%   BMI 24.03 kg/m  Constitutional: Pleasant,well-developed, female in no acute distress.  Cardiovascular: Normal rate, regular rhythm.  Pulmonary/chest: Effort normal and breath sounds  normal. No wheezing, rales or rhonchi. Abdominal: Soft, nondistended, nontender.  There are no masses palpable. No hepatomegaly. Extremities: no edema  ASSESSMENT AND PLAN: 71 y/o female with a history of CAD / cardiomyopathy, tobacco user, with (+) FOBT. Here for colonoscopy to further evaluate. Discussed risks / benefits of colonoscopy and anesthesia with her, she wished to proceed.   [place] Cellar, MD [facility] Pager [phone]

## 2016-10-07 NOTE — Discharge Instructions (Signed)

## 2016-10-07 NOTE — Interval H&P Note (Signed)
History and Physical Interval Note:  10/07/2016 9:00 AM  Kathryn Dixon  has presented today for surgery, with the diagnosis of Positive for occult blood in stool  The various methods of treatment have been discussed with the patient and family. After consideration of risks, benefits and other options for treatment, the patient has consented to  Procedure(s): COLONOSCOPY WITH PROPOFOL (N/A) as a surgical intervention .  The patient's history has been reviewed, patient examined, no change in status, stable for surgery.  I have reviewed the patient's chart and labs.  Questions were answered to the patient's satisfaction.     Renelda Loma Shantel Helwig

## 2016-10-07 NOTE — Op Note (Signed)
[facility] Patient Name: Kathryn Dixon Procedure Date: 10/07/2016 MRN: [id number] Attending MD: Carlota Raspberry. Armbruster MD, MD Date of Birth: 1945-07-05 CSN: [id number] Age: 71 Admit Type: Outpatient Procedure:                Colonoscopy Indications:              This is the patient's first colonoscopy, Heme                            positive stool Providers:                Remo Lipps P. Armbruster MD, MD, Vista Lawman, RN,                            William Dalton, Technician, Tinnie Gens, Technician Referring MD:              Medicines:                Monitored Anesthesia Care Complications:            No immediate complications. Estimated blood loss:                            Minimal. Estimated Blood Loss:     Estimated blood loss was minimal. Procedure:                Pre-Anesthesia Assessment:                           - Prior to the procedure, a History and Physical                            was performed, and patient medications and                            allergies were reviewed. The patient's tolerance of                            previous anesthesia was also reviewed. The risks                            and benefits of the procedure and the sedation                            options and risks were discussed with the patient.                            All questions were answered, and informed consent                            was obtained. Prior Anticoagulants: The patient has                            taken aspirin, last dose was 1 day prior to  procedure. ASA Grade Assessment: III - A patient                            with severe systemic disease. After reviewing the                            risks and benefits, the patient was deemed in                            satisfactory condition to undergo the procedure.                           After obtaining informed consent, the colonoscope                            was  passed under direct vision. Throughout the                            procedure, the patient's blood pressure, pulse, and                            oxygen saturations were monitored continuously. The                            was introduced through the anus and advanced to the                            the terminal ileum, with identification of the                            appendiceal orifice and IC valve. The colonoscopy                            was performed without difficulty. The patient                            tolerated the procedure well. The quality of the                            bowel preparation was good. The terminal ileum,                            ileocecal valve, appendiceal orifice, and rectum                            were photographed. Scope In: 9:13:18 AM Scope Out: 9:37:46 AM Scope Withdrawal Time: 0 hours 19 minutes 58 seconds  Total Procedure Duration: 0 hours 24 minutes 28 seconds  Findings:      The perianal and digital rectal examinations were normal.      Many small and large-mouthed diverticula were found in the recto-sigmoid       colon, sigmoid colon and descending colon, associated with hypertrophied       folds and some angulated turns.  Three sessile polyps were found in the transverse colon. The polyps were       4 to 5 mm in size. These polyps were removed with a cold snare.       Resection and retrieval were complete.      A 5 mm polyp was found in the sigmoid colon. The polyp was sessile. The       polyp was removed with a cold snare. Resection and retrieval were       complete.      Anal papilla(e) were hypertrophied.      Internal hemorrhoids were found during retroflexion.      Limited views of the distal terminal ileum appeared normal.      The exam was otherwise without abnormality. Of note, for some reason not       all pictures were stores during this exam (retroflexion and left colon       pictures not captured) Impression:                - Severe diverticulosis in the recto-sigmoid colon,                            in the sigmoid colon and in the descending colon.                           - Three 4 to 5 mm polyps in the transverse colon,                            removed with a cold snare. Resected and retrieved.                           - One 5 mm polyp in the sigmoid colon, removed with                            a cold snare. Resected and retrieved.                           - Anal papilla(e) were hypertrophied.                           - Internal hemorrhoids.                           - Limited views of the distal ileum was normal.                           - The examination was otherwise normal.                           - Pictures of left colon and retroflexion were not                            stored, unclear why this occured. Moderate Sedation:      No moderate sedation, case performed with MAC Recommendation:           - Patient has a contact number available for  emergencies. The signs and symptoms of potential                            delayed complications were discussed with the                            patient. Return to normal activities tomorrow.                            Written discharge instructions were provided to the                            patient.                           - Resume previous diet.                           - Continue present medications.                           - Await pathology results.                           - Repeat colonoscopy is recommended for                            surveillance. The colonoscopy date will be                            determined after pathology results from today's                            exam become available for review.                           - No ibuprofen, naproxen, or other non-steroidal                            anti-inflammatory drugs for 2 weeks after polyp                             removal. Procedure Code(s):        --- Professional ---                           [phone], Colonoscopy, flexible; with removal of                            tumor(s), polyp(s), or other lesion(s) by snare                            technique Diagnosis Code(s):        --- Professional ---                           Z60.1, Other hemorrhoids  D12.3, Benign neoplasm of transverse colon (hepatic                            flexure or splenic flexure)                           D12.5, Benign neoplasm of sigmoid colon                           K62.89, Other specified diseases of anus and rectum                           R19.5, Other fecal abnormalities                           K57.30, Diverticulosis of large intestine without                            perforation or abscess without bleeding CPT copyright 2016 [employer]. All rights reserved. The codes documented in this report are preliminary and upon coder review may  be revised to meet current compliance requirements. Remo Lipps P. Armbruster MD, MD 10/07/2016 9:44:20 AM This report has been signed electronically. Number of Addenda: 0

## 2016-10-07 NOTE — Anesthesia Preprocedure Evaluation (Addendum)
Anesthesia Evaluation  Patient identified by MRN, date of birth, ID band Patient awake    Reviewed: Allergy & Precautions, H&P , NPO status , Patient's Chart, lab work & pertinent test results  History of Anesthesia Complications Negative for: history of anesthetic complications  Airway Mallampati: II  TM Distance: >3 FB Neck ROM: Full    Dental  (+) Edentulous Upper, Edentulous Lower, Dental Advisory Given   Pulmonary shortness of breath and at rest, Current Smoker,    Pulmonary exam normal        Cardiovascular hypertension, + CAD, + Past MI, + CABG, + Peripheral Vascular Disease and +CHF (EF 20-25)  + dysrhythmias  Rhythm:Regular Rate:Normal  Ischemic cardiomyopathy,  Aneurysmal dilation of aorta   Neuro/Psych negative neurological ROS  negative psych ROS   GI/Hepatic negative GI ROS, Neg liver ROS,   Endo/Other  negative endocrine ROS  Renal/GU negative Renal ROS     Musculoskeletal   Abdominal   Peds  Hematology   Anesthesia Other Findings   Reproductive/Obstetrics                           Anesthesia Physical  Anesthesia Plan  ASA: III  Anesthesia Plan: MAC   Post-op Pain Management:    Induction:   Airway Management Planned: Simple Face Mask  Additional Equipment:   Intra-op Plan:   Post-operative Plan:   Informed Consent: I have reviewed the patients History and Physical, chart, labs and discussed the procedure including the risks, benefits and alternatives for the proposed anesthesia with the patient or authorized representative who has indicated his/her understanding and acceptance.     Plan Discussed with: CRNA and Anesthesiologist  Anesthesia Plan Comments:         Anesthesia Quick Evaluation

## 2016-10-07 NOTE — Anesthesia Postprocedure Evaluation (Addendum)
Anesthesia Post Note  Patient: Kathryn Dixon  Procedure(s) Performed: Procedure(s) (LRB): COLONOSCOPY WITH PROPOFOL (N/A)  Patient location during evaluation: Endoscopy Anesthesia Type: MAC Level of consciousness: awake and alert Pain management: pain level controlled Vital Signs Assessment: post-procedure vital signs reviewed and stable Respiratory status: spontaneous breathing and respiratory function stable Cardiovascular status: stable Anesthetic complications: no       Last Vitals:  Vitals:   10/07/16 0950 10/07/16 1000  BP: 123/73 122/84  Pulse: 79   Resp: (!) 21 (!) 24  Temp:      Last Pain:  Vitals:   10/07/16 0944  TempSrc: Oral                 Henning Ehle DANIEL

## 2016-10-07 NOTE — Transfer of Care (Signed)
Immediate Anesthesia Transfer of Care Note  Patient: Kathryn Dixon  Procedure(s) Performed: Procedure(s): COLONOSCOPY WITH PROPOFOL (N/A)  Patient Location: ENDO  Anesthesia Type:MAC  Level of Consciousness:  sedated, patient cooperative and responds to stimulation  Airway & Oxygen Therapy:Patient Spontanous Breathing and Patient connected to face mask oxgen  Post-op Assessment:  Report given to ENDO RN and Post -op Vital signs reviewed and stable  Post vital signs:  Reviewed and stable  Last Vitals:  Vitals:   10/07/16 0847  BP: 118/72  Resp: 20  Temp: 84.6 C    Complications: No apparent anesthesia complications

## 2016-10-08 ENCOUNTER — Encounter (HOSPITAL_COMMUNITY): Payer: Self-pay | Admitting: Gastroenterology

## 2016-10-09 ENCOUNTER — Encounter: Payer: Self-pay | Admitting: Gastroenterology

## 2016-10-25 NOTE — Addendum Note (Signed)
Addendum  created 10/25/16 1004 by Adryan Shin, MD   Sign clinical note    

## 2016-10-26 ENCOUNTER — Other Ambulatory Visit: Payer: Self-pay | Admitting: Internal Medicine

## 2016-10-26 DIAGNOSIS — I5022 Chronic systolic (congestive) heart failure: Secondary | ICD-10-CM

## 2016-12-08 ENCOUNTER — Telehealth: Payer: Self-pay | Admitting: Cardiology

## 2016-12-08 ENCOUNTER — Encounter: Payer: Medicare Other | Admitting: *Deleted

## 2016-12-08 NOTE — Telephone Encounter (Signed)
LMOVM reminding pt to send remote transmission.   

## 2016-12-11 ENCOUNTER — Encounter: Payer: Self-pay | Admitting: Cardiology

## 2016-12-23 ENCOUNTER — Telehealth: Payer: Self-pay | Admitting: Cardiology

## 2016-12-23 NOTE — Telephone Encounter (Signed)
New Message:    Pt wants to know if her transmission went through?

## 2016-12-23 NOTE — Telephone Encounter (Signed)
Transmission not received. Spoke with Ms. Kathryn Dixon, she isn't sure what happened with the monitor. I am ordering her a more user-friendly monitor from [employer] which will arrive 7-10 business days. Rescheduling transmission for 01/06/17. Ms. Kathryn Dixon is appreciative.

## 2017-01-06 ENCOUNTER — Ambulatory Visit (INDEPENDENT_AMBULATORY_CARE_PROVIDER_SITE_OTHER): Payer: Medicare Other | Admitting: *Deleted

## 2017-01-06 DIAGNOSIS — I5022 Chronic systolic (congestive) heart failure: Secondary | ICD-10-CM | POA: Diagnosis not present

## 2017-01-06 DIAGNOSIS — I255 Ischemic cardiomyopathy: Secondary | ICD-10-CM

## 2017-01-06 NOTE — Progress Notes (Signed)
Remote defibrillator check.  

## 2017-01-07 LAB — CUP PACEART REMOTE DEVICE CHECK
Battery Remaining Longevity: 35 mo
Battery Voltage: 2.96 V
Brady Statistic AP VS Percent: 0 %
Brady Statistic RA Percent Paced: 0 %
Brady Statistic RV Percent Paced: 0.01 %
HIGH POWER IMPEDANCE MEASURED VALUE: 69 Ohm
Implantable Lead Implant Date: 20150107
Implantable Lead Implant Date: 20150107
Implantable Lead Location: 753859
Implantable Lead Location: 753860
Implantable Lead Model: 4298
Implantable Lead Model: 5076
Implantable Lead Model: 6935
Implantable Pulse Generator Implant Date: 20150107
Lead Channel Impedance Value: 342 Ohm
Lead Channel Impedance Value: 380 Ohm
Lead Channel Impedance Value: 399 Ohm
Lead Channel Impedance Value: 399 Ohm
Lead Channel Impedance Value: 456 Ohm
Lead Channel Impedance Value: 513 Ohm
Lead Channel Impedance Value: 646 Ohm
Lead Channel Impedance Value: 665 Ohm
Lead Channel Impedance Value: 779 Ohm
Lead Channel Pacing Threshold Amplitude: 0.75 V
Lead Channel Pacing Threshold Pulse Width: 0.4 ms
Lead Channel Sensing Intrinsic Amplitude: 24.125 mV
Lead Channel Sensing Intrinsic Amplitude: 24.125 mV
Lead Channel Setting Pacing Amplitude: 2 V
Lead Channel Setting Pacing Pulse Width: 0.4 ms
MDC IDC LEAD IMPLANT DT: 20150107
MDC IDC LEAD LOCATION: 753857
MDC IDC MSMT LEADCHNL LV IMPEDANCE VALUE: 437 Ohm
MDC IDC MSMT LEADCHNL LV IMPEDANCE VALUE: 779 Ohm
MDC IDC MSMT LEADCHNL LV IMPEDANCE VALUE: 817 Ohm
MDC IDC MSMT LEADCHNL LV PACING THRESHOLD AMPLITUDE: 5.5 V
MDC IDC MSMT LEADCHNL LV PACING THRESHOLD PULSEWIDTH: 0.8 ms
MDC IDC MSMT LEADCHNL RA IMPEDANCE VALUE: 437 Ohm
MDC IDC MSMT LEADCHNL RA PACING THRESHOLD AMPLITUDE: 0.75 V
MDC IDC MSMT LEADCHNL RA SENSING INTR AMPL: 2.875 mV
MDC IDC MSMT LEADCHNL RA SENSING INTR AMPL: 2.875 mV
MDC IDC MSMT LEADCHNL RV PACING THRESHOLD PULSEWIDTH: 0.4 ms
MDC IDC SESS DTM: 20180814073324
MDC IDC SET LEADCHNL RV SENSING SENSITIVITY: 0.3 mV
MDC IDC STAT BRADY AP VP PERCENT: 0 %
MDC IDC STAT BRADY AS VP PERCENT: 0.01 %
MDC IDC STAT BRADY AS VS PERCENT: 99.99 %

## 2017-01-20 ENCOUNTER — Encounter: Payer: Self-pay | Admitting: Cardiology

## 2017-03-30 ENCOUNTER — Emergency Department (HOSPITAL_COMMUNITY)
Admission: EM | Admit: 2017-03-30 | Discharge: 2017-03-30 | Disposition: A | Discharge status: Home / Self Care | Payer: Medicare Other | Attending: Emergency Medicine | Admitting: Emergency Medicine

## 2017-03-30 ENCOUNTER — Other Ambulatory Visit: Payer: Self-pay

## 2017-03-30 ENCOUNTER — Emergency Department (HOSPITAL_COMMUNITY): Payer: Medicare Other

## 2017-03-30 ENCOUNTER — Encounter (HOSPITAL_COMMUNITY): Payer: Self-pay

## 2017-03-30 DIAGNOSIS — I110 Hypertensive heart disease with heart failure: Secondary | ICD-10-CM | POA: Diagnosis not present

## 2017-03-30 DIAGNOSIS — W228XXA Striking against or struck by other objects, initial encounter: Secondary | ICD-10-CM | POA: Diagnosis not present

## 2017-03-30 DIAGNOSIS — F17210 Nicotine dependence, cigarettes, uncomplicated: Secondary | ICD-10-CM | POA: Diagnosis not present

## 2017-03-30 DIAGNOSIS — Y929 Unspecified place or not applicable: Secondary | ICD-10-CM | POA: Insufficient documentation

## 2017-03-30 DIAGNOSIS — I5022 Chronic systolic (congestive) heart failure: Secondary | ICD-10-CM | POA: Insufficient documentation

## 2017-03-30 DIAGNOSIS — Y9389 Activity, other specified: Secondary | ICD-10-CM | POA: Diagnosis not present

## 2017-03-30 DIAGNOSIS — I2510 Atherosclerotic heart disease of native coronary artery without angina pectoris: Secondary | ICD-10-CM | POA: Diagnosis not present

## 2017-03-30 DIAGNOSIS — Z951 Presence of aortocoronary bypass graft: Secondary | ICD-10-CM | POA: Insufficient documentation

## 2017-03-30 DIAGNOSIS — Z7982 Long term (current) use of aspirin: Secondary | ICD-10-CM | POA: Diagnosis not present

## 2017-03-30 DIAGNOSIS — T17308A Unspecified foreign body in larynx causing other injury, initial encounter: Secondary | ICD-10-CM

## 2017-03-30 DIAGNOSIS — Z79899 Other long term (current) drug therapy: Secondary | ICD-10-CM | POA: Diagnosis not present

## 2017-03-30 DIAGNOSIS — Y999 Unspecified external cause status: Secondary | ICD-10-CM | POA: Diagnosis not present

## 2017-03-30 NOTE — Discharge Instructions (Signed)
Follow up with your PCP.  Be careful eating your food.  You may need to see GI if you have continued choking episodes.

## 2017-03-30 NOTE — ED Provider Notes (Signed)
[place] EMERGENCY DEPARTMENT Provider Note   CSN: [id number] Arrival date & time: 03/30/17  1217     History   Chief Complaint Chief Complaint  Patient presents with  . Choking    HPI Kathryn Dixon is a 71 y.o. female.  71 yo F with a chief complaint of a coughing fit.  The patient was drinking heavily this morning and then when she had her lunch she felt it get stuck.  She had a coughing fit and was able to get it up.  It took her a while though and during this time she dialed 911.  She now feels totally better.  Denies difficulty with eating or swallowing.  Denies fevers or chills.  Denies persistent shortness of breath.  She has had an issue swallowing over the past many years.   The history is provided by the patient and a relative.  Illness  This is a new problem. The current episode started 1 to 2 hours ago. The problem occurs constantly. The problem has been resolved. Pertinent negatives include no chest pain, no headaches and no shortness of breath. Nothing aggravates the symptoms. Nothing relieves the symptoms. She has tried nothing for the symptoms. The treatment provided no relief.    Past Medical History:  Diagnosis Date  . Coronary artery disease   . Exertional shortness of breath   . Hypertension   . Ischemic cardiomyopathy   . LBBB (left bundle branch block)   . Myocardial infarction ([place]) 1990's;01/2013   ; "mild, right before OHS"  . S/P CABG x 2 - LIMA-LAD, SVG-OM, 01/27/13 01/28/2013  . Smoker   . Thoracic aneurysm without mention of rupture     Patient Active Problem List   Diagnosis Date Noted  . Fecal occult blood test positive   . Biventricular ICD (implantable cardioverter-defibrillator) in place 09/13/2013  . Chronic systolic heart failure ([place]) 06/01/2013  . Smoker 03/17/2013  . S/P CABG x 2 - LIMA-LAD, SVG-OM, 01/27/13 01/28/2013    Class: Acute  . Postoperative anemia due to acute blood loss 01/28/2013  . Cardiomyopathy,  ischemic 20-25% with moderate MR 01/21/2013  . UTI (urinary tract infection)-  E.Coli, TREATED with Cipro 01/21/2013  . Dyslipidemia 01/21/2013  . NSTEMI (non-ST elevated myocardial infarction) ([place]) 01/19/2013  . Aneurysmal dilatation both ascending and descending thoracic aorta wth mural thrombus 01/19/2013  . Hypokalemia 01/19/2013  . LBBB (left bundle branch block) 01/18/2013  . Dyspnea, acute 01/18/2013  . Tobacco abuse 01/18/2013    Past Surgical History:  Procedure Laterality Date  . ABDOMINAL HYSTERECTOMY    . DILATION AND CURETTAGE OF UTERUS    . FRACTURE SURGERY    . TUBAL LIGATION      OB History    No data available       Home Medications    Prior to Admission medications   Medication Sig Start Date End Date Taking? Authorizing Provider  aspirin EC 81 MG tablet Take 81 mg by mouth daily.   Yes [provider]  hydrochlorothiazide (MICROZIDE) 12.5 MG capsule take 1 capsule by mouth once daily 06/09/16  Yes Evans Lance, MD  lisinopril (PRINIVIL,ZESTRIL) 5 MG tablet take 1 tablet by mouth once daily 06/09/16  Yes Evans Lance, MD  metoprolol tartrate (LOPRESSOR) 25 MG tablet take 1 tablet by mouth twice a day 10/27/16  Yes Evans Lance, MD  vitamin E 200 UNIT capsule Take 200 Units by mouth daily.   Yes [provider]  Family History Family History  Problem Relation Age of Onset  . Hypertension Mother   . Heart disease Father   . Throat cancer Brother   . Brain cancer Brother   . Colon cancer Neg Hx   . Stomach cancer Neg Hx   . Rectal cancer Neg Hx   . Liver cancer Neg Hx     Social History Social History   Tobacco Use  . Smoking status: Current Some Day Smoker    Packs/day: 0.12    Years: 50.00    Pack years: 6.00    Types: Cigarettes  . Smokeless tobacco: Never Used  Substance Use Topics  . Alcohol use: Yes    Comment: 06/01/2013 "couple times/yr I'll have 1-2 drinks"  . Drug use: No     Allergies   Patient has no  known allergies.   Review of Systems Review of Systems  Constitutional: Negative for chills and fever.  HENT: Negative for congestion and rhinorrhea.   Eyes: Negative for redness and visual disturbance.  Respiratory: Positive for choking. Negative for shortness of breath and wheezing.   Cardiovascular: Negative for chest pain and palpitations.  Gastrointestinal: Negative for nausea and vomiting.  Genitourinary: Negative for dysuria and urgency.  Musculoskeletal: Negative for arthralgias and myalgias.  Skin: Negative for pallor and wound.  Neurological: Negative for dizziness and headaches.     Physical Exam Updated Vital Signs BP 107/70 (BP Location: Right Arm)   Pulse 77   Temp (!) 97.5 F (36.4 C) (Oral)   Resp 20   Ht 5\' 4"  (1.626 m)   Wt 63.5 kg (140 lb)   SpO2 98%   BMI 24.03 kg/m   Physical Exam  Constitutional: She is oriented to person, place, and time. She appears well-developed and well-nourished. No distress.  Clinically intoxicated  HENT:  Head: Normocephalic and atraumatic.  Eyes: EOM are normal. Pupils are equal, round, and reactive to light.  Neck: Normal range of motion. Neck supple.  Cardiovascular: Normal rate and regular rhythm. Exam reveals no gallop and no friction rub.  No murmur heard. Pulmonary/Chest: Effort normal. She has no wheezes. She has no rales.  Abdominal: Soft. She exhibits no distension and no mass. There is no tenderness. There is no guarding.  Musculoskeletal: She exhibits no edema or tenderness.  Neurological: She is alert and oriented to person, place, and time.  Skin: Skin is warm and dry. She is not diaphoretic.  Psychiatric: She has a normal mood and affect. Her behavior is normal.  Nursing note and vitals reviewed.    ED Treatments / Results  Labs (all labs ordered are listed, but only abnormal results are displayed) Labs Reviewed - No data to display  EKG  EKG Interpretation None       Radiology Dg Chest 2  View  Result Date: 03/30/2017 CLINICAL DATA:  She choked on a piece of chicken today with near syncopal episode relieved by dislodging the chicken from the throat. The patient began coughing there after. Clinically intoxicated. EXAM: CHEST  2 VIEW COMPARISON:  Chest x-ray of Oct 10, 2014, and chest CT scan of January 19, 2013. FINDINGS: The lungs are well-expanded and clear. The cardiac silhouette is enlarged. The pulmonary vascularity is engorged. The ICD is in stable position. The sternal wires are intact. There is calcification in the wall of the tortuous thoracic aorta. There is a large hiatal hernia. There is multilevel degenerative disc disease of the thoracic spine. IMPRESSION: There is no evidence of acute  pneumonia. Stable cardiomegaly without pulmonary vascular congestion. Thoracic aortic atherosclerosis.  Known thoracic aortic aneurysm Air within the esophagus or large hiatal hernia. Electronically Signed   By: David  Martinique M.D.   On: 03/30/2017 13:33    Procedures Procedures (including critical care time)  Medications Ordered in ED Medications - No data to display   Initial Impression / Assessment and Plan / ED Course  I have reviewed the triage vital signs and the nursing notes.  Pertinent labs & imaging results that were available during my care of the patient were reviewed by me and considered in my medical decision making (see chart for details).     71 yo F with a chief complaint of a aspiration event.  She was able to get it up and out.  She currently has no symptoms.  Will obtain a chest x-ray and do an oral trial and reassess.   Patient tolerating oral, cxr without pna or other concerning findings. D/c home.   4:16 PM:  I have discussed the diagnosis/risks/treatment options with the patient and family and believe the pt to be eligible for discharge home to follow-up with PCP. We also discussed returning to the ED immediately if new or worsening sx occur. We discussed the sx  which are most concerning (e.g., sudden worsening pain, fever, inability to tolerate by mouth) that necessitate immediate return. Medications administered to the patient during their visit and any new prescriptions provided to the patient are listed below.  Medications given during this visit Medications - No data to display   The patient appears reasonably screen and/or stabilized for discharge and I doubt any other medical condition or other [facility] requiring further screening, evaluation, or treatment in the ED at this time prior to discharge.    Final Clinical Impressions(s) / ED Diagnoses   Final diagnoses:  Choking, initial encounter    ED Discharge Orders    None       Deno Etienne, DO 03/30/17 1616

## 2017-03-30 NOTE — ED Notes (Signed)
Pt states she understands instructions. Home stable via wc with granddaughter.

## 2017-03-30 NOTE — ED Notes (Signed)
Patient transported to X-ray 

## 2017-03-30 NOTE — ED Triage Notes (Signed)
Patient was at home drinking etoh with her niece. She went to heat up a microwave meal and when she started to eat she got choked on a piece of chicken. Ems reports that she was on the floor foaming at the mouth and was almost unresponsive. Chicken was dislodged from throat and pt began coughing. She arrives to the er alert and oriented but intoxicated. No complaints.

## 2017-04-07 ENCOUNTER — Ambulatory Visit (INDEPENDENT_AMBULATORY_CARE_PROVIDER_SITE_OTHER): Payer: Medicare Other | Admitting: *Deleted

## 2017-04-07 DIAGNOSIS — I255 Ischemic cardiomyopathy: Secondary | ICD-10-CM | POA: Diagnosis not present

## 2017-04-07 NOTE — Progress Notes (Signed)
Remote ICD transmission.   

## 2017-04-08 LAB — CUP PACEART REMOTE DEVICE CHECK
Brady Statistic AP VP Percent: 0 %
Brady Statistic AP VS Percent: 0 %
Brady Statistic AS VP Percent: 0.03 %
Brady Statistic RV Percent Paced: 0.03 %
Date Time Interrogation Session: 20181113093625
HighPow Impedance: 70 Ohm
Implantable Lead Implant Date: 20150107
Implantable Lead Location: 753859
Implantable Lead Location: 753860
Implantable Lead Model: 5076
Implantable Lead Model: 6935
Implantable Pulse Generator Implant Date: 20150107
Lead Channel Impedance Value: 399 Ohm
Lead Channel Impedance Value: 399 Ohm
Lead Channel Impedance Value: 494 Ohm
Lead Channel Impedance Value: 513 Ohm
Lead Channel Impedance Value: 779 Ohm
Lead Channel Impedance Value: 817 Ohm
Lead Channel Impedance Value: 817 Ohm
Lead Channel Pacing Threshold Amplitude: 0.75 V
Lead Channel Pacing Threshold Amplitude: 5.5 V
Lead Channel Pacing Threshold Pulse Width: 0.4 ms
Lead Channel Pacing Threshold Pulse Width: 0.4 ms
Lead Channel Sensing Intrinsic Amplitude: 2.875 mV
Lead Channel Sensing Intrinsic Amplitude: 2.875 mV
Lead Channel Sensing Intrinsic Amplitude: 30.5 mV
Lead Channel Setting Pacing Amplitude: 2 V
MDC IDC LEAD IMPLANT DT: 20150107
MDC IDC LEAD IMPLANT DT: 20150107
MDC IDC LEAD LOCATION: 753857
MDC IDC MSMT BATTERY REMAINING LONGEVITY: 33 mo
MDC IDC MSMT BATTERY VOLTAGE: 2.95 V
MDC IDC MSMT LEADCHNL LV IMPEDANCE VALUE: 437 Ohm
MDC IDC MSMT LEADCHNL LV IMPEDANCE VALUE: 456 Ohm
MDC IDC MSMT LEADCHNL LV IMPEDANCE VALUE: 703 Ohm
MDC IDC MSMT LEADCHNL LV IMPEDANCE VALUE: 722 Ohm
MDC IDC MSMT LEADCHNL LV PACING THRESHOLD PULSEWIDTH: 0.8 ms
MDC IDC MSMT LEADCHNL RV IMPEDANCE VALUE: 342 Ohm
MDC IDC MSMT LEADCHNL RV IMPEDANCE VALUE: 456 Ohm
MDC IDC MSMT LEADCHNL RV PACING THRESHOLD AMPLITUDE: 0.75 V
MDC IDC MSMT LEADCHNL RV SENSING INTR AMPL: 30.5 mV
MDC IDC SET LEADCHNL RV PACING PULSEWIDTH: 0.4 ms
MDC IDC SET LEADCHNL RV SENSING SENSITIVITY: 0.3 mV
MDC IDC STAT BRADY AS VS PERCENT: 99.97 %
MDC IDC STAT BRADY RA PERCENT PACED: 0 %

## 2017-04-10 ENCOUNTER — Encounter: Payer: Self-pay | Admitting: Cardiology

## 2017-06-16 ENCOUNTER — Encounter: Payer: Self-pay | Admitting: Internal Medicine

## 2017-06-16 ENCOUNTER — Ambulatory Visit (INDEPENDENT_AMBULATORY_CARE_PROVIDER_SITE_OTHER): Payer: Medicare Other | Admitting: Internal Medicine

## 2017-06-16 ENCOUNTER — Encounter (INDEPENDENT_AMBULATORY_CARE_PROVIDER_SITE_OTHER): Payer: Self-pay

## 2017-06-16 VITALS — BP 146/80 | HR 81 | Resp 16 | Ht 64.0 in | Wt 133.6 lb

## 2017-06-16 DIAGNOSIS — I5022 Chronic systolic (congestive) heart failure: Secondary | ICD-10-CM | POA: Diagnosis not present

## 2017-06-16 DIAGNOSIS — I255 Ischemic cardiomyopathy: Secondary | ICD-10-CM | POA: Diagnosis not present

## 2017-06-16 DIAGNOSIS — Z95810 Presence of automatic (implantable) cardiac defibrillator: Secondary | ICD-10-CM | POA: Diagnosis not present

## 2017-06-16 DIAGNOSIS — I447 Left bundle-branch block, unspecified: Secondary | ICD-10-CM

## 2017-06-16 NOTE — Progress Notes (Signed)
HPI Mrs. Campion returns today for followup. She is a pleasant 72 yo woman with an ICM, chronic systolic heart failure, LBBB, s/p BiV ICD. In the interim, she has done well. Since she broke her foot, he has been sedentary. No chest pain. No ICD shock. She was noted to have an increase in her LV pacing threshold and this resulted in diaphragmatic stimulation. She has become even more sedentary due to instability when she walks. No chest pain or sob.   No Known Allergies   Current Outpatient Medications  Medication Sig Dispense Refill  . aspirin EC 81 MG tablet Take 81 mg by mouth daily.    . hydrochlorothiazide (MICROZIDE) 12.5 MG capsule take 1 capsule by mouth once daily 90 capsule 3  . lisinopril (PRINIVIL,ZESTRIL) 5 MG tablet take 1 tablet by mouth once daily 90 tablet 3  . metoprolol tartrate (LOPRESSOR) 25 MG tablet take 1 tablet by mouth twice a day 180 tablet 1  . vitamin E 200 UNIT capsule Take 200 Units by mouth daily.     No current facility-administered medications for this visit.      Past Medical History:  Diagnosis Date  . Coronary artery disease   . Exertional shortness of breath   . Hypertension   . Ischemic cardiomyopathy   . LBBB (left bundle branch block)   . Myocardial infarction ([place]) 1990's;01/2013   ; "mild, right before OHS"  . S/P CABG x 2 - LIMA-LAD, SVG-OM, 01/27/13 01/28/2013  . Smoker   . Thoracic aneurysm without mention of rupture     ROS:   All systems reviewed and negative except as noted in the HPI.   Past Surgical History:  Procedure Laterality Date  . ABDOMINAL HYSTERECTOMY    . BI-VENTRICULAR IMPLANTABLE CARDIOVERTER DEFIBRILLATOR N/A 06/01/2013   Procedure: BI-VENTRICULAR IMPLANTABLE CARDIOVERTER DEFIBRILLATOR  (CRT-D);  Surgeon: Evans Lance, MD;  Location: [facility] CATH LAB;  Service: Cardiovascular;  Laterality: N/A;  . COLONOSCOPY WITH PROPOFOL N/A 10/07/2016   Procedure: COLONOSCOPY WITH PROPOFOL;  Surgeon: Manus Gunning,  MD;  Location: [facility] ENDOSCOPY;  Service: Gastroenterology;  Laterality: N/A;  . CORONARY ARTERY BYPASS GRAFT N/A 01/27/2013   Procedure: CORONARY ARTERY BYPASS GRAFTING times two using Right Greater Saphenous Vein Graft [place] Endoscopically and Left Internal Mammary Artery;  Surgeon: Gaye Pollack, MD;  Location: [facility] OR;  Service: Open Heart Surgery;  Laterality: N/A;  . DILATION AND CURETTAGE OF UTERUS    . FRACTURE SURGERY    . INTRAOPERATIVE TRANSESOPHAGEAL ECHOCARDIOGRAM N/A 01/27/2013   Procedure: INTRAOPERATIVE TRANSESOPHAGEAL ECHOCARDIOGRAM;  Surgeon: Gaye Pollack, MD;  Location: [facility] OR;  Service: Open Heart Surgery;  Laterality: N/A;  . LEFT HEART CATHETERIZATION WITH CORONARY ANGIOGRAM N/A 01/20/2013   Procedure: LEFT HEART CATHETERIZATION WITH CORONARY ANGIOGRAM;  Surgeon: Leonie Man, MD;  Location: [facility] CATH LAB;  Service: Cardiovascular;  Laterality: N/A;  . RIGHT HEART CATHETERIZATION  01/20/2013   Procedure: RIGHT HEART CATH;  Surgeon: Leonie Man, MD;  Location: [facility] CATH LAB;  Service: Cardiovascular;;  . TUBAL LIGATION       Family History  Problem Relation Age of Onset  . Hypertension Mother   . Heart disease Father   . Throat cancer Brother   . Brain cancer Brother   . Colon cancer Neg Hx   . Stomach cancer Neg Hx   . Rectal cancer Neg Hx   . Liver cancer Neg Hx      Social History  Socioeconomic History  . Marital status: Divorced    Spouse name: Not on file  . Number of children: 3  . Years of education: Not on file  . Highest education level: Not on file  Social Needs  . Financial resource strain: Not on file  . Food insecurity - worry: Not on file  . Food insecurity - inability: Not on file  . Transportation needs - medical: Not on file  . Transportation needs - non-medical: Not on file  Occupational History  . Occupation: retired  Tobacco Use  . Smoking status: Current Some Day Smoker    Packs/day: 0.12    Years: 50.00    Pack years: 6.00     Types: Cigarettes  . Smokeless tobacco: Never Used  Substance and Sexual Activity  . Alcohol use: Yes    Comment: 06/01/2013 "couple times/yr I'll have 1-2 drinks"  . Drug use: No  . Sexual activity: Not Currently    Birth control/protection: Surgical  Other Topics Concern  . Not on file  Social History Narrative  . Not on file     BP (!) 146/80   Pulse 81   Resp 16   Ht 5\' 4"  (1.626 m)   Wt 133 lb 9.6 oz (60.6 kg)   SpO2 96%   BMI 22.93 kg/m   Physical Exam:  stable appearing 72 yo woman,NAD HEENT: Unremarkable Neck:  6 cm JVD, no thyromegally Lymphatics:  No adenopathy Back:  No CVA tenderness Lungs:  Clear with no wheezes HEART:  Regular rate rhythm, no murmurs, no rubs, no clicks Abd:  soft, positive bowel sounds, no organomegally, no rebound, no guarding Ext:  2 plus pulses, no edema, no cyanosis, no clubbing Skin:  No rashes no nodules Neuro:  CN II through XII intact, motor grossly intact  EKG - NSR with LBBB and frequent PVC's  DEVICE  Normal device function.  See PaceArt for details.   Assess/Plan: 1. Chronic systolic heart failure - her symptoms are class 2. She is sedentary. She has been encouraged to increase her activity. 2. ICD - her Medtronic device is working normally.  3. Diaphragmatic stim - she has not had any symptoms since LV pacing was turned off.   Mikle Bosworth.D.

## 2017-06-16 NOTE — Patient Instructions (Addendum)
Medication Instructions:  Your physician recommends that you continue on your current medications as directed. Please refer to the Current Medication list given to you today.  Labwork: None ordered.  Testing/Procedures: None ordered.    Follow-Up:  Your physician wants you to follow-up In one year with Dr Lovena Le. You will receive a reminder letter in the mail two months in advance. If you don't receive a letter, please call our office to schedule the follow-up appointment.  Remote monitoring is used to monitor your Pacemaker from home. This monitoring reduces the number of office visits required to check your device to one time per year. It allows [place] to keep an eye on the functioning of your device to ensure it is working properly. You are scheduled for a device check from home on 07/07/2017. You may send your transmission at any time that day. If you have a wireless device, the transmission will be sent automatically. After your physician reviews your transmission, you will receive a postcard with your next transmission date.    Any Other Special Instructions Will Be Listed Below (If Applicable).     If you need a refill on your cardiac medications before your next appointment, please call your pharmacy.

## 2017-06-29 ENCOUNTER — Other Ambulatory Visit: Payer: Self-pay | Admitting: Internal Medicine

## 2017-06-29 MED ORDER — HYDROCHLOROTHIAZIDE 12.5 MG PO CAPS: 12.5000 mg | ORAL_CAPSULE | Freq: Every day | ORAL | 3 refills | Status: DC

## 2017-07-07 ENCOUNTER — Ambulatory Visit (INDEPENDENT_AMBULATORY_CARE_PROVIDER_SITE_OTHER): Payer: Medicare Other | Admitting: *Deleted

## 2017-07-07 DIAGNOSIS — I255 Ischemic cardiomyopathy: Secondary | ICD-10-CM

## 2017-07-07 NOTE — Progress Notes (Signed)
Remote ICD transmission.   

## 2017-07-08 ENCOUNTER — Encounter: Payer: Self-pay | Admitting: Cardiology

## 2017-07-17 ENCOUNTER — Other Ambulatory Visit: Payer: Self-pay | Admitting: Internal Medicine

## 2017-07-17 DIAGNOSIS — I5022 Chronic systolic (congestive) heart failure: Secondary | ICD-10-CM

## 2017-07-17 LAB — CUP PACEART REMOTE DEVICE CHECK
Brady Statistic AP VP Percent: 0 %
Brady Statistic AP VS Percent: 0 %
Brady Statistic AS VP Percent: 0.01 %
Brady Statistic AS VS Percent: 99.99 %
Brady Statistic RV Percent Paced: 0.01 %
HighPow Impedance: 65 Ohm
Implantable Lead Implant Date: 20150107
Implantable Lead Implant Date: 20150107
Implantable Lead Location: 753857
Implantable Lead Location: 753859
Implantable Lead Location: 753860
Implantable Lead Model: 4298
Implantable Lead Model: 5076
Implantable Lead Model: 6935
Lead Channel Impedance Value: 342 Ohm
Lead Channel Impedance Value: 380 Ohm
Lead Channel Impedance Value: 399 Ohm
Lead Channel Impedance Value: 456 Ohm
Lead Channel Impedance Value: 494 Ohm
Lead Channel Impedance Value: 779 Ohm
Lead Channel Impedance Value: 817 Ohm
Lead Channel Pacing Threshold Amplitude: 0.75 V
Lead Channel Pacing Threshold Amplitude: 0.75 V
Lead Channel Pacing Threshold Amplitude: 5.5 V
Lead Channel Pacing Threshold Pulse Width: 0.8 ms
Lead Channel Sensing Intrinsic Amplitude: 2.375 mV
Lead Channel Sensing Intrinsic Amplitude: 2.375 mV
Lead Channel Setting Sensing Sensitivity: 0.3 mV
MDC IDC LEAD IMPLANT DT: 20150107
MDC IDC MSMT BATTERY REMAINING LONGEVITY: 29 mo
MDC IDC MSMT BATTERY VOLTAGE: 2.94 V
MDC IDC MSMT LEADCHNL LV IMPEDANCE VALUE: 437 Ohm
MDC IDC MSMT LEADCHNL LV IMPEDANCE VALUE: 456 Ohm
MDC IDC MSMT LEADCHNL LV IMPEDANCE VALUE: 665 Ohm
MDC IDC MSMT LEADCHNL LV IMPEDANCE VALUE: 703 Ohm
MDC IDC MSMT LEADCHNL LV IMPEDANCE VALUE: 779 Ohm
MDC IDC MSMT LEADCHNL RA IMPEDANCE VALUE: 456 Ohm
MDC IDC MSMT LEADCHNL RA PACING THRESHOLD PULSEWIDTH: 0.4 ms
MDC IDC MSMT LEADCHNL RV PACING THRESHOLD PULSEWIDTH: 0.4 ms
MDC IDC MSMT LEADCHNL RV SENSING INTR AMPL: 27.375 mV
MDC IDC MSMT LEADCHNL RV SENSING INTR AMPL: 27.375 mV
MDC IDC PG IMPLANT DT: 20150107
MDC IDC SESS DTM: 20190212093826
MDC IDC SET LEADCHNL RV PACING AMPLITUDE: 2 V
MDC IDC SET LEADCHNL RV PACING PULSEWIDTH: 0.4 ms
MDC IDC STAT BRADY RA PERCENT PACED: 0 %

## 2017-07-17 MED ORDER — METOPROLOL TARTRATE 25 MG PO TABS: 25.0000 mg | ORAL_TABLET | Freq: Two times a day (BID) | ORAL | 3 refills | Status: DC

## 2017-08-26 DIAGNOSIS — E039 Hypothyroidism, unspecified: Secondary | ICD-10-CM | POA: Diagnosis not present

## 2017-08-26 DIAGNOSIS — I5189 Other ill-defined heart diseases: Secondary | ICD-10-CM | POA: Diagnosis not present

## 2017-08-26 DIAGNOSIS — N390 Urinary tract infection, site not specified: Secondary | ICD-10-CM | POA: Diagnosis not present

## 2017-09-01 ENCOUNTER — Other Ambulatory Visit: Payer: Self-pay | Admitting: *Deleted

## 2017-09-01 MED ORDER — LISINOPRIL 5 MG PO TABS: 5.0000 mg | ORAL_TABLET | Freq: Every day | ORAL | 2 refills | Status: DC

## 2017-09-17 DIAGNOSIS — N390 Urinary tract infection, site not specified: Secondary | ICD-10-CM | POA: Diagnosis not present

## 2017-09-17 DIAGNOSIS — E785 Hyperlipidemia, unspecified: Secondary | ICD-10-CM | POA: Diagnosis not present

## 2017-09-17 DIAGNOSIS — E118 Type 2 diabetes mellitus with unspecified complications: Secondary | ICD-10-CM | POA: Diagnosis not present

## 2017-09-17 DIAGNOSIS — I5189 Other ill-defined heart diseases: Secondary | ICD-10-CM | POA: Diagnosis not present

## 2017-10-05 DIAGNOSIS — E785 Hyperlipidemia, unspecified: Secondary | ICD-10-CM | POA: Diagnosis not present

## 2017-10-05 DIAGNOSIS — E118 Type 2 diabetes mellitus with unspecified complications: Secondary | ICD-10-CM | POA: Diagnosis not present

## 2017-10-05 DIAGNOSIS — N390 Urinary tract infection, site not specified: Secondary | ICD-10-CM | POA: Diagnosis not present

## 2017-10-05 DIAGNOSIS — I5189 Other ill-defined heart diseases: Secondary | ICD-10-CM | POA: Diagnosis not present

## 2017-10-06 ENCOUNTER — Ambulatory Visit (INDEPENDENT_AMBULATORY_CARE_PROVIDER_SITE_OTHER): Payer: Medicare Other | Admitting: *Deleted

## 2017-10-06 DIAGNOSIS — I5022 Chronic systolic (congestive) heart failure: Secondary | ICD-10-CM

## 2017-10-06 DIAGNOSIS — I255 Ischemic cardiomyopathy: Secondary | ICD-10-CM

## 2017-10-07 NOTE — Progress Notes (Signed)
Remote ICD transmission.   

## 2017-10-08 LAB — CUP PACEART REMOTE DEVICE CHECK
Brady Statistic AS VP Percent: 0.02 %
Brady Statistic RA Percent Paced: 0 %
HighPow Impedance: 66 Ohm
Implantable Lead Implant Date: 20150107
Implantable Lead Implant Date: 20150107
Implantable Lead Location: 753857
Implantable Lead Location: 753860
Implantable Lead Model: 5076
Implantable Pulse Generator Implant Date: 20150107
Lead Channel Impedance Value: 380 Ohm
Lead Channel Impedance Value: 399 Ohm
Lead Channel Impedance Value: 399 Ohm
Lead Channel Impedance Value: 456 Ohm
Lead Channel Impedance Value: 494 Ohm
Lead Channel Impedance Value: 665 Ohm
Lead Channel Impedance Value: 722 Ohm
Lead Channel Impedance Value: 779 Ohm
Lead Channel Pacing Threshold Amplitude: 0.75 V
Lead Channel Pacing Threshold Amplitude: 5.5 V
Lead Channel Pacing Threshold Pulse Width: 0.4 ms
Lead Channel Pacing Threshold Pulse Width: 0.4 ms
Lead Channel Pacing Threshold Pulse Width: 0.8 ms
Lead Channel Sensing Intrinsic Amplitude: 31.625 mV
Lead Channel Setting Pacing Pulse Width: 0.4 ms
Lead Channel Setting Sensing Sensitivity: 0.3 mV
MDC IDC LEAD IMPLANT DT: 20150107
MDC IDC LEAD LOCATION: 753859
MDC IDC MSMT BATTERY REMAINING LONGEVITY: 28 mo
MDC IDC MSMT BATTERY VOLTAGE: 2.94 V
MDC IDC MSMT LEADCHNL LV IMPEDANCE VALUE: 342 Ohm
MDC IDC MSMT LEADCHNL LV IMPEDANCE VALUE: 437 Ohm
MDC IDC MSMT LEADCHNL LV IMPEDANCE VALUE: 513 Ohm
MDC IDC MSMT LEADCHNL LV IMPEDANCE VALUE: 646 Ohm
MDC IDC MSMT LEADCHNL LV IMPEDANCE VALUE: 817 Ohm
MDC IDC MSMT LEADCHNL RA SENSING INTR AMPL: 1.875 mV
MDC IDC MSMT LEADCHNL RA SENSING INTR AMPL: 1.875 mV
MDC IDC MSMT LEADCHNL RV PACING THRESHOLD AMPLITUDE: 0.75 V
MDC IDC MSMT LEADCHNL RV SENSING INTR AMPL: 31.625 mV
MDC IDC SESS DTM: 20190514073624
MDC IDC SET LEADCHNL RV PACING AMPLITUDE: 2 V
MDC IDC STAT BRADY AP VP PERCENT: 0 %
MDC IDC STAT BRADY AP VS PERCENT: 0 %
MDC IDC STAT BRADY AS VS PERCENT: 99.98 %
MDC IDC STAT BRADY RV PERCENT PACED: 0.02 %

## 2017-12-09 DIAGNOSIS — E785 Hyperlipidemia, unspecified: Secondary | ICD-10-CM | POA: Diagnosis not present

## 2017-12-09 DIAGNOSIS — I639 Cerebral infarction, unspecified: Secondary | ICD-10-CM | POA: Diagnosis not present

## 2017-12-09 DIAGNOSIS — I5189 Other ill-defined heart diseases: Secondary | ICD-10-CM | POA: Diagnosis not present

## 2017-12-09 DIAGNOSIS — E118 Type 2 diabetes mellitus with unspecified complications: Secondary | ICD-10-CM | POA: Diagnosis not present

## 2017-12-24 IMAGING — DX DG CHEST 2V
2 series · 2 of 2 positions shown · non-contrast
Comparison: Chest x-ray of October 10, 2014, and chest CT scan of
January 19, 2013.

CLINICAL DATA: She choked on a piece of chicken today with near
syncopal episode relieved by dislodging the chicken from the throat.
The patient began coughing there after. Clinically intoxicated.

EXAM:
CHEST  2 VIEW

[chest lat]
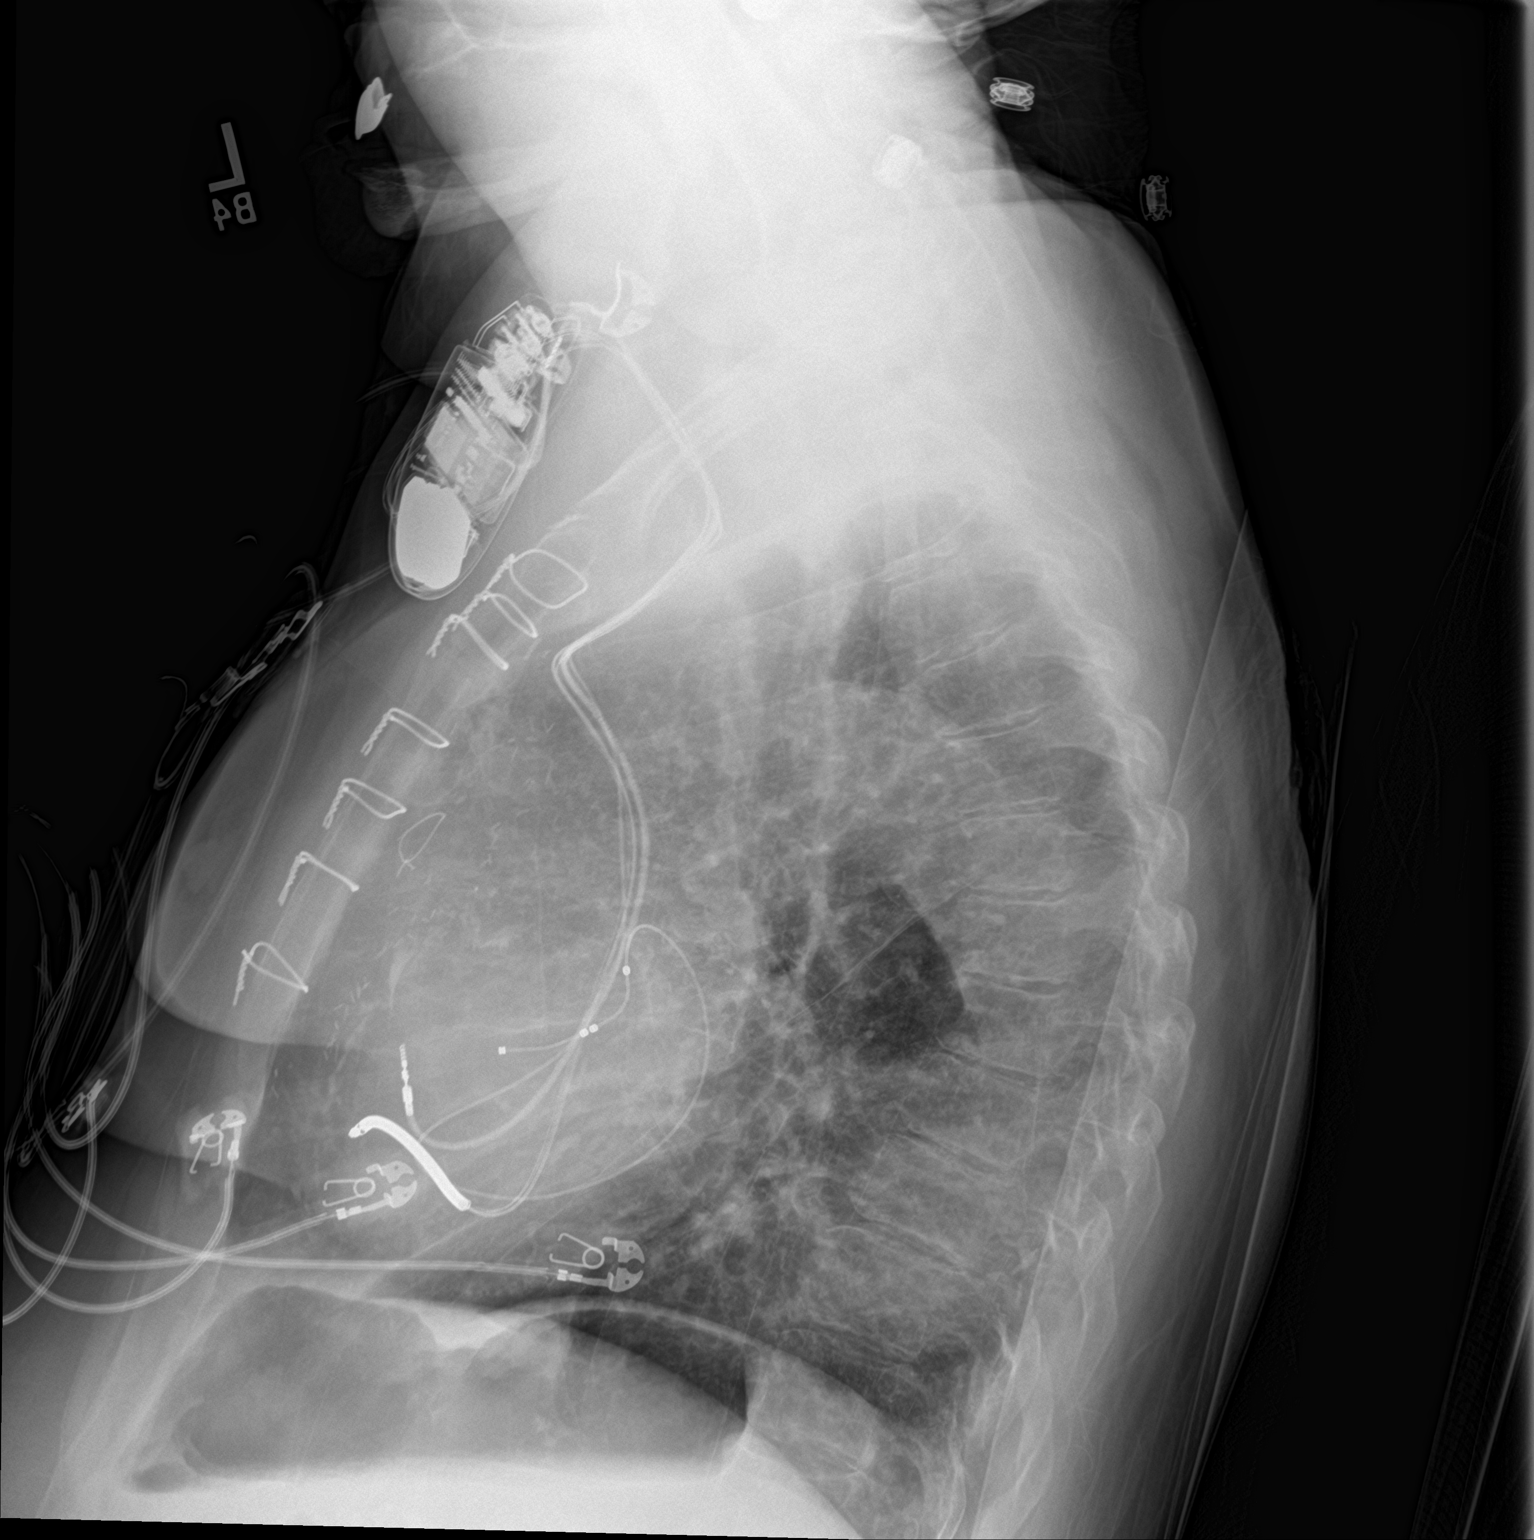

[chest ap]
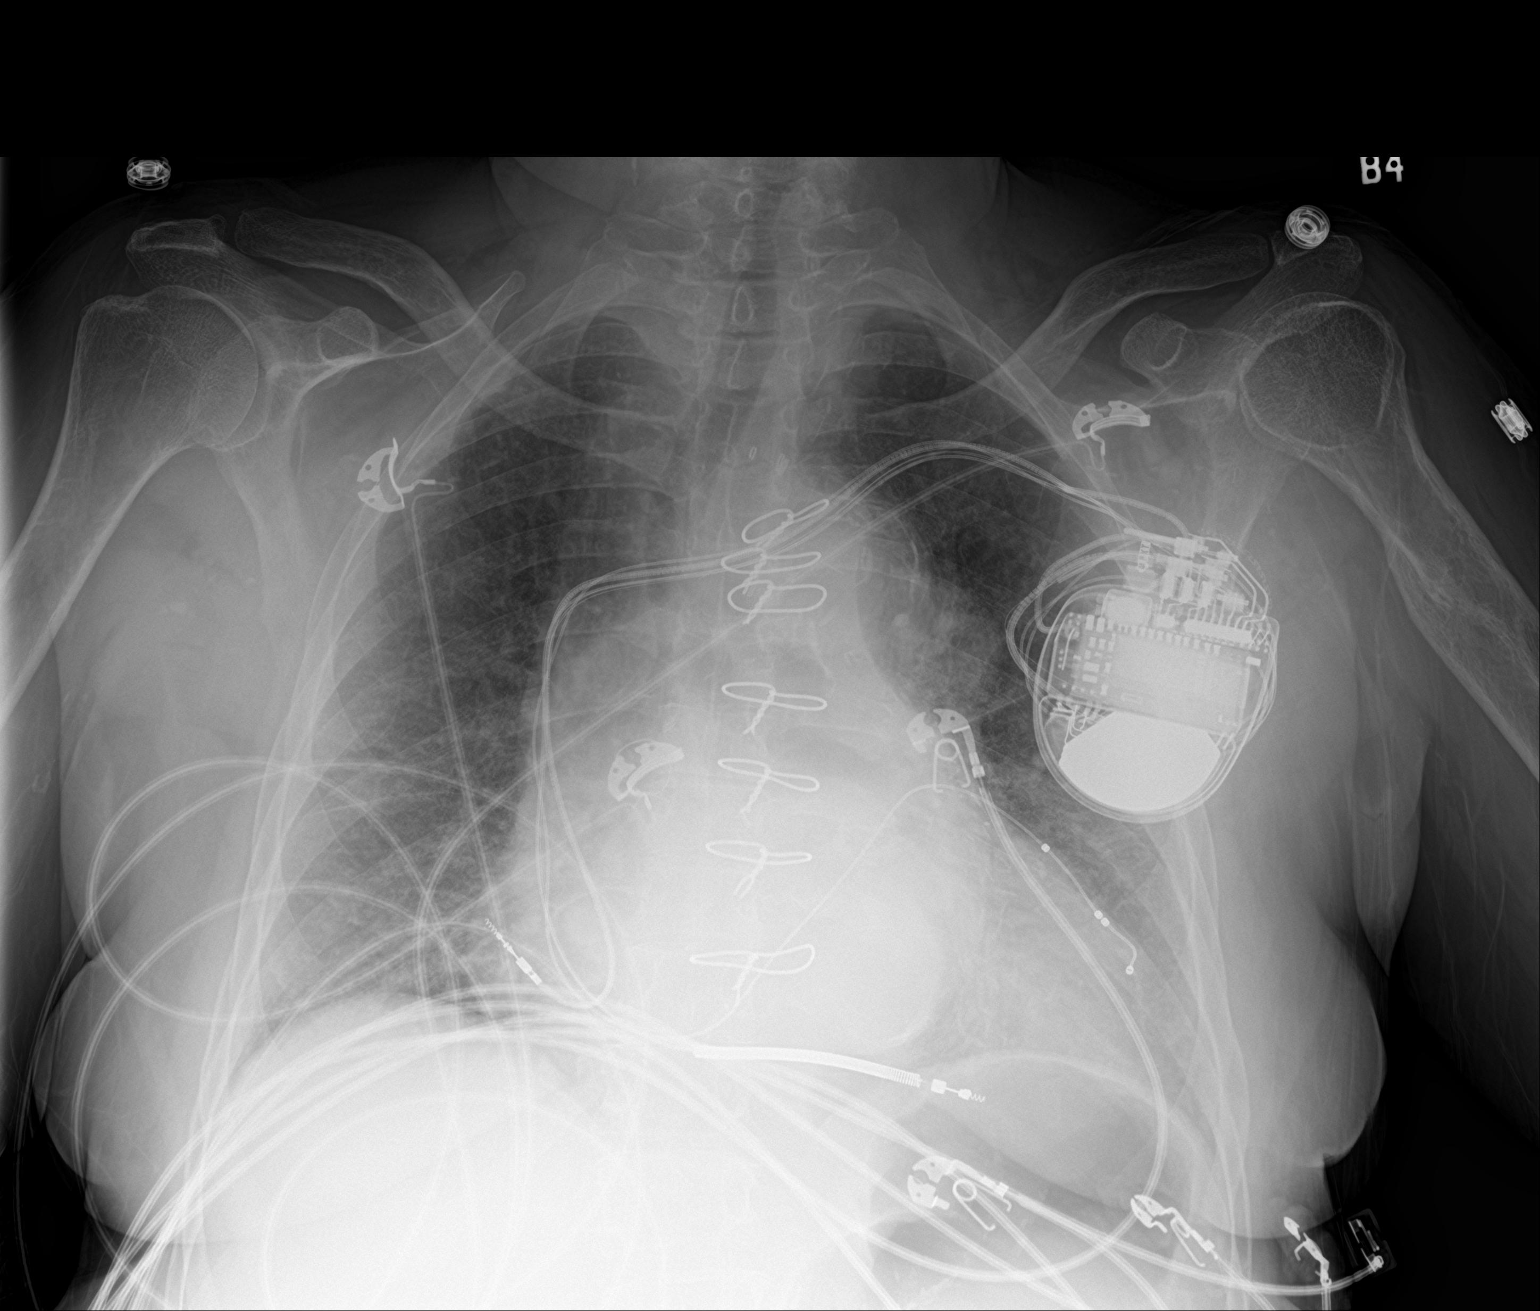

[2 of 2 positions shown; findings below may reference images not displayed]

FINDINGS: The lungs are well-expanded and clear. The cardiac silhouette is
enlarged. The pulmonary vascularity is engorged. The ICD is in
stable position. The sternal wires are intact. There is
calcification in the wall of the tortuous thoracic aorta. There is a
large hiatal hernia. There is multilevel degenerative disc disease
of the thoracic spine.
IMPRESSION: There is no evidence of acute pneumonia. Stable cardiomegaly without
pulmonary vascular congestion.

Thoracic aortic atherosclerosis.  Known thoracic aortic aneurysm

Air within the esophagus or large hiatal hernia.

## 2018-01-06 ENCOUNTER — Ambulatory Visit (INDEPENDENT_AMBULATORY_CARE_PROVIDER_SITE_OTHER): Payer: Medicare Other | Admitting: *Deleted

## 2018-01-06 DIAGNOSIS — I255 Ischemic cardiomyopathy: Secondary | ICD-10-CM

## 2018-01-06 DIAGNOSIS — I5022 Chronic systolic (congestive) heart failure: Secondary | ICD-10-CM | POA: Diagnosis not present

## 2018-01-06 NOTE — Progress Notes (Signed)
Remote ICD transmission.   

## 2018-01-31 LAB — CUP PACEART REMOTE DEVICE CHECK
Brady Statistic AP VP Percent: 0 %
Brady Statistic AP VS Percent: 0 %
Brady Statistic AS VP Percent: 0.06 %
Brady Statistic AS VS Percent: 99.94 %
Brady Statistic RV Percent Paced: 0.07 %
Date Time Interrogation Session: 20190814083723
HighPow Impedance: 66 Ohm
Implantable Lead Implant Date: 20150107
Implantable Lead Implant Date: 20150107
Implantable Lead Implant Date: 20150107
Implantable Lead Location: 753859
Implantable Lead Location: 753860
Implantable Lead Model: 5076
Implantable Lead Model: 6935
Implantable Pulse Generator Implant Date: 20150107
Lead Channel Impedance Value: 380 Ohm
Lead Channel Impedance Value: 399 Ohm
Lead Channel Impedance Value: 399 Ohm
Lead Channel Impedance Value: 399 Ohm
Lead Channel Impedance Value: 437 Ohm
Lead Channel Impedance Value: 437 Ohm
Lead Channel Impedance Value: 513 Ohm
Lead Channel Impedance Value: 665 Ohm
Lead Channel Impedance Value: 703 Ohm
Lead Channel Impedance Value: 779 Ohm
Lead Channel Impedance Value: 779 Ohm
Lead Channel Pacing Threshold Amplitude: 0.75 V
Lead Channel Pacing Threshold Amplitude: 0.875 V
Lead Channel Pacing Threshold Amplitude: 5.5 V
Lead Channel Pacing Threshold Pulse Width: 0.4 ms
Lead Channel Pacing Threshold Pulse Width: 0.8 ms
Lead Channel Sensing Intrinsic Amplitude: 2.375 mV
Lead Channel Sensing Intrinsic Amplitude: 31.625 mV
Lead Channel Sensing Intrinsic Amplitude: 31.625 mV
Lead Channel Setting Pacing Amplitude: 2 V
Lead Channel Setting Pacing Pulse Width: 0.4 ms
Lead Channel Setting Sensing Sensitivity: 0.3 mV
MDC IDC LEAD LOCATION: 753857
MDC IDC MSMT BATTERY REMAINING LONGEVITY: 27 mo
MDC IDC MSMT BATTERY VOLTAGE: 2.94 V
MDC IDC MSMT LEADCHNL LV IMPEDANCE VALUE: 779 Ohm
MDC IDC MSMT LEADCHNL RA IMPEDANCE VALUE: 456 Ohm
MDC IDC MSMT LEADCHNL RA SENSING INTR AMPL: 2.375 mV
MDC IDC MSMT LEADCHNL RV PACING THRESHOLD PULSEWIDTH: 0.4 ms
MDC IDC STAT BRADY RA PERCENT PACED: 0 %

## 2018-02-09 DIAGNOSIS — E785 Hyperlipidemia, unspecified: Secondary | ICD-10-CM | POA: Diagnosis not present

## 2018-02-09 DIAGNOSIS — Z6824 Body mass index (BMI) 24.0-24.9, adult: Secondary | ICD-10-CM | POA: Diagnosis not present

## 2018-02-09 DIAGNOSIS — E118 Type 2 diabetes mellitus with unspecified complications: Secondary | ICD-10-CM | POA: Diagnosis not present

## 2018-02-09 DIAGNOSIS — I5189 Other ill-defined heart diseases: Secondary | ICD-10-CM | POA: Diagnosis not present

## 2018-02-09 DIAGNOSIS — I639 Cerebral infarction, unspecified: Secondary | ICD-10-CM | POA: Diagnosis not present

## 2018-03-08 ENCOUNTER — Other Ambulatory Visit: Payer: Self-pay

## 2018-03-08 NOTE — Patient Outreach (Signed)
[facility]) Care Management  03/08/2018  Kathryn Dixon [birth year] [id number]   Medication Adherence call to [name] left a message for patient to call back patient is due on Rosuvastatin 20 mg under Kathryn Dixon.   [facility] Management Direct Dial [phone]  Fax [phone] [email or web address]

## 2018-03-11 DIAGNOSIS — E119 Type 2 diabetes mellitus without complications: Secondary | ICD-10-CM | POA: Diagnosis not present

## 2018-03-11 DIAGNOSIS — Z0100 Encounter for examination of eyes and vision without abnormal findings: Secondary | ICD-10-CM | POA: Diagnosis not present

## 2018-04-07 ENCOUNTER — Ambulatory Visit (INDEPENDENT_AMBULATORY_CARE_PROVIDER_SITE_OTHER): Payer: Medicare Other | Admitting: *Deleted

## 2018-04-07 DIAGNOSIS — N390 Urinary tract infection, site not specified: Secondary | ICD-10-CM | POA: Diagnosis not present

## 2018-04-07 DIAGNOSIS — I639 Cerebral infarction, unspecified: Secondary | ICD-10-CM | POA: Diagnosis not present

## 2018-04-07 DIAGNOSIS — I5022 Chronic systolic (congestive) heart failure: Secondary | ICD-10-CM | POA: Diagnosis not present

## 2018-04-07 DIAGNOSIS — I255 Ischemic cardiomyopathy: Secondary | ICD-10-CM

## 2018-04-07 DIAGNOSIS — I5189 Other ill-defined heart diseases: Secondary | ICD-10-CM | POA: Diagnosis not present

## 2018-04-07 DIAGNOSIS — E785 Hyperlipidemia, unspecified: Secondary | ICD-10-CM | POA: Diagnosis not present

## 2018-04-07 DIAGNOSIS — Z0000 Encounter for general adult medical examination without abnormal findings: Secondary | ICD-10-CM | POA: Diagnosis not present

## 2018-04-07 NOTE — Progress Notes (Signed)
Remote ICD transmission.   

## 2018-05-21 ENCOUNTER — Encounter: Payer: Self-pay | Admitting: Internal Medicine

## 2018-05-23 ENCOUNTER — Other Ambulatory Visit: Payer: Self-pay | Admitting: Internal Medicine

## 2018-06-06 LAB — CUP PACEART REMOTE DEVICE CHECK
Battery Remaining Longevity: 26 mo
Battery Voltage: 2.92 V
Brady Statistic AP VP Percent: 0 %
Brady Statistic RV Percent Paced: 0.12 %
Date Time Interrogation Session: 20191113102604
HighPow Impedance: 69 Ohm
Implantable Lead Implant Date: 20150107
Implantable Lead Location: 753859
Implantable Lead Model: 4298
Implantable Lead Model: 5076
Implantable Lead Model: 6935
Lead Channel Impedance Value: 342 Ohm
Lead Channel Impedance Value: 342 Ohm
Lead Channel Impedance Value: 380 Ohm
Lead Channel Impedance Value: 399 Ohm
Lead Channel Impedance Value: 513 Ohm
Lead Channel Impedance Value: 589 Ohm
Lead Channel Pacing Threshold Amplitude: 0.75 V
Lead Channel Sensing Intrinsic Amplitude: 2.875 mV
Lead Channel Sensing Intrinsic Amplitude: 26 mV
Lead Channel Setting Sensing Sensitivity: 0.3 mV
MDC IDC LEAD IMPLANT DT: 20150107
MDC IDC LEAD IMPLANT DT: 20150107
MDC IDC LEAD LOCATION: 753857
MDC IDC LEAD LOCATION: 753860
MDC IDC MSMT LEADCHNL LV IMPEDANCE VALUE: 399 Ohm
MDC IDC MSMT LEADCHNL LV IMPEDANCE VALUE: 627 Ohm
MDC IDC MSMT LEADCHNL LV IMPEDANCE VALUE: 722 Ohm
MDC IDC MSMT LEADCHNL LV IMPEDANCE VALUE: 760 Ohm
MDC IDC MSMT LEADCHNL LV IMPEDANCE VALUE: 760 Ohm
MDC IDC MSMT LEADCHNL LV PACING THRESHOLD AMPLITUDE: 5.5 V
MDC IDC MSMT LEADCHNL LV PACING THRESHOLD PULSEWIDTH: 0.8 ms
MDC IDC MSMT LEADCHNL RA IMPEDANCE VALUE: 456 Ohm
MDC IDC MSMT LEADCHNL RA PACING THRESHOLD AMPLITUDE: 0.75 V
MDC IDC MSMT LEADCHNL RA PACING THRESHOLD PULSEWIDTH: 0.4 ms
MDC IDC MSMT LEADCHNL RA SENSING INTR AMPL: 2.875 mV
MDC IDC MSMT LEADCHNL RV IMPEDANCE VALUE: 399 Ohm
MDC IDC MSMT LEADCHNL RV PACING THRESHOLD PULSEWIDTH: 0.4 ms
MDC IDC MSMT LEADCHNL RV SENSING INTR AMPL: 26 mV
MDC IDC PG IMPLANT DT: 20150107
MDC IDC SET LEADCHNL RV PACING AMPLITUDE: 2 V
MDC IDC SET LEADCHNL RV PACING PULSEWIDTH: 0.4 ms
MDC IDC STAT BRADY AP VS PERCENT: 0 %
MDC IDC STAT BRADY AS VP PERCENT: 0.1 %
MDC IDC STAT BRADY AS VS PERCENT: 99.9 %
MDC IDC STAT BRADY RA PERCENT PACED: 0 %

## 2018-06-18 ENCOUNTER — Encounter: Payer: Self-pay | Admitting: Internal Medicine

## 2018-06-18 ENCOUNTER — Encounter (INDEPENDENT_AMBULATORY_CARE_PROVIDER_SITE_OTHER): Payer: Self-pay

## 2018-06-18 ENCOUNTER — Ambulatory Visit (INDEPENDENT_AMBULATORY_CARE_PROVIDER_SITE_OTHER): Payer: Medicare Other | Admitting: Internal Medicine

## 2018-06-18 VITALS — BP 124/82 | HR 66 | Ht 64.0 in | Wt 132.6 lb

## 2018-06-18 DIAGNOSIS — I255 Ischemic cardiomyopathy: Secondary | ICD-10-CM | POA: Diagnosis not present

## 2018-06-18 DIAGNOSIS — I5022 Chronic systolic (congestive) heart failure: Secondary | ICD-10-CM

## 2018-06-18 DIAGNOSIS — Z95810 Presence of automatic (implantable) cardiac defibrillator: Secondary | ICD-10-CM

## 2018-06-18 LAB — CUP PACEART INCLINIC DEVICE CHECK
Implantable Lead Implant Date: 20150107
Implantable Lead Implant Date: 20150107
Implantable Lead Implant Date: 20150107
Implantable Lead Location: 753857
Implantable Lead Model: 4298
Implantable Lead Model: 6935
Implantable Pulse Generator Implant Date: 20150107
MDC IDC LEAD LOCATION: 753859
MDC IDC LEAD LOCATION: 753860
MDC IDC SESS DTM: 20200124153701

## 2018-06-18 NOTE — Patient Instructions (Signed)
Medication Instructions:  Your physician recommends that you continue on your current medications as directed. Please refer to the Current Medication list given to you today.  Labwork: None ordered.  Testing/Procedures: None ordered.  Follow-Up: Your physician wants you to follow-up in: one year with Dr. Lovena Le.   You will receive a reminder letter in the mail two months in advance. If you don't receive a letter, please call our office to schedule the follow-up appointment.  Remote monitoring is used to monitor your ICD from home. This monitoring reduces the number of office visits required to check your device to one time per year. It allows [place] to keep an eye on the functioning of your device to ensure it is working properly. You are scheduled for a device check from home on 07/07/2018. You may send your transmission at any time that day. If you have a wireless device, the transmission will be sent automatically. After your physician reviews your transmission, you will receive a postcard with your next transmission date.  Any Other Special Instructions Will Be Listed Below (If Applicable).  If you need a refill on your cardiac medications before your next appointment, please call your pharmacy.

## 2018-06-18 NOTE — Progress Notes (Signed)
HPI Kathryn Dixon returns today for followup. She is a pleasant 72yo woman with an ICM, chronic systolic heart failure, LBBB, s/p BiV ICD. In the interim, she has done well. Since she broke her foot, he has been sedentary. No chest pain. No ICD shock. She was noted to have an increase in her LV pacing threshold and this resulted in diaphragmatic stimulation. She has become even more sedentary due to instability when she walks. No chest pain or sob.  No Known Allergies   Current Outpatient Medications  Medication Sig Dispense Refill  . aspirin EC 81 MG tablet Take 81 mg by mouth daily.    . hydrochlorothiazide (MICROZIDE) 12.5 MG capsule Take 1 capsule (12.5 mg total) by mouth daily. 90 capsule 3  . lisinopril (PRINIVIL,ZESTRIL) 5 MG tablet Take 1 tablet (5 mg total) by mouth daily. Please keep upcoming appt for future refills. Thank you. 90 tablet 0  . metoprolol tartrate (LOPRESSOR) 50 MG tablet Take 25 mg by mouth 2 (two) times daily.    . rosuvastatin (CRESTOR) 20 MG tablet Take 20 mg by mouth daily.    . vitamin E 200 UNIT capsule Take 200 Units by mouth daily.     No current facility-administered medications for this visit.      Past Medical History:  Diagnosis Date  . Coronary artery disease   . Exertional shortness of breath   . Hypertension   . Ischemic cardiomyopathy   . LBBB (left bundle branch block)   . Myocardial infarction ([place]) 1990's;01/2013   ; "mild, right before OHS"  . S/P CABG x 2 - LIMA-LAD, SVG-OM, 01/27/13 01/28/2013  . Smoker   . Thoracic aneurysm without mention of rupture     ROS:   All systems reviewed and negative except as noted in the HPI.   Past Surgical History:  Procedure Laterality Date  . ABDOMINAL HYSTERECTOMY    . BI-VENTRICULAR IMPLANTABLE CARDIOVERTER DEFIBRILLATOR N/A 06/01/2013   Procedure: BI-VENTRICULAR IMPLANTABLE CARDIOVERTER DEFIBRILLATOR  (CRT-D);  Surgeon: Evans Lance, MD;  Location: [facility] CATH LAB;  Service:  Cardiovascular;  Laterality: N/A;  . COLONOSCOPY WITH PROPOFOL N/A 10/07/2016   Procedure: COLONOSCOPY WITH PROPOFOL;  Surgeon: Manus Gunning, MD;  Location: [facility] ENDOSCOPY;  Service: Gastroenterology;  Laterality: N/A;  . CORONARY ARTERY BYPASS GRAFT N/A 01/27/2013   Procedure: CORONARY ARTERY BYPASS GRAFTING times two using Right Greater Saphenous Vein Graft Harvsted Endoscopically and Left Internal Mammary Artery;  Surgeon: Gaye Pollack, MD;  Location: [place] OR;  Service: Open Heart Surgery;  Laterality: N/A;  . DILATION AND CURETTAGE OF UTERUS    . FRACTURE SURGERY    . INTRAOPERATIVE TRANSESOPHAGEAL ECHOCARDIOGRAM N/A 01/27/2013   Procedure: INTRAOPERATIVE TRANSESOPHAGEAL ECHOCARDIOGRAM;  Surgeon: Gaye Pollack, MD;  Location: [facility] OR;  Service: Open Heart Surgery;  Laterality: N/A;  . LEFT HEART CATHETERIZATION WITH CORONARY ANGIOGRAM N/A 01/20/2013   Procedure: LEFT HEART CATHETERIZATION WITH CORONARY ANGIOGRAM;  Surgeon: Leonie Man, MD;  Location: [facility] CATH LAB;  Service: Cardiovascular;  Laterality: N/A;  . RIGHT HEART CATHETERIZATION  01/20/2013   Procedure: RIGHT HEART CATH;  Surgeon: Leonie Man, MD;  Location: [facility] CATH LAB;  Service: Cardiovascular;;  . TUBAL LIGATION       Family History  Problem Relation Age of Onset  . Hypertension Mother   . Heart disease Father   . Throat cancer Brother   . Brain cancer Brother   . Colon cancer Neg Hx   .  Stomach cancer Neg Hx   . Rectal cancer Neg Hx   . Liver cancer Neg Hx      Social History   Socioeconomic History  . Marital status: Divorced    Spouse name: Not on file  . Number of children: 3  . Years of education: Not on file  . Highest education level: Not on file  Occupational History  . Occupation: retired  Scientific laboratory technician  . Financial resource strain: Not on file  . Food insecurity:    Worry: Not on file    Inability: Not on file  . Transportation needs:    Medical: Not on file    Non-medical: Not on file    Tobacco Use  . Smoking status: Current Some Day Smoker    Packs/day: 0.12    Years: 50.00    Pack years: 6.00    Types: Cigarettes  . Smokeless tobacco: Never Used  Substance and Sexual Activity  . Alcohol use: Yes    Comment: 06/01/2013 "couple times/yr I'll have 1-2 drinks"  . Drug use: No  . Sexual activity: Not Currently    Birth control/protection: Surgical  Lifestyle  . Physical activity:    Days per week: Not on file    Minutes per session: Not on file  . Stress: Not on file  Relationships  . Social connections:    Talks on phone: Not on file    Gets together: Not on file    Attends religious service: Not on file    Active member of club or organization: Not on file    Attends meetings of clubs or organizations: Not on file    Relationship status: Not on file  . Intimate partner violence:    Fear of current or ex partner: Not on file    Emotionally abused: Not on file    Physically abused: Not on file    Forced sexual activity: Not on file  Other Topics Concern  . Not on file  Social History Narrative  . Not on file     BP 124/82   Pulse 66   Ht 5\' 4"  (1.626 m)   Wt 132 lb 9.6 oz (60.1 kg)   SpO2 99%   BMI 22.76 kg/m   Physical Exam:  Well appearing NAD HEENT: Unremarkable Neck:  No JVD, no thyromegally Lymphatics:  No adenopathy Back:  No CVA tenderness Lungs:  Clear HEART:  Regular rate rhythm, no murmurs, no rubs, no clicks Abd:  soft, positive bowel sounds, no organomegally, no rebound, no guarding Ext:  2 plus pulses, no edema, no cyanosis, no clubbing Skin:  No rashes no nodules Neuro:  CN II through XII intact, motor grossly intact  EKG - nsr with LBBB  DEVICE  Normal device function.  See PaceArt for details.   Assess/Plan: 1. Chronic systolic heart failure - her symptoms are class 2. She will continue her current meds.  2. ICD - her Medtronic biv device is working normally. 3. HTN - her blood pressure is working normally.   Mikle Bosworth.D.

## 2018-07-07 ENCOUNTER — Ambulatory Visit (INDEPENDENT_AMBULATORY_CARE_PROVIDER_SITE_OTHER): Payer: Medicare Other

## 2018-07-07 DIAGNOSIS — I5022 Chronic systolic (congestive) heart failure: Secondary | ICD-10-CM

## 2018-07-07 DIAGNOSIS — I255 Ischemic cardiomyopathy: Secondary | ICD-10-CM | POA: Diagnosis not present

## 2018-07-07 LAB — CUP PACEART REMOTE DEVICE CHECK
Battery Remaining Longevity: 23 mo
Battery Voltage: 2.92 V
Brady Statistic AP VP Percent: 0 %
Brady Statistic AS VS Percent: 99.93 %
Date Time Interrogation Session: 20200212092407
HIGH POWER IMPEDANCE MEASURED VALUE: 61 Ohm
Implantable Lead Implant Date: 20150107
Implantable Lead Location: 753859
Implantable Lead Location: 753860
Implantable Lead Model: 4298
Implantable Lead Model: 5076
Implantable Pulse Generator Implant Date: 20150107
Lead Channel Impedance Value: 323 Ohm
Lead Channel Impedance Value: 342 Ohm
Lead Channel Impedance Value: 380 Ohm
Lead Channel Impedance Value: 437 Ohm
Lead Channel Impedance Value: 570 Ohm
Lead Channel Impedance Value: 627 Ohm
Lead Channel Pacing Threshold Amplitude: 0.75 V
Lead Channel Pacing Threshold Amplitude: 5.5 V
Lead Channel Pacing Threshold Pulse Width: 0.8 ms
Lead Channel Sensing Intrinsic Amplitude: 1.75 mV
Lead Channel Sensing Intrinsic Amplitude: 25.625 mV
Lead Channel Setting Pacing Amplitude: 2 V
Lead Channel Setting Pacing Pulse Width: 0.4 ms
Lead Channel Setting Sensing Sensitivity: 0.3 mV
MDC IDC LEAD IMPLANT DT: 20150107
MDC IDC LEAD IMPLANT DT: 20150107
MDC IDC LEAD LOCATION: 753857
MDC IDC MSMT LEADCHNL LV IMPEDANCE VALUE: 342 Ohm
MDC IDC MSMT LEADCHNL LV IMPEDANCE VALUE: 380 Ohm
MDC IDC MSMT LEADCHNL LV IMPEDANCE VALUE: 437 Ohm
MDC IDC MSMT LEADCHNL LV IMPEDANCE VALUE: 627 Ohm
MDC IDC MSMT LEADCHNL LV IMPEDANCE VALUE: 646 Ohm
MDC IDC MSMT LEADCHNL LV IMPEDANCE VALUE: 665 Ohm
MDC IDC MSMT LEADCHNL RA IMPEDANCE VALUE: 456 Ohm
MDC IDC MSMT LEADCHNL RA PACING THRESHOLD AMPLITUDE: 0.75 V
MDC IDC MSMT LEADCHNL RA PACING THRESHOLD PULSEWIDTH: 0.4 ms
MDC IDC MSMT LEADCHNL RA SENSING INTR AMPL: 1.75 mV
MDC IDC MSMT LEADCHNL RV PACING THRESHOLD PULSEWIDTH: 0.4 ms
MDC IDC MSMT LEADCHNL RV SENSING INTR AMPL: 25.625 mV
MDC IDC STAT BRADY AP VS PERCENT: 0 %
MDC IDC STAT BRADY AS VP PERCENT: 0.07 %
MDC IDC STAT BRADY RA PERCENT PACED: 0 %
MDC IDC STAT BRADY RV PERCENT PACED: 0.08 %

## 2018-07-20 NOTE — Progress Notes (Signed)
Remote ICD transmission.   

## 2018-08-03 DIAGNOSIS — I10 Essential (primary) hypertension: Secondary | ICD-10-CM | POA: Diagnosis not present

## 2018-08-03 DIAGNOSIS — I5189 Other ill-defined heart diseases: Secondary | ICD-10-CM | POA: Diagnosis not present

## 2018-08-03 DIAGNOSIS — I639 Cerebral infarction, unspecified: Secondary | ICD-10-CM | POA: Diagnosis not present

## 2018-08-03 DIAGNOSIS — E785 Hyperlipidemia, unspecified: Secondary | ICD-10-CM | POA: Diagnosis not present

## 2018-08-19 ENCOUNTER — Other Ambulatory Visit: Payer: Self-pay | Admitting: Internal Medicine

## 2018-08-20 ENCOUNTER — Other Ambulatory Visit: Payer: Self-pay | Admitting: Internal Medicine

## 2018-08-28 ENCOUNTER — Other Ambulatory Visit: Payer: Self-pay | Admitting: Internal Medicine

## 2018-10-06 ENCOUNTER — Other Ambulatory Visit: Payer: Self-pay

## 2018-10-06 ENCOUNTER — Ambulatory Visit (INDEPENDENT_AMBULATORY_CARE_PROVIDER_SITE_OTHER): Payer: Medicare Other | Admitting: *Deleted

## 2018-10-06 DIAGNOSIS — I255 Ischemic cardiomyopathy: Secondary | ICD-10-CM

## 2018-10-06 DIAGNOSIS — I5022 Chronic systolic (congestive) heart failure: Secondary | ICD-10-CM

## 2018-10-06 LAB — CUP PACEART REMOTE DEVICE CHECK
Battery Remaining Longevity: 20 mo
Battery Voltage: 2.9 V
Brady Statistic AP VP Percent: 0 %
Brady Statistic AP VS Percent: 0 %
Brady Statistic AS VP Percent: 0.07 %
Brady Statistic AS VS Percent: 99.93 %
Brady Statistic RA Percent Paced: 0 %
Brady Statistic RV Percent Paced: 0.09 %
Date Time Interrogation Session: 20200513103328
HighPow Impedance: 66 Ohm
Implantable Lead Implant Date: 20150107
Implantable Lead Implant Date: 20150107
Implantable Lead Implant Date: 20150107
Implantable Lead Location: 753857
Implantable Lead Location: 753859
Implantable Lead Location: 753860
Implantable Lead Model: 4298
Implantable Lead Model: 5076
Implantable Lead Model: 6935
Implantable Pulse Generator Implant Date: 20150107
Lead Channel Impedance Value: 342 Ohm
Lead Channel Impedance Value: 342 Ohm
Lead Channel Impedance Value: 342 Ohm
Lead Channel Impedance Value: 380 Ohm
Lead Channel Impedance Value: 380 Ohm
Lead Channel Impedance Value: 399 Ohm
Lead Channel Impedance Value: 437 Ohm
Lead Channel Impedance Value: 494 Ohm
Lead Channel Impedance Value: 627 Ohm
Lead Channel Impedance Value: 627 Ohm
Lead Channel Impedance Value: 665 Ohm
Lead Channel Impedance Value: 665 Ohm
Lead Channel Impedance Value: 703 Ohm
Lead Channel Pacing Threshold Amplitude: 0.75 V
Lead Channel Pacing Threshold Amplitude: 0.875 V
Lead Channel Pacing Threshold Amplitude: 5.5 V
Lead Channel Pacing Threshold Pulse Width: 0.4 ms
Lead Channel Pacing Threshold Pulse Width: 0.4 ms
Lead Channel Pacing Threshold Pulse Width: 0.8 ms
Lead Channel Sensing Intrinsic Amplitude: 2.125 mV
Lead Channel Sensing Intrinsic Amplitude: 2.125 mV
Lead Channel Sensing Intrinsic Amplitude: 26 mV
Lead Channel Sensing Intrinsic Amplitude: 26 mV
Lead Channel Setting Pacing Amplitude: 2 V
Lead Channel Setting Pacing Pulse Width: 0.4 ms
Lead Channel Setting Sensing Sensitivity: 0.3 mV

## 2018-10-25 NOTE — Progress Notes (Signed)
Remote ICD transmission.   

## 2018-11-29 DIAGNOSIS — N390 Urinary tract infection, site not specified: Secondary | ICD-10-CM | POA: Diagnosis not present

## 2018-11-29 DIAGNOSIS — I10 Essential (primary) hypertension: Secondary | ICD-10-CM | POA: Diagnosis not present

## 2018-12-15 DIAGNOSIS — I10 Essential (primary) hypertension: Secondary | ICD-10-CM | POA: Diagnosis not present

## 2018-12-15 DIAGNOSIS — E1169 Type 2 diabetes mellitus with other specified complication: Secondary | ICD-10-CM | POA: Diagnosis not present

## 2018-12-30 DIAGNOSIS — I10 Essential (primary) hypertension: Secondary | ICD-10-CM | POA: Diagnosis not present

## 2018-12-30 DIAGNOSIS — E1169 Type 2 diabetes mellitus with other specified complication: Secondary | ICD-10-CM | POA: Diagnosis not present

## 2018-12-30 DIAGNOSIS — N390 Urinary tract infection, site not specified: Secondary | ICD-10-CM | POA: Diagnosis not present

## 2018-12-30 DIAGNOSIS — I5189 Other ill-defined heart diseases: Secondary | ICD-10-CM | POA: Diagnosis not present

## 2019-01-05 ENCOUNTER — Ambulatory Visit (INDEPENDENT_AMBULATORY_CARE_PROVIDER_SITE_OTHER): Payer: Medicare Other | Admitting: *Deleted

## 2019-01-05 DIAGNOSIS — I255 Ischemic cardiomyopathy: Secondary | ICD-10-CM | POA: Diagnosis not present

## 2019-01-05 LAB — CUP PACEART REMOTE DEVICE CHECK
Battery Remaining Longevity: 17 mo
Battery Voltage: 2.89 V
Brady Statistic AP VP Percent: 0 %
Brady Statistic AP VS Percent: 0 %
Brady Statistic AS VP Percent: 0.05 %
Brady Statistic AS VS Percent: 99.95 %
Brady Statistic RA Percent Paced: 0 %
Brady Statistic RV Percent Paced: 0.06 %
Date Time Interrogation Session: 20200812082408
HighPow Impedance: 66 Ohm
Implantable Lead Implant Date: 20150107
Implantable Lead Implant Date: 20150107
Implantable Lead Implant Date: 20150107
Implantable Lead Location: 753857
Implantable Lead Location: 753859
Implantable Lead Location: 753860
Implantable Lead Model: 4298
Implantable Lead Model: 5076
Implantable Lead Model: 6935
Implantable Pulse Generator Implant Date: 20150107
Lead Channel Impedance Value: 323 Ohm
Lead Channel Impedance Value: 323 Ohm
Lead Channel Impedance Value: 323 Ohm
Lead Channel Impedance Value: 342 Ohm
Lead Channel Impedance Value: 342 Ohm
Lead Channel Impedance Value: 399 Ohm
Lead Channel Impedance Value: 456 Ohm
Lead Channel Impedance Value: 456 Ohm
Lead Channel Impedance Value: 532 Ohm
Lead Channel Impedance Value: 570 Ohm
Lead Channel Impedance Value: 589 Ohm
Lead Channel Impedance Value: 627 Ohm
Lead Channel Impedance Value: 646 Ohm
Lead Channel Pacing Threshold Amplitude: 0.75 V
Lead Channel Pacing Threshold Amplitude: 0.875 V
Lead Channel Pacing Threshold Amplitude: 5.5 V
Lead Channel Pacing Threshold Pulse Width: 0.4 ms
Lead Channel Pacing Threshold Pulse Width: 0.4 ms
Lead Channel Pacing Threshold Pulse Width: 0.8 ms
Lead Channel Sensing Intrinsic Amplitude: 2.625 mV
Lead Channel Sensing Intrinsic Amplitude: 2.625 mV
Lead Channel Sensing Intrinsic Amplitude: 31.625 mV
Lead Channel Sensing Intrinsic Amplitude: 31.625 mV
Lead Channel Setting Pacing Amplitude: 2 V
Lead Channel Setting Pacing Pulse Width: 0.4 ms
Lead Channel Setting Sensing Sensitivity: 0.3 mV

## 2019-01-14 ENCOUNTER — Encounter: Payer: Self-pay | Admitting: Cardiology

## 2019-01-14 NOTE — Progress Notes (Signed)
Remote ICD transmission.   

## 2019-02-25 DIAGNOSIS — E1169 Type 2 diabetes mellitus with other specified complication: Secondary | ICD-10-CM | POA: Diagnosis not present

## 2019-02-25 DIAGNOSIS — N390 Urinary tract infection, site not specified: Secondary | ICD-10-CM | POA: Diagnosis not present

## 2019-02-25 DIAGNOSIS — I5189 Other ill-defined heart diseases: Secondary | ICD-10-CM | POA: Diagnosis not present

## 2019-02-25 DIAGNOSIS — N399 Disorder of urinary system, unspecified: Secondary | ICD-10-CM | POA: Diagnosis not present

## 2019-02-25 DIAGNOSIS — I10 Essential (primary) hypertension: Secondary | ICD-10-CM | POA: Diagnosis not present

## 2019-02-25 DIAGNOSIS — I639 Cerebral infarction, unspecified: Secondary | ICD-10-CM | POA: Diagnosis not present

## 2019-03-10 DIAGNOSIS — I10 Essential (primary) hypertension: Secondary | ICD-10-CM | POA: Diagnosis not present

## 2019-03-10 DIAGNOSIS — E1169 Type 2 diabetes mellitus with other specified complication: Secondary | ICD-10-CM | POA: Diagnosis not present

## 2019-03-10 DIAGNOSIS — N390 Urinary tract infection, site not specified: Secondary | ICD-10-CM | POA: Diagnosis not present

## 2019-03-10 DIAGNOSIS — I639 Cerebral infarction, unspecified: Secondary | ICD-10-CM | POA: Diagnosis not present

## 2019-04-06 ENCOUNTER — Ambulatory Visit (INDEPENDENT_AMBULATORY_CARE_PROVIDER_SITE_OTHER): Payer: Medicare Other | Admitting: *Deleted

## 2019-04-06 ENCOUNTER — Telehealth: Payer: Self-pay | Admitting: Internal Medicine

## 2019-04-06 DIAGNOSIS — I5022 Chronic systolic (congestive) heart failure: Secondary | ICD-10-CM

## 2019-04-06 DIAGNOSIS — I255 Ischemic cardiomyopathy: Secondary | ICD-10-CM

## 2019-04-06 LAB — CUP PACEART REMOTE DEVICE CHECK
Battery Remaining Longevity: 14 mo
Battery Voltage: 2.88 V
Brady Statistic AP VP Percent: 0 %
Brady Statistic AP VS Percent: 0 %
Brady Statistic AS VP Percent: 0.03 %
Brady Statistic AS VS Percent: 99.97 %
Brady Statistic RA Percent Paced: 0 %
Brady Statistic RV Percent Paced: 0.04 %
Date Time Interrogation Session: 20201111045640
HighPow Impedance: 69 Ohm
Implantable Lead Implant Date: 20150107
Implantable Lead Implant Date: 20150107
Implantable Lead Implant Date: 20150107
Implantable Lead Location: 753857
Implantable Lead Location: 753859
Implantable Lead Location: 753860
Implantable Lead Model: 4298
Implantable Lead Model: 5076
Implantable Lead Model: 6935
Implantable Pulse Generator Implant Date: 20150107
Lead Channel Impedance Value: 323 Ohm
Lead Channel Impedance Value: 342 Ohm
Lead Channel Impedance Value: 342 Ohm
Lead Channel Impedance Value: 380 Ohm
Lead Channel Impedance Value: 380 Ohm
Lead Channel Impedance Value: 399 Ohm
Lead Channel Impedance Value: 456 Ohm
Lead Channel Impedance Value: 456 Ohm
Lead Channel Impedance Value: 570 Ohm
Lead Channel Impedance Value: 589 Ohm
Lead Channel Impedance Value: 627 Ohm
Lead Channel Impedance Value: 646 Ohm
Lead Channel Impedance Value: 665 Ohm
Lead Channel Pacing Threshold Amplitude: 0.75 V
Lead Channel Pacing Threshold Amplitude: 0.875 V
Lead Channel Pacing Threshold Amplitude: 5.5 V
Lead Channel Pacing Threshold Pulse Width: 0.4 ms
Lead Channel Pacing Threshold Pulse Width: 0.4 ms
Lead Channel Pacing Threshold Pulse Width: 0.8 ms
Lead Channel Sensing Intrinsic Amplitude: 2.375 mV
Lead Channel Sensing Intrinsic Amplitude: 2.375 mV
Lead Channel Sensing Intrinsic Amplitude: 26.75 mV
Lead Channel Sensing Intrinsic Amplitude: 26.75 mV
Lead Channel Setting Pacing Amplitude: 2 V
Lead Channel Setting Pacing Pulse Width: 0.4 ms
Lead Channel Setting Sensing Sensitivity: 0.3 mV

## 2019-04-06 NOTE — Telephone Encounter (Signed)
New message   Patient is calling to see if device has transmitted. Please advise.

## 2019-04-07 NOTE — Telephone Encounter (Signed)
Transmission received. Pt notified.  Chanetta Marshall, NP 04/07/2019 8:18 AM

## 2019-04-09 ENCOUNTER — Other Ambulatory Visit: Payer: Self-pay | Admitting: Internal Medicine

## 2019-04-28 NOTE — Progress Notes (Signed)
Remote ICD transmission.   

## 2019-06-02 ENCOUNTER — Other Ambulatory Visit: Payer: Self-pay | Admitting: Internal Medicine

## 2019-06-10 DIAGNOSIS — I639 Cerebral infarction, unspecified: Secondary | ICD-10-CM | POA: Diagnosis not present

## 2019-06-10 DIAGNOSIS — I10 Essential (primary) hypertension: Secondary | ICD-10-CM | POA: Diagnosis not present

## 2019-06-10 DIAGNOSIS — E1169 Type 2 diabetes mellitus with other specified complication: Secondary | ICD-10-CM | POA: Diagnosis not present

## 2019-06-10 DIAGNOSIS — I509 Heart failure, unspecified: Secondary | ICD-10-CM | POA: Diagnosis not present

## 2019-06-16 ENCOUNTER — Other Ambulatory Visit: Payer: Self-pay

## 2019-06-16 ENCOUNTER — Ambulatory Visit (INDEPENDENT_AMBULATORY_CARE_PROVIDER_SITE_OTHER): Payer: Medicare Other | Admitting: Internal Medicine

## 2019-06-16 ENCOUNTER — Encounter: Payer: Self-pay | Admitting: Internal Medicine

## 2019-06-16 VITALS — BP 144/90 | HR 118 | Ht 64.0 in | Wt 125.2 lb

## 2019-06-16 DIAGNOSIS — I447 Left bundle-branch block, unspecified: Secondary | ICD-10-CM | POA: Diagnosis not present

## 2019-06-16 DIAGNOSIS — Z95810 Presence of automatic (implantable) cardiac defibrillator: Secondary | ICD-10-CM

## 2019-06-16 DIAGNOSIS — I5022 Chronic systolic (congestive) heart failure: Secondary | ICD-10-CM | POA: Diagnosis not present

## 2019-06-16 MED ORDER — METOPROLOL TARTRATE 50 MG PO TABS: 50.0000 mg | ORAL_TABLET | Freq: Two times a day (BID) | ORAL | 3 refills | Status: DC

## 2019-06-16 NOTE — Progress Notes (Signed)
HPI [name] returns today for followup. She is a pleasant 74yo woman with anICM, chronic systolic heart failure, LBBB, s/p BiV ICD. In the interim, she has done well.  No chest pain. No ICD shock. She was noted to have an increase in her LV pacing threshold and this resulted in diaphragmatic stimulation. She has become even more sedentary due to instability when she walks. No chest pain or sob.She admits to not taking her meds this morning.   No Known Allergies   Current Outpatient Medications  Medication Sig Dispense Refill  . aspirin EC 81 MG tablet Take 81 mg by mouth daily.    . hydrochlorothiazide (MICROZIDE) 12.5 MG capsule TAKE 1 CAPSULE BY MOUTH EVERY DAY 90 capsule 0  . lisinopril (ZESTRIL) 5 MG tablet Take 1 tablet (5 mg total) by mouth daily. Please make yearly appt with Dr. Lovena Le for January before anymore refills. 1st attempt 90 tablet 0  . metoprolol tartrate (LOPRESSOR) 50 MG tablet TAKE 1/2 TABLET BY MOUTH TWICE A DAY 90 tablet 3  . rosuvastatin (CRESTOR) 20 MG tablet Take 20 mg by mouth daily.    . sacubitril-valsartan (ENTRESTO) 24-26 MG Take 1 tablet by mouth daily.    . vitamin E 200 UNIT capsule Take 200 Units by mouth daily.     No current facility-administered medications for this visit.     Past Medical History:  Diagnosis Date  . Coronary artery disease   . Exertional shortness of breath   . Hypertension   . Ischemic cardiomyopathy   . LBBB (left bundle branch block)   . Myocardial infarction ([place]) 1990's;01/2013   ; "mild, right before OHS"  . S/P CABG x 2 - LIMA-LAD, SVG-OM, 01/27/13 01/28/2013  . Smoker   . Thoracic aneurysm without mention of rupture     ROS:   All systems reviewed and negative except as noted in the HPI.   Past Surgical History:  Procedure Laterality Date  . ABDOMINAL HYSTERECTOMY    . BI-VENTRICULAR IMPLANTABLE CARDIOVERTER DEFIBRILLATOR N/A 06/01/2013   Procedure: BI-VENTRICULAR IMPLANTABLE CARDIOVERTER  DEFIBRILLATOR  (CRT-D);  Surgeon: Evans Lance, MD;  Location: [facility] CATH LAB;  Service: Cardiovascular;  Laterality: N/A;  . COLONOSCOPY WITH PROPOFOL N/A 10/07/2016   Procedure: COLONOSCOPY WITH PROPOFOL;  Surgeon: Manus Gunning, MD;  Location: [facility] ENDOSCOPY;  Service: Gastroenterology;  Laterality: N/A;  . CORONARY ARTERY BYPASS GRAFT N/A 01/27/2013   Procedure: CORONARY ARTERY BYPASS GRAFTING times two using Right Greater Saphenous Vein Graft Harvsted Endoscopically and Left Internal Mammary Artery;  Surgeon: Gaye Pollack, MD;  Location: [place] OR;  Service: Open Heart Surgery;  Laterality: N/A;  . DILATION AND CURETTAGE OF UTERUS    . FRACTURE SURGERY    . INTRAOPERATIVE TRANSESOPHAGEAL ECHOCARDIOGRAM N/A 01/27/2013   Procedure: INTRAOPERATIVE TRANSESOPHAGEAL ECHOCARDIOGRAM;  Surgeon: Gaye Pollack, MD;  Location: [facility] OR;  Service: Open Heart Surgery;  Laterality: N/A;  . LEFT HEART CATHETERIZATION WITH CORONARY ANGIOGRAM N/A 01/20/2013   Procedure: LEFT HEART CATHETERIZATION WITH CORONARY ANGIOGRAM;  Surgeon: Leonie Man, MD;  Location: [facility] CATH LAB;  Service: Cardiovascular;  Laterality: N/A;  . RIGHT HEART CATHETERIZATION  01/20/2013   Procedure: RIGHT HEART CATH;  Surgeon: Leonie Man, MD;  Location: [facility] CATH LAB;  Service: Cardiovascular;;  . TUBAL LIGATION       Family History  Problem Relation Age of Onset  . Hypertension Mother   . Heart disease Father   . Throat cancer Brother   .  Brain cancer Brother   . Colon cancer Neg Hx   . Stomach cancer Neg Hx   . Rectal cancer Neg Hx   . Liver cancer Neg Hx      Social History   Socioeconomic History  . Marital status: Divorced    Spouse name: Not on file  . Number of children: 3  . Years of education: Not on file  . Highest education level: Not on file  Occupational History  . Occupation: retired  Tobacco Use  . Smoking status: Current Some Day Smoker    Packs/day: 0.12    Years: 50.00    Pack years: 6.00     Types: Cigarettes  . Smokeless tobacco: Never Used  Substance and Sexual Activity  . Alcohol use: Yes    Comment: 06/01/2013 "couple times/yr I'll have 1-2 drinks"  . Drug use: No  . Sexual activity: Not Currently    Birth control/protection: Surgical  Other Topics Concern  . Not on file  Social History Narrative  . Not on file   Social Determinants of Health   Financial Resource Strain:   . Difficulty of Paying Living Expenses: Not on file  Food Insecurity:   . Worried About Charity fundraiser in the Last Year: Not on file  . Ran Out of Food in the Last Year: Not on file  Transportation Needs:   . Lack of Transportation (Medical): Not on file  . Lack of Transportation (Non-Medical): Not on file  Physical Activity:   . Days of Exercise per Week: Not on file  . Minutes of Exercise per Session: Not on file  Stress:   . Feeling of Stress : Not on file  Social Connections:   . Frequency of Communication with Friends and Family: Not on file  . Frequency of Social Gatherings with Friends and Family: Not on file  . Attends Religious Services: Not on file  . Active Member of Clubs or Organizations: Not on file  . Attends Archivist Meetings: Not on file  . Marital Status: Not on file  Intimate Partner Violence:   . Fear of Current or Ex-Partner: Not on file  . Emotionally Abused: Not on file  . Physically Abused: Not on file  . Sexually Abused: Not on file     BP (!) 144/90   Pulse (!) 118   Ht 5\' 4"  (1.626 m)   Wt 125 lb 3.2 oz (56.8 kg)   SpO2 96%   BMI 21.49 kg/m   Physical Exam:  Well appearing NAD HEENT: Unremarkable Neck:  No JVD, no thyromegally Lymphatics:  No adenopathy Back:  No CVA tenderness Lungs:  Clear with basilar rales. HEART:  Regular tachy rhythm, no murmurs, no rubs, no clicks Abd:  soft, positive bowel sounds, no organomegally, no rebound, no guarding Ext:  2 plus pulses, no edema, no cyanosis, no clubbing Skin:  No rashes no  nodules Neuro:  CN II through XII intact, motor grossly intact  EKG -sinus tachycardia with LBBB  DEVICE  Normal device function.  See PaceArt for details.   Assess/Plan: 1. Chronic systolic heart failure - her symptoms are class 2. I asked her to take her meds and increase her beta blocker.  2. ICD - her medtronic device has the LV lead turned off due to diaghragmatic stimulation. I would anticipate placing a left bundle lead when she reaches ERI.  3. Dyslipidemia - she will continue her statin therapy. 4. HTN - her blood pressure is  up and will increase her beta blocker .  Mikle Bosworth.D.

## 2019-06-16 NOTE — Patient Instructions (Addendum)
Medication Instructions:  Your physician has recommended you make the following change in your medication:   1.  Increase your metoprolol tartrate 50 mg--Take one tablet by mouth twice a day  Labwork: None ordered.  Testing/Procedures: None ordered.  Follow-Up: Your physician wants you to follow-up in: October 2021 with Dr. Lovena Le.   You will receive a reminder letter in the mail two months in advance. If you don't receive a letter, please call our office to schedule the follow-up appointment.  Remote monitoring is used to monitor your ICD from home. This monitoring reduces the number of office visits required to check your device to one time per year. It allows [place] to keep an eye on the functioning of your device to ensure it is working properly. You are scheduled for a device check from home on 07/06/2019. You may send your transmission at any time that day. If you have a wireless device, the transmission will be sent automatically. After your physician reviews your transmission, you will receive a postcard with your next transmission date.  Any Other Special Instructions Will Be Listed Below (If Applicable).  If you need a refill on your cardiac medications before your next appointment, please call your pharmacy.

## 2019-06-17 NOTE — Addendum Note (Signed)
Addended by: Rose Phi on: 06/17/2019 04:52 PM   Modules accepted: Orders

## 2019-07-06 ENCOUNTER — Ambulatory Visit (INDEPENDENT_AMBULATORY_CARE_PROVIDER_SITE_OTHER): Payer: Medicare Other | Admitting: *Deleted

## 2019-07-06 DIAGNOSIS — I5022 Chronic systolic (congestive) heart failure: Secondary | ICD-10-CM | POA: Diagnosis not present

## 2019-07-06 LAB — CUP PACEART REMOTE DEVICE CHECK
Battery Remaining Longevity: 10 mo
Battery Voltage: 2.86 V
Brady Statistic AP VP Percent: 0 %
Brady Statistic AP VS Percent: 0 %
Brady Statistic AS VP Percent: 0.14 %
Brady Statistic AS VS Percent: 99.86 %
Brady Statistic RA Percent Paced: 0 %
Brady Statistic RV Percent Paced: 0.16 %
Date Time Interrogation Session: 20210210023326
HighPow Impedance: 68 Ohm
Implantable Lead Implant Date: 20150107
Implantable Lead Implant Date: 20150107
Implantable Lead Implant Date: 20150107
Implantable Lead Location: 753857
Implantable Lead Location: 753859
Implantable Lead Location: 753860
Implantable Lead Model: 4298
Implantable Lead Model: 5076
Implantable Lead Model: 6935
Implantable Pulse Generator Implant Date: 20150107
Lead Channel Impedance Value: 323 Ohm
Lead Channel Impedance Value: 323 Ohm
Lead Channel Impedance Value: 342 Ohm
Lead Channel Impedance Value: 342 Ohm
Lead Channel Impedance Value: 342 Ohm
Lead Channel Impedance Value: 399 Ohm
Lead Channel Impedance Value: 437 Ohm
Lead Channel Impedance Value: 494 Ohm
Lead Channel Impedance Value: 570 Ohm
Lead Channel Impedance Value: 570 Ohm
Lead Channel Impedance Value: 589 Ohm
Lead Channel Impedance Value: 627 Ohm
Lead Channel Impedance Value: 646 Ohm
Lead Channel Pacing Threshold Amplitude: 0.75 V
Lead Channel Pacing Threshold Amplitude: 0.75 V
Lead Channel Pacing Threshold Amplitude: 5.5 V
Lead Channel Pacing Threshold Pulse Width: 0.4 ms
Lead Channel Pacing Threshold Pulse Width: 0.4 ms
Lead Channel Pacing Threshold Pulse Width: 0.8 ms
Lead Channel Sensing Intrinsic Amplitude: 1.75 mV
Lead Channel Sensing Intrinsic Amplitude: 1.75 mV
Lead Channel Sensing Intrinsic Amplitude: 31.625 mV
Lead Channel Sensing Intrinsic Amplitude: 31.625 mV
Lead Channel Setting Pacing Amplitude: 2 V
Lead Channel Setting Pacing Pulse Width: 0.4 ms
Lead Channel Setting Sensing Sensitivity: 0.3 mV

## 2019-07-07 NOTE — Progress Notes (Signed)
ICD Remote  

## 2019-07-08 ENCOUNTER — Other Ambulatory Visit: Payer: Self-pay | Admitting: Internal Medicine

## 2019-07-24 DIAGNOSIS — I10 Essential (primary) hypertension: Secondary | ICD-10-CM | POA: Diagnosis not present

## 2019-07-24 DIAGNOSIS — E7849 Other hyperlipidemia: Secondary | ICD-10-CM | POA: Diagnosis not present

## 2019-09-10 ENCOUNTER — Other Ambulatory Visit: Payer: Self-pay | Admitting: Internal Medicine

## 2019-10-05 ENCOUNTER — Ambulatory Visit (INDEPENDENT_AMBULATORY_CARE_PROVIDER_SITE_OTHER): Payer: Medicare Other | Admitting: *Deleted

## 2019-10-05 DIAGNOSIS — I255 Ischemic cardiomyopathy: Secondary | ICD-10-CM

## 2019-10-05 DIAGNOSIS — I5022 Chronic systolic (congestive) heart failure: Secondary | ICD-10-CM | POA: Diagnosis not present

## 2019-10-05 LAB — CUP PACEART REMOTE DEVICE CHECK
Battery Remaining Longevity: 8 mo
Battery Voltage: 2.84 V
Brady Statistic AP VP Percent: 0 %
Brady Statistic AP VS Percent: 0 %
Brady Statistic AS VP Percent: 0.14 %
Brady Statistic AS VS Percent: 99.86 %
Brady Statistic RA Percent Paced: 0 %
Brady Statistic RV Percent Paced: 0.17 %
Date Time Interrogation Session: 20210512102956
HighPow Impedance: 62 Ohm
Implantable Lead Implant Date: 20150107
Implantable Lead Implant Date: 20150107
Implantable Lead Implant Date: 20150107
Implantable Lead Location: 753857
Implantable Lead Location: 753859
Implantable Lead Location: 753860
Implantable Lead Model: 4298
Implantable Lead Model: 5076
Implantable Lead Model: 6935
Implantable Pulse Generator Implant Date: 20150107
Lead Channel Impedance Value: 304 Ohm
Lead Channel Impedance Value: 323 Ohm
Lead Channel Impedance Value: 342 Ohm
Lead Channel Impedance Value: 342 Ohm
Lead Channel Impedance Value: 342 Ohm
Lead Channel Impedance Value: 380 Ohm
Lead Channel Impedance Value: 456 Ohm
Lead Channel Impedance Value: 494 Ohm
Lead Channel Impedance Value: 532 Ohm
Lead Channel Impedance Value: 570 Ohm
Lead Channel Impedance Value: 589 Ohm
Lead Channel Impedance Value: 627 Ohm
Lead Channel Impedance Value: 646 Ohm
Lead Channel Pacing Threshold Amplitude: 0.75 V
Lead Channel Pacing Threshold Amplitude: 0.875 V
Lead Channel Pacing Threshold Amplitude: 5.5 V
Lead Channel Pacing Threshold Pulse Width: 0.4 ms
Lead Channel Pacing Threshold Pulse Width: 0.4 ms
Lead Channel Pacing Threshold Pulse Width: 0.8 ms
Lead Channel Sensing Intrinsic Amplitude: 2.625 mV
Lead Channel Sensing Intrinsic Amplitude: 2.625 mV
Lead Channel Sensing Intrinsic Amplitude: 28.5 mV
Lead Channel Sensing Intrinsic Amplitude: 28.5 mV
Lead Channel Setting Pacing Amplitude: 2 V
Lead Channel Setting Pacing Pulse Width: 0.4 ms
Lead Channel Setting Sensing Sensitivity: 0.3 mV

## 2019-10-06 NOTE — Progress Notes (Signed)
Remote ICD transmission.   

## 2020-01-04 ENCOUNTER — Ambulatory Visit (INDEPENDENT_AMBULATORY_CARE_PROVIDER_SITE_OTHER): Payer: Medicare Other | Admitting: *Deleted

## 2020-01-04 DIAGNOSIS — I5022 Chronic systolic (congestive) heart failure: Secondary | ICD-10-CM | POA: Diagnosis not present

## 2020-01-04 LAB — CUP PACEART REMOTE DEVICE CHECK
Battery Remaining Longevity: 6 mo
Battery Voltage: 2.83 V
Brady Statistic AP VP Percent: 0 %
Brady Statistic AP VS Percent: 0 %
Brady Statistic AS VP Percent: 0.11 %
Brady Statistic AS VS Percent: 99.89 %
Brady Statistic RA Percent Paced: 0 %
Brady Statistic RV Percent Paced: 0.13 %
Date Time Interrogation Session: 20210811052704
HighPow Impedance: 63 Ohm
Implantable Lead Implant Date: 20150107
Implantable Lead Implant Date: 20150107
Implantable Lead Implant Date: 20150107
Implantable Lead Location: 753857
Implantable Lead Location: 753859
Implantable Lead Location: 753860
Implantable Lead Model: 4298
Implantable Lead Model: 5076
Implantable Lead Model: 6935
Implantable Pulse Generator Implant Date: 20150107
Lead Channel Impedance Value: 323 Ohm
Lead Channel Impedance Value: 342 Ohm
Lead Channel Impedance Value: 342 Ohm
Lead Channel Impedance Value: 342 Ohm
Lead Channel Impedance Value: 380 Ohm
Lead Channel Impedance Value: 437 Ohm
Lead Channel Impedance Value: 456 Ohm
Lead Channel Impedance Value: 456 Ohm
Lead Channel Impedance Value: 532 Ohm
Lead Channel Impedance Value: 570 Ohm
Lead Channel Impedance Value: 646 Ohm
Lead Channel Impedance Value: 665 Ohm
Lead Channel Impedance Value: 703 Ohm
Lead Channel Pacing Threshold Amplitude: 0.75 V
Lead Channel Pacing Threshold Amplitude: 0.75 V
Lead Channel Pacing Threshold Amplitude: 5.5 V
Lead Channel Pacing Threshold Pulse Width: 0.4 ms
Lead Channel Pacing Threshold Pulse Width: 0.4 ms
Lead Channel Pacing Threshold Pulse Width: 0.8 ms
Lead Channel Sensing Intrinsic Amplitude: 1.75 mV
Lead Channel Sensing Intrinsic Amplitude: 1.75 mV
Lead Channel Sensing Intrinsic Amplitude: 30.5 mV
Lead Channel Sensing Intrinsic Amplitude: 30.5 mV
Lead Channel Setting Pacing Amplitude: 2 V
Lead Channel Setting Pacing Pulse Width: 0.4 ms
Lead Channel Setting Sensing Sensitivity: 0.3 mV

## 2020-01-05 ENCOUNTER — Telehealth: Payer: Self-pay | Admitting: *Deleted

## 2020-01-05 NOTE — Telephone Encounter (Signed)
LMOM (DPR) advising that ICD transmission from 8/11 shows estimated remaining battery longevity is 6 months. Advised of need for monthly battery checks, next on 02/06/20. Requested that pt call back to confirm she received message, and to ensure she doesn't have questions. Direct number and office hours provided.

## 2020-01-09 NOTE — Progress Notes (Signed)
Remote ICD transmission.   

## 2020-01-09 NOTE — Telephone Encounter (Signed)
Called patient in regards to monthly battery checks. Unable to leave VM. States "unable to take message at this time."

## 2020-01-16 NOTE — Telephone Encounter (Signed)
LMOM (DPR) that she has been scheduled for monthly battery checks due to 6 months to ERI. Requested she call office to confirm that she got message. DC # and office hours provided.

## 2020-01-17 DIAGNOSIS — E785 Hyperlipidemia, unspecified: Secondary | ICD-10-CM | POA: Diagnosis not present

## 2020-01-17 DIAGNOSIS — Z0000 Encounter for general adult medical examination without abnormal findings: Secondary | ICD-10-CM | POA: Diagnosis not present

## 2020-01-17 DIAGNOSIS — N390 Urinary tract infection, site not specified: Secondary | ICD-10-CM | POA: Diagnosis not present

## 2020-01-17 DIAGNOSIS — E118 Type 2 diabetes mellitus with unspecified complications: Secondary | ICD-10-CM | POA: Diagnosis not present

## 2020-01-17 DIAGNOSIS — R600 Localized edema: Secondary | ICD-10-CM | POA: Diagnosis not present

## 2020-01-17 DIAGNOSIS — E1169 Type 2 diabetes mellitus with other specified complication: Secondary | ICD-10-CM | POA: Diagnosis not present

## 2020-01-17 DIAGNOSIS — I10 Essential (primary) hypertension: Secondary | ICD-10-CM | POA: Diagnosis not present

## 2020-01-18 NOTE — Telephone Encounter (Signed)
Fourth attempt:  LMOVM (DPR) reiterating purpose of monthly battery checks. Pt has upcoming appointment with Dr. Lovena Le on 03/20/20. Carelink monitor is transmitting automatically. Will plan to discuss in more detail at upcoming [place]. Direct number and office hours provided for any questions in the interim.

## 2020-01-24 DIAGNOSIS — I10 Essential (primary) hypertension: Secondary | ICD-10-CM | POA: Diagnosis not present

## 2020-01-24 DIAGNOSIS — E7849 Other hyperlipidemia: Secondary | ICD-10-CM | POA: Diagnosis not present

## 2020-02-06 ENCOUNTER — Ambulatory Visit (INDEPENDENT_AMBULATORY_CARE_PROVIDER_SITE_OTHER): Payer: Medicare Other | Admitting: *Deleted

## 2020-02-06 DIAGNOSIS — I255 Ischemic cardiomyopathy: Secondary | ICD-10-CM

## 2020-02-07 DIAGNOSIS — N390 Urinary tract infection, site not specified: Secondary | ICD-10-CM | POA: Diagnosis not present

## 2020-02-08 LAB — CUP PACEART REMOTE DEVICE CHECK
Battery Remaining Longevity: 5 mo
Battery Voltage: 2.83 V
Brady Statistic AP VP Percent: 0 %
Brady Statistic AP VS Percent: 0 %
Brady Statistic AS VP Percent: 0.21 %
Brady Statistic AS VS Percent: 99.79 %
Brady Statistic RA Percent Paced: 0 %
Brady Statistic RV Percent Paced: 0.24 %
Date Time Interrogation Session: 20210913022704
HighPow Impedance: 59 Ohm
Implantable Lead Implant Date: 20150107
Implantable Lead Implant Date: 20150107
Implantable Lead Implant Date: 20150107
Implantable Lead Location: 753857
Implantable Lead Location: 753859
Implantable Lead Location: 753860
Implantable Lead Model: 4298
Implantable Lead Model: 5076
Implantable Lead Model: 6935
Implantable Pulse Generator Implant Date: 20150107
Lead Channel Impedance Value: 266 Ohm
Lead Channel Impedance Value: 304 Ohm
Lead Channel Impedance Value: 304 Ohm
Lead Channel Impedance Value: 323 Ohm
Lead Channel Impedance Value: 323 Ohm
Lead Channel Impedance Value: 342 Ohm
Lead Channel Impedance Value: 399 Ohm
Lead Channel Impedance Value: 437 Ohm
Lead Channel Impedance Value: 494 Ohm
Lead Channel Impedance Value: 494 Ohm
Lead Channel Impedance Value: 532 Ohm
Lead Channel Impedance Value: 532 Ohm
Lead Channel Impedance Value: 570 Ohm
Lead Channel Pacing Threshold Amplitude: 0.75 V
Lead Channel Pacing Threshold Amplitude: 0.875 V
Lead Channel Pacing Threshold Amplitude: 5.5 V
Lead Channel Pacing Threshold Pulse Width: 0.4 ms
Lead Channel Pacing Threshold Pulse Width: 0.4 ms
Lead Channel Pacing Threshold Pulse Width: 0.8 ms
Lead Channel Sensing Intrinsic Amplitude: 2.875 mV
Lead Channel Sensing Intrinsic Amplitude: 2.875 mV
Lead Channel Sensing Intrinsic Amplitude: 24.5 mV
Lead Channel Sensing Intrinsic Amplitude: 24.5 mV
Lead Channel Setting Pacing Amplitude: 2 V
Lead Channel Setting Pacing Pulse Width: 0.4 ms
Lead Channel Setting Sensing Sensitivity: 0.3 mV

## 2020-02-08 NOTE — Addendum Note (Signed)
Addended by: Cheri Kearns A on: 02/08/2020 12:27 PM   Modules accepted: Level of Service

## 2020-02-08 NOTE — Progress Notes (Signed)
Remote ICD transmission.   

## 2020-02-28 DIAGNOSIS — E1169 Type 2 diabetes mellitus with other specified complication: Secondary | ICD-10-CM | POA: Diagnosis not present

## 2020-02-28 DIAGNOSIS — F17200 Nicotine dependence, unspecified, uncomplicated: Secondary | ICD-10-CM | POA: Diagnosis not present

## 2020-02-28 DIAGNOSIS — N390 Urinary tract infection, site not specified: Secondary | ICD-10-CM | POA: Diagnosis not present

## 2020-02-28 DIAGNOSIS — I10 Essential (primary) hypertension: Secondary | ICD-10-CM | POA: Diagnosis not present

## 2020-02-28 DIAGNOSIS — N3281 Overactive bladder: Secondary | ICD-10-CM | POA: Diagnosis not present

## 2020-03-08 ENCOUNTER — Ambulatory Visit (INDEPENDENT_AMBULATORY_CARE_PROVIDER_SITE_OTHER): Payer: Medicare Other

## 2020-03-08 DIAGNOSIS — I255 Ischemic cardiomyopathy: Secondary | ICD-10-CM

## 2020-03-08 LAB — CUP PACEART REMOTE DEVICE CHECK
Battery Remaining Longevity: 4 mo
Battery Voltage: 2.82 V
Brady Statistic AP VP Percent: 0 %
Brady Statistic AP VS Percent: 0 %
Brady Statistic AS VP Percent: 0.21 %
Brady Statistic AS VS Percent: 99.79 %
Brady Statistic RA Percent Paced: 0 %
Brady Statistic RV Percent Paced: 0.24 %
Date Time Interrogation Session: 20211014022724
HighPow Impedance: 71 Ohm
Implantable Lead Implant Date: 20150107
Implantable Lead Implant Date: 20150107
Implantable Lead Implant Date: 20150107
Implantable Lead Location: 753857
Implantable Lead Location: 753859
Implantable Lead Location: 753860
Implantable Lead Model: 4298
Implantable Lead Model: 5076
Implantable Lead Model: 6935
Implantable Pulse Generator Implant Date: 20150107
Lead Channel Impedance Value: 323 Ohm
Lead Channel Impedance Value: 323 Ohm
Lead Channel Impedance Value: 342 Ohm
Lead Channel Impedance Value: 380 Ohm
Lead Channel Impedance Value: 380 Ohm
Lead Channel Impedance Value: 380 Ohm
Lead Channel Impedance Value: 494 Ohm
Lead Channel Impedance Value: 494 Ohm
Lead Channel Impedance Value: 570 Ohm
Lead Channel Impedance Value: 570 Ohm
Lead Channel Impedance Value: 589 Ohm
Lead Channel Impedance Value: 627 Ohm
Lead Channel Impedance Value: 627 Ohm
Lead Channel Pacing Threshold Amplitude: 0.75 V
Lead Channel Pacing Threshold Amplitude: 0.75 V
Lead Channel Pacing Threshold Amplitude: 5.5 V
Lead Channel Pacing Threshold Pulse Width: 0.4 ms
Lead Channel Pacing Threshold Pulse Width: 0.4 ms
Lead Channel Pacing Threshold Pulse Width: 0.8 ms
Lead Channel Sensing Intrinsic Amplitude: 2.25 mV
Lead Channel Sensing Intrinsic Amplitude: 2.25 mV
Lead Channel Sensing Intrinsic Amplitude: 28.375 mV
Lead Channel Sensing Intrinsic Amplitude: 28.375 mV
Lead Channel Setting Pacing Amplitude: 2 V
Lead Channel Setting Pacing Pulse Width: 0.4 ms
Lead Channel Setting Sensing Sensitivity: 0.3 mV

## 2020-03-13 NOTE — Progress Notes (Signed)
Remote ICD transmission.   

## 2020-03-20 ENCOUNTER — Other Ambulatory Visit: Payer: Self-pay

## 2020-03-20 ENCOUNTER — Ambulatory Visit (INDEPENDENT_AMBULATORY_CARE_PROVIDER_SITE_OTHER): Payer: Medicare Other | Admitting: Internal Medicine

## 2020-03-20 ENCOUNTER — Encounter: Payer: Self-pay | Admitting: Internal Medicine

## 2020-03-20 VITALS — BP 134/80 | HR 67 | Ht 64.0 in | Wt 130.0 lb

## 2020-03-20 DIAGNOSIS — I447 Left bundle-branch block, unspecified: Secondary | ICD-10-CM | POA: Diagnosis not present

## 2020-03-20 DIAGNOSIS — Z95810 Presence of automatic (implantable) cardiac defibrillator: Secondary | ICD-10-CM | POA: Diagnosis not present

## 2020-03-20 DIAGNOSIS — I5022 Chronic systolic (congestive) heart failure: Secondary | ICD-10-CM | POA: Diagnosis not present

## 2020-03-20 NOTE — Progress Notes (Signed)
HPI Kathryn Dixon returns today for followup. She is a pleasant 74yo woman with anICM, chronic systolic heart failure, LBBB, s/p BiV ICD. In the interim, she has done well.  No chest pain. No ICD shock. She was noted to have an increase in her LV pacing threshold and this resulted in diaphragmatic stimulation. Her LV was turned off. She has cut back on her smoking but still has not stopped. No chest pain or sob. No Known Allergies   Current Outpatient Medications  Medication Sig Dispense Refill  . lisinopril (ZESTRIL) 5 MG tablet TAKE 1 TABLET BY MOUTH EVERY DAY. ANNUAL APPT WITH DISCARD REMAINDER. Lilah Mijangos DUE FOR JANUARY BEFORE ANY MORE REFILLS.(1ST ATTEMPT) 90 tablet 3  . aspirin EC 81 MG tablet Take 81 mg by mouth daily.    . hydrochlorothiazide (MICROZIDE) 12.5 MG capsule TAKE 1 CAPSULE BY MOUTH EVERY DAY 90 capsule 2  . metoprolol tartrate (LOPRESSOR) 50 MG tablet Take 1 tablet (50 mg total) by mouth 2 (two) times daily. 180 tablet 3  . rosuvastatin (CRESTOR) 20 MG tablet Take 20 mg by mouth daily.    . sacubitril-valsartan (ENTRESTO) 24-26 MG Take 1 tablet by mouth daily.    . vitamin E 200 UNIT capsule Take 200 Units by mouth daily.     No current facility-administered medications for this visit.     Past Medical History:  Diagnosis Date  . Coronary artery disease   . Exertional shortness of breath   . Hypertension   . Ischemic cardiomyopathy   . LBBB (left bundle branch block)   . Myocardial infarction ([place]) 1990's;01/2013   ; "mild, right before OHS"  . S/P CABG x 2 - LIMA-LAD, SVG-OM, 01/27/13 01/28/2013  . Smoker   . Thoracic aneurysm without mention of rupture     ROS:   All systems reviewed and negative except as noted in the HPI.   Past Surgical History:  Procedure Laterality Date  . ABDOMINAL HYSTERECTOMY    . BI-VENTRICULAR IMPLANTABLE CARDIOVERTER DEFIBRILLATOR N/A 06/01/2013   Procedure: BI-VENTRICULAR IMPLANTABLE CARDIOVERTER DEFIBRILLATOR  (CRT-D);   Surgeon: Evans Lance, MD;  Location: [facility] CATH LAB;  Service: Cardiovascular;  Laterality: N/A;  . COLONOSCOPY WITH PROPOFOL N/A 10/07/2016   Procedure: COLONOSCOPY WITH PROPOFOL;  Surgeon: Manus Gunning, MD;  Location: [facility] ENDOSCOPY;  Service: Gastroenterology;  Laterality: N/A;  . CORONARY ARTERY BYPASS GRAFT N/A 01/27/2013   Procedure: CORONARY ARTERY BYPASS GRAFTING times two using Right Greater Saphenous Vein Graft Harvsted Endoscopically and Left Internal Mammary Artery;  Surgeon: Gaye Pollack, MD;  Location: [place] OR;  Service: Open Heart Surgery;  Laterality: N/A;  . DILATION AND CURETTAGE OF UTERUS    . FRACTURE SURGERY    . INTRAOPERATIVE TRANSESOPHAGEAL ECHOCARDIOGRAM N/A 01/27/2013   Procedure: INTRAOPERATIVE TRANSESOPHAGEAL ECHOCARDIOGRAM;  Surgeon: Gaye Pollack, MD;  Location: [facility] OR;  Service: Open Heart Surgery;  Laterality: N/A;  . LEFT HEART CATHETERIZATION WITH CORONARY ANGIOGRAM N/A 01/20/2013   Procedure: LEFT HEART CATHETERIZATION WITH CORONARY ANGIOGRAM;  Surgeon: Leonie Man, MD;  Location: [facility] CATH LAB;  Service: Cardiovascular;  Laterality: N/A;  . RIGHT HEART CATHETERIZATION  01/20/2013   Procedure: RIGHT HEART CATH;  Surgeon: Leonie Man, MD;  Location: [facility] CATH LAB;  Service: Cardiovascular;;  . TUBAL LIGATION       Family History  Problem Relation Age of Onset  . Hypertension Mother   . Heart disease Father   . Throat cancer Brother   .  Brain cancer Brother   . Colon cancer Neg Hx   . Stomach cancer Neg Hx   . Rectal cancer Neg Hx   . Liver cancer Neg Hx      Social History   Socioeconomic History  . Marital status: Divorced    Spouse name: Not on file  . Number of children: 3  . Years of education: Not on file  . Highest education level: Not on file  Occupational History  . Occupation: retired  Tobacco Use  . Smoking status: Current Some Day Smoker    Packs/day: 0.12    Years: 50.00    Pack years: 6.00    Types: Cigarettes  .  Smokeless tobacco: Never Used  Vaping Use  . Vaping Use: Never used  Substance and Sexual Activity  . Alcohol use: Yes    Comment: 06/01/2013 "couple times/yr I'll have 1-2 drinks"  . Drug use: No  . Sexual activity: Not Currently    Birth control/protection: Surgical  Other Topics Concern  . Not on file  Social History Narrative  . Not on file   Social Determinants of Health   Financial Resource Strain:   . Difficulty of Paying Living Expenses: Not on file  Food Insecurity:   . Worried About Charity fundraiser in the Last Year: Not on file  . Ran Out of Food in the Last Year: Not on file  Transportation Needs:   . Lack of Transportation (Medical): Not on file  . Lack of Transportation (Non-Medical): Not on file  Physical Activity:   . Days of Exercise per Week: Not on file  . Minutes of Exercise per Session: Not on file  Stress:   . Feeling of Stress : Not on file  Social Connections:   . Frequency of Communication with Friends and Family: Not on file  . Frequency of Social Gatherings with Friends and Family: Not on file  . Attends Religious Services: Not on file  . Active Member of Clubs or Organizations: Not on file  . Attends Archivist Meetings: Not on file  . Marital Status: Not on file  Intimate Partner Violence:   . Fear of Current or Ex-Partner: Not on file  . Emotionally Abused: Not on file  . Physically Abused: Not on file  . Sexually Abused: Not on file     BP 134/80   Pulse 67   Ht 5\' 4"  (1.626 m)   Wt 130 lb (59 kg)   SpO2 93%   BMI 22.31 kg/m   Physical Exam:  Stable appearing 74 yo woman, NAD HEENT: Unremarkable Neck:  6 cm JVD, no thyromegally Lymphatics:  No adenopathy Back:  No CVA tenderness Lungs:  Clear with no wheezes HEART:  Regular rate rhythm, no murmurs, no rubs, no clicks Abd:  soft, positive bowel sounds, no organomegally, no rebound, no guarding Ext:  2 plus pulses, no edema, no cyanosis, no clubbing Skin:  No  rashes no nodules Neuro:  CN II through XII intact, motor grossly intact  EKG - nsr with LBBB  DEVICE  Normal device function.  See PaceArt for details.   Assess/Plan: 1. Chronic systolic heart failure - her symptoms are class 2. She will continue her current meds. 2. ICD - her medtronic device has been reprogrammed VVI. 3. LV lead dysfunction - her device outputs have been turned off due to elevated thresholds and diaphragmatic stimulation.   Carleene Overlie Lovada Barwick,MD

## 2020-03-20 NOTE — Patient Instructions (Addendum)
Medication Instructions:  Your physician recommends that you continue on your current medications as directed. Please refer to the Current Medication list given to you today.  Labwork: None ordered.  Testing/Procedures: None ordered.  Follow-Up: Your physician wants you to follow-up in: 8 months with Dr. Lovena Le.   You will receive a reminder letter in the mail two months in advance. If you don't receive a letter, please call our office to schedule the follow-up appointment.  Remote monitoring is used to monitor your ICD from home. This monitoring reduces the number of office visits required to check your device to one time per year. It allows [place] to keep an eye on the functioning of your device to ensure it is working properly. You are scheduled for a device check from home on 04/09/2020. You may send your transmission at any time that day. If you have a wireless device, the transmission will be sent automatically. After your physician reviews your transmission, you will receive a postcard with your next transmission date.  Any Other Special Instructions Will Be Listed Below (If Applicable).  If you need a refill on your cardiac medications before your next appointment, please call your pharmacy.

## 2020-03-22 DIAGNOSIS — N390 Urinary tract infection, site not specified: Secondary | ICD-10-CM | POA: Diagnosis not present

## 2020-03-22 DIAGNOSIS — E118 Type 2 diabetes mellitus with unspecified complications: Secondary | ICD-10-CM | POA: Diagnosis not present

## 2020-04-09 ENCOUNTER — Ambulatory Visit (INDEPENDENT_AMBULATORY_CARE_PROVIDER_SITE_OTHER): Payer: Medicare Other

## 2020-04-09 DIAGNOSIS — I255 Ischemic cardiomyopathy: Secondary | ICD-10-CM | POA: Diagnosis not present

## 2020-04-10 LAB — CUP PACEART REMOTE DEVICE CHECK
Battery Remaining Longevity: 3 mo
Battery Voltage: 2.81 V
Brady Statistic AP VP Percent: 0 %
Brady Statistic AP VS Percent: 0 %
Brady Statistic AS VP Percent: 0.36 %
Brady Statistic AS VS Percent: 99.64 %
Brady Statistic RA Percent Paced: 0 %
Brady Statistic RV Percent Paced: 0.42 %
Date Time Interrogation Session: 20211115033424
HighPow Impedance: 66 Ohm
Implantable Lead Implant Date: 20150107
Implantable Lead Implant Date: 20150107
Implantable Lead Implant Date: 20150107
Implantable Lead Location: 753857
Implantable Lead Location: 753859
Implantable Lead Location: 753860
Implantable Lead Model: 4298
Implantable Lead Model: 5076
Implantable Lead Model: 6935
Implantable Pulse Generator Implant Date: 20150107
Lead Channel Impedance Value: 304 Ohm
Lead Channel Impedance Value: 304 Ohm
Lead Channel Impedance Value: 304 Ohm
Lead Channel Impedance Value: 323 Ohm
Lead Channel Impedance Value: 342 Ohm
Lead Channel Impedance Value: 399 Ohm
Lead Channel Impedance Value: 437 Ohm
Lead Channel Impedance Value: 437 Ohm
Lead Channel Impedance Value: 494 Ohm
Lead Channel Impedance Value: 513 Ohm
Lead Channel Impedance Value: 589 Ohm
Lead Channel Impedance Value: 589 Ohm
Lead Channel Impedance Value: 589 Ohm
Lead Channel Pacing Threshold Amplitude: 0.75 V
Lead Channel Pacing Threshold Amplitude: 0.75 V
Lead Channel Pacing Threshold Amplitude: 5.5 V
Lead Channel Pacing Threshold Pulse Width: 0.4 ms
Lead Channel Pacing Threshold Pulse Width: 0.4 ms
Lead Channel Pacing Threshold Pulse Width: 0.8 ms
Lead Channel Sensing Intrinsic Amplitude: 1.75 mV
Lead Channel Sensing Intrinsic Amplitude: 1.75 mV
Lead Channel Sensing Intrinsic Amplitude: 24.5 mV
Lead Channel Sensing Intrinsic Amplitude: 24.5 mV
Lead Channel Setting Pacing Amplitude: 2 V
Lead Channel Setting Pacing Pulse Width: 0.4 ms
Lead Channel Setting Sensing Sensitivity: 0.3 mV

## 2020-04-10 NOTE — Progress Notes (Signed)
Remote ICD transmission.   

## 2020-04-11 ENCOUNTER — Other Ambulatory Visit: Payer: Self-pay | Admitting: Internal Medicine

## 2020-04-24 DIAGNOSIS — E7849 Other hyperlipidemia: Secondary | ICD-10-CM | POA: Diagnosis not present

## 2020-04-24 DIAGNOSIS — I10 Essential (primary) hypertension: Secondary | ICD-10-CM | POA: Diagnosis not present

## 2020-05-03 DIAGNOSIS — I639 Cerebral infarction, unspecified: Secondary | ICD-10-CM | POA: Diagnosis not present

## 2020-05-03 DIAGNOSIS — I10 Essential (primary) hypertension: Secondary | ICD-10-CM | POA: Diagnosis not present

## 2020-05-03 DIAGNOSIS — R262 Difficulty in walking, not elsewhere classified: Secondary | ICD-10-CM | POA: Diagnosis not present

## 2020-05-03 DIAGNOSIS — E1169 Type 2 diabetes mellitus with other specified complication: Secondary | ICD-10-CM | POA: Diagnosis not present

## 2020-05-03 DIAGNOSIS — N390 Urinary tract infection, site not specified: Secondary | ICD-10-CM | POA: Diagnosis not present

## 2020-05-10 ENCOUNTER — Ambulatory Visit (INDEPENDENT_AMBULATORY_CARE_PROVIDER_SITE_OTHER): Payer: Medicare Other

## 2020-05-10 DIAGNOSIS — I255 Ischemic cardiomyopathy: Secondary | ICD-10-CM

## 2020-05-10 LAB — CUP PACEART REMOTE DEVICE CHECK
Battery Remaining Longevity: 3 mo
Battery Voltage: 2.8 V
Brady Statistic AP VP Percent: 0 %
Brady Statistic AP VS Percent: 0 %
Brady Statistic AS VP Percent: 0.52 %
Brady Statistic AS VS Percent: 99.48 %
Brady Statistic RA Percent Paced: 0 %
Brady Statistic RV Percent Paced: 0.63 %
Date Time Interrogation Session: 20211216044225
HighPow Impedance: 65 Ohm
Implantable Lead Implant Date: 20150107
Implantable Lead Implant Date: 20150107
Implantable Lead Implant Date: 20150107
Implantable Lead Location: 753857
Implantable Lead Location: 753859
Implantable Lead Location: 753860
Implantable Lead Model: 4298
Implantable Lead Model: 5076
Implantable Lead Model: 6935
Implantable Pulse Generator Implant Date: 20150107
Lead Channel Impedance Value: 304 Ohm
Lead Channel Impedance Value: 304 Ohm
Lead Channel Impedance Value: 323 Ohm
Lead Channel Impedance Value: 323 Ohm
Lead Channel Impedance Value: 342 Ohm
Lead Channel Impedance Value: 380 Ohm
Lead Channel Impedance Value: 399 Ohm
Lead Channel Impedance Value: 437 Ohm
Lead Channel Impedance Value: 513 Ohm
Lead Channel Impedance Value: 513 Ohm
Lead Channel Impedance Value: 570 Ohm
Lead Channel Impedance Value: 589 Ohm
Lead Channel Impedance Value: 627 Ohm
Lead Channel Pacing Threshold Amplitude: 0.75 V
Lead Channel Pacing Threshold Amplitude: 0.75 V
Lead Channel Pacing Threshold Amplitude: 5.5 V
Lead Channel Pacing Threshold Pulse Width: 0.4 ms
Lead Channel Pacing Threshold Pulse Width: 0.4 ms
Lead Channel Pacing Threshold Pulse Width: 0.8 ms
Lead Channel Sensing Intrinsic Amplitude: 2.5 mV
Lead Channel Sensing Intrinsic Amplitude: 2.5 mV
Lead Channel Sensing Intrinsic Amplitude: 25.5 mV
Lead Channel Sensing Intrinsic Amplitude: 25.5 mV
Lead Channel Setting Pacing Amplitude: 2 V
Lead Channel Setting Pacing Pulse Width: 0.4 ms
Lead Channel Setting Sensing Sensitivity: 0.3 mV

## 2020-05-24 NOTE — Progress Notes (Signed)
Remote ICD transmission.   

## 2020-06-11 ENCOUNTER — Ambulatory Visit (INDEPENDENT_AMBULATORY_CARE_PROVIDER_SITE_OTHER): Payer: Medicare Other

## 2020-06-11 DIAGNOSIS — I255 Ischemic cardiomyopathy: Secondary | ICD-10-CM

## 2020-06-14 LAB — CUP PACEART REMOTE DEVICE CHECK
Battery Remaining Longevity: 2 mo
Battery Voltage: 2.79 V
Brady Statistic AP VP Percent: 0 %
Brady Statistic AP VS Percent: 0 %
Brady Statistic AS VP Percent: 0.65 %
Brady Statistic AS VS Percent: 99.35 %
Brady Statistic RA Percent Paced: 0 %
Brady Statistic RV Percent Paced: 0.75 %
Date Time Interrogation Session: 20220117022825
HighPow Impedance: 63 Ohm
Implantable Lead Implant Date: 20150107
Implantable Lead Implant Date: 20150107
Implantable Lead Implant Date: 20150107
Implantable Lead Location: 753857
Implantable Lead Location: 753859
Implantable Lead Location: 753860
Implantable Lead Model: 4298
Implantable Lead Model: 5076
Implantable Lead Model: 6935
Implantable Pulse Generator Implant Date: 20150107
Lead Channel Impedance Value: 304 Ohm
Lead Channel Impedance Value: 304 Ohm
Lead Channel Impedance Value: 323 Ohm
Lead Channel Impedance Value: 323 Ohm
Lead Channel Impedance Value: 380 Ohm
Lead Channel Impedance Value: 399 Ohm
Lead Channel Impedance Value: 437 Ohm
Lead Channel Impedance Value: 437 Ohm
Lead Channel Impedance Value: 513 Ohm
Lead Channel Impedance Value: 532 Ohm
Lead Channel Impedance Value: 570 Ohm
Lead Channel Impedance Value: 589 Ohm
Lead Channel Impedance Value: 646 Ohm
Lead Channel Pacing Threshold Amplitude: 0.75 V
Lead Channel Pacing Threshold Amplitude: 0.75 V
Lead Channel Pacing Threshold Amplitude: 5.5 V
Lead Channel Pacing Threshold Pulse Width: 0.4 ms
Lead Channel Pacing Threshold Pulse Width: 0.4 ms
Lead Channel Pacing Threshold Pulse Width: 0.8 ms
Lead Channel Sensing Intrinsic Amplitude: 2 mV
Lead Channel Sensing Intrinsic Amplitude: 2 mV
Lead Channel Sensing Intrinsic Amplitude: 24.375 mV
Lead Channel Sensing Intrinsic Amplitude: 24.375 mV
Lead Channel Setting Pacing Amplitude: 2 V
Lead Channel Setting Pacing Pulse Width: 0.4 ms
Lead Channel Setting Sensing Sensitivity: 0.3 mV

## 2020-06-26 NOTE — Addendum Note (Signed)
Addended by: Cheri Kearns A on: 06/26/2020 08:18 AM   Modules accepted: Level of Service

## 2020-06-26 NOTE — Progress Notes (Signed)
Remote ICD transmission.   

## 2020-07-12 ENCOUNTER — Ambulatory Visit (INDEPENDENT_AMBULATORY_CARE_PROVIDER_SITE_OTHER): Payer: Medicare Other

## 2020-07-12 DIAGNOSIS — I255 Ischemic cardiomyopathy: Secondary | ICD-10-CM

## 2020-07-12 DIAGNOSIS — I5022 Chronic systolic (congestive) heart failure: Secondary | ICD-10-CM

## 2020-07-12 LAB — CUP PACEART REMOTE DEVICE CHECK
Battery Remaining Longevity: 1 mo
Battery Voltage: 2.77 V
Brady Statistic AP VP Percent: 0 %
Brady Statistic AP VS Percent: 0 %
Brady Statistic AS VP Percent: 0.07 %
Brady Statistic AS VS Percent: 99.93 %
Brady Statistic RA Percent Paced: 0 %
Brady Statistic RV Percent Paced: 0.08 %
Date Time Interrogation Session: 20220217001806
HighPow Impedance: 63 Ohm
Implantable Lead Implant Date: 20150107
Implantable Lead Implant Date: 20150107
Implantable Lead Implant Date: 20150107
Implantable Lead Location: 753857
Implantable Lead Location: 753859
Implantable Lead Location: 753860
Implantable Lead Model: 4298
Implantable Lead Model: 5076
Implantable Lead Model: 6935
Implantable Pulse Generator Implant Date: 20150107
Lead Channel Impedance Value: 266 Ohm
Lead Channel Impedance Value: 304 Ohm
Lead Channel Impedance Value: 323 Ohm
Lead Channel Impedance Value: 323 Ohm
Lead Channel Impedance Value: 323 Ohm
Lead Channel Impedance Value: 380 Ohm
Lead Channel Impedance Value: 399 Ohm
Lead Channel Impedance Value: 437 Ohm
Lead Channel Impedance Value: 494 Ohm
Lead Channel Impedance Value: 494 Ohm
Lead Channel Impedance Value: 570 Ohm
Lead Channel Impedance Value: 570 Ohm
Lead Channel Impedance Value: 627 Ohm
Lead Channel Pacing Threshold Amplitude: 0.75 V
Lead Channel Pacing Threshold Amplitude: 0.75 V
Lead Channel Pacing Threshold Amplitude: 5.5 V
Lead Channel Pacing Threshold Pulse Width: 0.4 ms
Lead Channel Pacing Threshold Pulse Width: 0.4 ms
Lead Channel Pacing Threshold Pulse Width: 0.8 ms
Lead Channel Sensing Intrinsic Amplitude: 2.75 mV
Lead Channel Sensing Intrinsic Amplitude: 2.75 mV
Lead Channel Sensing Intrinsic Amplitude: 23.625 mV
Lead Channel Sensing Intrinsic Amplitude: 23.625 mV
Lead Channel Setting Pacing Amplitude: 2 V
Lead Channel Setting Pacing Pulse Width: 0.4 ms
Lead Channel Setting Sensing Sensitivity: 0.3 mV

## 2020-07-18 NOTE — Progress Notes (Signed)
Remote ICD transmission.   

## 2020-08-03 ENCOUNTER — Encounter: Payer: Self-pay | Admitting: Gastroenterology

## 2020-08-13 ENCOUNTER — Ambulatory Visit (INDEPENDENT_AMBULATORY_CARE_PROVIDER_SITE_OTHER): Payer: Medicare Other

## 2020-08-13 DIAGNOSIS — I255 Ischemic cardiomyopathy: Secondary | ICD-10-CM

## 2020-08-13 LAB — CUP PACEART REMOTE DEVICE CHECK
Battery Remaining Longevity: 1 mo
Battery Voltage: 2.77 V
Brady Statistic AP VP Percent: 0 %
Brady Statistic AP VS Percent: 0 %
Brady Statistic AS VP Percent: 0.34 %
Brady Statistic AS VS Percent: 99.66 %
Brady Statistic RA Percent Paced: 0 %
Brady Statistic RV Percent Paced: 0.4 %
Date Time Interrogation Session: 20220321012404
HighPow Impedance: 64 Ohm
Implantable Lead Implant Date: 20150107
Implantable Lead Implant Date: 20150107
Implantable Lead Implant Date: 20150107
Implantable Lead Location: 753857
Implantable Lead Location: 753859
Implantable Lead Location: 753860
Implantable Lead Model: 4298
Implantable Lead Model: 5076
Implantable Lead Model: 6935
Implantable Pulse Generator Implant Date: 20150107
Lead Channel Impedance Value: 304 Ohm
Lead Channel Impedance Value: 323 Ohm
Lead Channel Impedance Value: 323 Ohm
Lead Channel Impedance Value: 342 Ohm
Lead Channel Impedance Value: 380 Ohm
Lead Channel Impedance Value: 456 Ohm
Lead Channel Impedance Value: 456 Ohm
Lead Channel Impedance Value: 494 Ohm
Lead Channel Impedance Value: 532 Ohm
Lead Channel Impedance Value: 570 Ohm
Lead Channel Impedance Value: 665 Ohm
Lead Channel Impedance Value: 665 Ohm
Lead Channel Impedance Value: 722 Ohm
Lead Channel Pacing Threshold Amplitude: 0.75 V
Lead Channel Pacing Threshold Amplitude: 0.75 V
Lead Channel Pacing Threshold Amplitude: 5.5 V
Lead Channel Pacing Threshold Pulse Width: 0.4 ms
Lead Channel Pacing Threshold Pulse Width: 0.4 ms
Lead Channel Pacing Threshold Pulse Width: 0.8 ms
Lead Channel Sensing Intrinsic Amplitude: 2.25 mV
Lead Channel Sensing Intrinsic Amplitude: 2.25 mV
Lead Channel Sensing Intrinsic Amplitude: 28.125 mV
Lead Channel Sensing Intrinsic Amplitude: 28.125 mV
Lead Channel Setting Pacing Amplitude: 2 V
Lead Channel Setting Pacing Pulse Width: 0.4 ms
Lead Channel Setting Sensing Sensitivity: 0.3 mV

## 2020-08-21 NOTE — Progress Notes (Signed)
Remote ICD transmission.   

## 2020-09-13 ENCOUNTER — Ambulatory Visit (INDEPENDENT_AMBULATORY_CARE_PROVIDER_SITE_OTHER): Payer: Medicare Other

## 2020-09-13 ENCOUNTER — Other Ambulatory Visit: Payer: Self-pay | Admitting: Internal Medicine

## 2020-09-13 DIAGNOSIS — I255 Ischemic cardiomyopathy: Secondary | ICD-10-CM

## 2020-09-13 LAB — CUP PACEART REMOTE DEVICE CHECK
Battery Remaining Longevity: 1 mo
Battery Voltage: 2.75 V
Brady Statistic AP VP Percent: 0 %
Brady Statistic AP VS Percent: 0 %
Brady Statistic AS VP Percent: 0.02 %
Brady Statistic AS VS Percent: 99.98 %
Brady Statistic RA Percent Paced: 0 %
Brady Statistic RV Percent Paced: 0.02 %
Date Time Interrogation Session: 20220421022603
HighPow Impedance: 61 Ohm
Implantable Lead Implant Date: 20150107
Implantable Lead Implant Date: 20150107
Implantable Lead Implant Date: 20150107
Implantable Lead Location: 753857
Implantable Lead Location: 753859
Implantable Lead Location: 753860
Implantable Lead Model: 4298
Implantable Lead Model: 5076
Implantable Lead Model: 6935
Implantable Pulse Generator Implant Date: 20150107
Lead Channel Impedance Value: 323 Ohm
Lead Channel Impedance Value: 323 Ohm
Lead Channel Impedance Value: 323 Ohm
Lead Channel Impedance Value: 342 Ohm
Lead Channel Impedance Value: 380 Ohm
Lead Channel Impedance Value: 399 Ohm
Lead Channel Impedance Value: 456 Ohm
Lead Channel Impedance Value: 494 Ohm
Lead Channel Impedance Value: 532 Ohm
Lead Channel Impedance Value: 589 Ohm
Lead Channel Impedance Value: 703 Ohm
Lead Channel Impedance Value: 703 Ohm
Lead Channel Impedance Value: 722 Ohm
Lead Channel Pacing Threshold Amplitude: 0.75 V
Lead Channel Pacing Threshold Amplitude: 0.875 V
Lead Channel Pacing Threshold Amplitude: 5.5 V
Lead Channel Pacing Threshold Pulse Width: 0.4 ms
Lead Channel Pacing Threshold Pulse Width: 0.4 ms
Lead Channel Pacing Threshold Pulse Width: 0.8 ms
Lead Channel Sensing Intrinsic Amplitude: 2.25 mV
Lead Channel Sensing Intrinsic Amplitude: 2.25 mV
Lead Channel Sensing Intrinsic Amplitude: 24.875 mV
Lead Channel Sensing Intrinsic Amplitude: 24.875 mV
Lead Channel Setting Pacing Amplitude: 2 V
Lead Channel Setting Pacing Pulse Width: 0.4 ms
Lead Channel Setting Sensing Sensitivity: 0.3 mV

## 2020-09-17 ENCOUNTER — Telehealth: Payer: Self-pay

## 2020-09-17 NOTE — Telephone Encounter (Signed)
Carelink Alert transmission for device at RRT as of 09/17/20.    Last OV 03/20/20, no indication in notes that Gen change discussed, pt will need OV wth MD or APP to discuss procedure.    Pt is not device dependant.  Device currently programmed VVI, LV lead turned off due to elevated thresholds and diaphragmatic stim.   Attempted to call pt t advised and discuss what to do about patient notifier.  No answer, LVM for pt to call back.

## 2020-09-19 NOTE — Telephone Encounter (Signed)
Left message on voicemail for patient to contact the [facility] at [phone].

## 2020-10-01 NOTE — Telephone Encounter (Signed)
Third unsuccessful telephone encounter to Kathryn Dixon to follow up on needed appointment with Dr. Cristopher Peru to discuss ICD generator change as patient's current CRT-D reached ERI 09/17/20. Unable to leave message and "person is unavailable". Certified letter sent requesting return call to device clinic ASAP.

## 2020-10-02 NOTE — Progress Notes (Signed)
Remote ICD transmission.   

## 2020-10-05 ENCOUNTER — Telehealth: Payer: Self-pay

## 2020-10-05 NOTE — Telephone Encounter (Signed)
I let the patient know per the nurse her ICD has reached RRT as of 09/17/2020. I let her know we have 3 months to get her changed out. I let her speak with [place] to get an appointment with Dr. Lovena Le.

## 2020-10-15 ENCOUNTER — Ambulatory Visit (INDEPENDENT_AMBULATORY_CARE_PROVIDER_SITE_OTHER): Payer: Medicare Other

## 2020-10-15 DIAGNOSIS — I255 Ischemic cardiomyopathy: Secondary | ICD-10-CM

## 2020-10-16 LAB — CUP PACEART REMOTE DEVICE CHECK
Battery Remaining Longevity: 1 mo
Battery Voltage: 2.72 V
Brady Statistic AP VP Percent: 0 %
Brady Statistic AP VS Percent: 0 %
Brady Statistic AS VP Percent: 0.06 %
Brady Statistic AS VS Percent: 99.94 %
Brady Statistic RA Percent Paced: 0 %
Brady Statistic RV Percent Paced: 0.07 %
Date Time Interrogation Session: 20220523074228
HighPow Impedance: 62 Ohm
Implantable Lead Implant Date: 20150107
Implantable Lead Implant Date: 20150107
Implantable Lead Implant Date: 20150107
Implantable Lead Location: 753857
Implantable Lead Location: 753859
Implantable Lead Location: 753860
Implantable Lead Model: 4298
Implantable Lead Model: 5076
Implantable Lead Model: 6935
Implantable Pulse Generator Implant Date: 20150107
Lead Channel Impedance Value: 323 Ohm
Lead Channel Impedance Value: 323 Ohm
Lead Channel Impedance Value: 323 Ohm
Lead Channel Impedance Value: 342 Ohm
Lead Channel Impedance Value: 342 Ohm
Lead Channel Impedance Value: 437 Ohm
Lead Channel Impedance Value: 437 Ohm
Lead Channel Impedance Value: 456 Ohm
Lead Channel Impedance Value: 532 Ohm
Lead Channel Impedance Value: 589 Ohm
Lead Channel Impedance Value: 665 Ohm
Lead Channel Impedance Value: 665 Ohm
Lead Channel Impedance Value: 703 Ohm
Lead Channel Pacing Threshold Amplitude: 0.75 V
Lead Channel Pacing Threshold Amplitude: 0.75 V
Lead Channel Pacing Threshold Amplitude: 5.5 V
Lead Channel Pacing Threshold Pulse Width: 0.4 ms
Lead Channel Pacing Threshold Pulse Width: 0.4 ms
Lead Channel Pacing Threshold Pulse Width: 0.8 ms
Lead Channel Sensing Intrinsic Amplitude: 2.25 mV
Lead Channel Sensing Intrinsic Amplitude: 2.25 mV
Lead Channel Sensing Intrinsic Amplitude: 24.5 mV
Lead Channel Sensing Intrinsic Amplitude: 24.5 mV
Lead Channel Setting Pacing Amplitude: 2 V
Lead Channel Setting Pacing Pulse Width: 0.4 ms
Lead Channel Setting Sensing Sensitivity: 0.3 mV

## 2020-10-22 NOTE — Progress Notes (Addendum)
Cardiology Office Note Date:  10/23/2020  Patient ID:  Kathryn, Dixon 05-27-1945, MRN [id number] PCP:  Lucianne Lei, MD  Cardiologist/Electrophysiologist: Dr. Lovena Le    Chief Complaint: device is RRT  History of Present Illness: Kathryn Dixon is a 75 y.o. female with history of CAD (CABG 2014), ICM, LBBB, CRT-D (LV lead is programmed off 2/2 high thresholds and diaphragmatic stim), HTN, HLD, chronic CHF (systolic)  She comes in today to be seen for Dr. Lovena Le, last seen by him Oct 2021, she was working on quitting smoking, doing well, described class II symptoms. Noted LV lead was programmed off and her device programmed VVI 40.  TODAY She is doing "OK". She lives in her own home, cares for her home independently, denies any difficulties with ADLs, or heavier chores like Pharmacist, hospital. She denies CP, palpitations or cany kind of cardiac awareness. No rest SOB, no DOE with her ADLS, says that her legs give out before she gets winded. Denies symptoms of PND or orthopnea. No dizzy spells, near syncope ro syncope.  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  No escalation over the years.  No rest discomfort   Device information MDT CRT-D implanted 06/01/2013 LV lead is programmed OFF 2/2 high thresholds and diaphragmatic stim.   Past Medical History:  Diagnosis Date  . Coronary artery disease   . Exertional shortness of breath   . Hypertension   . Ischemic cardiomyopathy   . LBBB (left bundle branch block)   . Myocardial infarction ([place]) 1990's;01/2013   ; "mild, right before OHS"  . S/P CABG x 2 - LIMA-LAD, SVG-OM, 01/27/13 01/28/2013  . Smoker   . Thoracic aneurysm without mention of rupture     Past Surgical History:  Procedure Laterality Date  . ABDOMINAL HYSTERECTOMY    . BI-VENTRICULAR IMPLANTABLE CARDIOVERTER DEFIBRILLATOR N/A 06/01/2013    Procedure: BI-VENTRICULAR IMPLANTABLE CARDIOVERTER DEFIBRILLATOR  (CRT-D);  Surgeon: Evans Lance, MD;  [facility] C[facility] LAB;  Service: Cardiovascular;  Laterality: N/A;  . COLONOSCOPY WITH PROPOFOL N/A 10/07/2016   Procedure: COLONOSCOPY WITH PROPOFOL;  Surgeon: Manus Gunning, MD;  Location: [facility] ENDOSCOPY;  Service: Gastroenterology;  Laterality: N/A;  . CORONARY ARTERY BYPASS GRAFT N/A 01/27/2013   Procedure: CORONARY ARTERY BYPASS GRAFTING times two using Right Greater Saphenous Vein Graft Harvsted Endoscopically and Left Internal Mammary Artery;  Surgeon: Gaye Pollack, MD;  Location: [place] OR;  Service: Open Heart Surgery;  Laterality: N/A;  . DILATION AND CURETTAGE OF UTERUS    . FRACTURE SURGERY    . INTRAOPERATIVE TRANSESOPHAGEAL ECHOCARDIOGRAM N/A 01/27/2013   Procedure: INTRAOPERATIVE TRANSESOPHAGEAL ECHOCARDIOGRAM;  Surgeon: Gaye Pollack, MD;  Location: [facility] OR;  Service: Open Heart Surgery;  Laterality: N/A;  . LEFT HEART CATHETERIZATION WITH CORONARY ANGIOGRAM N/A 01/20/2013   Procedure: LEFT HEART CATHETERIZATION WITH CORONARY ANGIOGRAM;  Surgeon: Leonie Man, MD;  Location: [facility] CATH LAB;  Service: Cardiovascular;  Laterality: N/A;  . RIGHT HEART CATHETERIZATION  01/20/2013   Procedure: RIGHT HEART CATH;  Surgeon: Leonie Man, MD;  Location: [facility] CATH LAB;  Service: Cardiovascular;;  . TUBAL LIGATION      Current Outpatient Medications  Medication Sig Dispense Refill  . aspirin EC 81 MG tablet Take 81 mg by mouth daily.    . hydrochlorothiazide (MICROZIDE) 12.5 MG capsule TAKE 1 CAPSULE BY  MOUTH EVERY DAY 90 capsule 1  . lisinopril (ZESTRIL) 5 MG tablet TAKE 1 TABLET BY MOUTH EVERY DAY 90 tablet 3  . metoprolol tartrate (LOPRESSOR) 50 MG tablet TAKE 1 TABLET(50 MG) BY MOUTH TWICE DAILY 180 tablet 1  . vitamin E 200 UNIT capsule Take 200 Units by mouth daily.     No current facility-administered medications for this visit.    Allergies:   Patient has no known  allergies.   Social History:  The patient  reports that she has been smoking cigarettes. She has a 6.00 pack-year smoking history. She has never used smokeless tobacco. She reports current alcohol use. She reports that she does not use drugs.   Family History:  The patient's family history includes Brain cancer in her brother; Heart disease in her father; Hypertension in her mother; Throat cancer in her brother.  ROS:  Please see the history of present illness.    All other systems are reviewed and otherwise negative.   PHYSICAL EXAM:  VS:  BP 98/60   Pulse 66   Ht 5\' 4"  (1.626 m)   Wt 121 lb 6.4 oz (55.1 kg)   SpO2 97%   BMI 20.84 kg/m  BMI: Body mass index is 20.84 kg/m. Well nourished, well developed, in no acute distress HEENT: normocephalic, atraumatic Neck: no JVD, carotid bruits or masses Cardiac:  RRR; no significant murmurs, no rubs, or gallops Lungs:  CTA b/l, no wheezing, rhonchi or rales Abd: soft, nontender MS: no deformity, age appropriateatrophy Ext: trace edema, L ankle chronically larger then the R (she reports since a fracture/surgery years ago).  Feet are warm, difficult to palpate any pedal pulses Skin: warm and dry, no rash Neuro:  No gross deficits appreciated Psych: euthymic mood, full affect  ICD site is stable, no tethering or discomfort   EKG:  Done today and reviewed by myself shows  SR, LBBB, PVCs  Device interrogation done today and reviewed by myself:  Battery reached RRT 09/17/20 Lead measurements are good 2NSVT 3SVT All are reviewed, morphology same for all, and by EGMs unchanged from baseline No true VT VP 0.3%   05/09/2013: TTE Study Conclusions  - Left ventricle: The cavity size was moderately dilated.  There was mild concentric hypertrophy. Systolic function  was severely reduced. The estimated ejection fraction was  in the range of 20% to 25%. Diffuse hypokinesis. Regional  wall motion abnormalities cannot be excluded.  Features are  consistent with a pseudonormal left ventricular filling  pattern, with concomitant abnormal relaxation and  increased filling pressure (grade 2 diastolic  dysfunction). Doppler parameters are consistent with both  elevated ventricular end-diastolic filling pressure and  elevated left atrial filling pressure.  - Aortic valve: Trileaflet; mildly thickened, mildly  calcified leaflets. Mild regurgitation.  - Mitral valve: Calcified annulus. Mild to moderate  regurgitation.  - Left atrium: The atrium was mildly to moderately dilated.  - Right ventricle: Systolic function was mildly reduced.  Systolic pressure was increased.  - Atrial septum: No defect or patent foramen ovale was  identified.  - Tricuspid valve: Mild regurgitation.  - Pulmonic valve: Mild regurgitation.  - Pulmonary arteries: PA peak pressure: 72mm Hg (S).  Impressions:   - The right ventricular systolic pressure was increased  consistent with moderate pulmonary hypertension.     Recent Labs: No results found for requested labs within last 8760 hours.  No results found for requested labs within last 8760 hours.   CrCl cannot be calculated (Patient's most  recent lab result is older than the maximum 21 days allowed.).   Wt Readings from Last 3 Encounters:  10/23/20 121 lb 6.4 oz (55.1 kg)  03/20/20 130 lb (59 kg)  06/16/19 125 lb 3.2 oz (56.8 kg)     Other studies reviewed: Additional studies/records reviewed today include: summarized above  ASSESSMENT AND PLAN:  1. ICD     Reached RRT 09/17/20  Discussed with Dr. Lovena Le, not likely to plan for lead revision of her LV lead. Discussed with the patient generator change procedure, potential risks and benefots, she is agreeable to proceed.  2. CAD     No anginal complaints     On ASA, BB, statin  3. ICM 4. Chronic CHF (systolic) 5. LBBB     Class 1-II symptoms     OptiVol looks good     She has not had issues with HF      On BB, ACE, diuretic  Dr. Lovena Le will discuss option of LV lead revision/L bundle pacing perhaps prior to her procedure Suspect not likely to pursue I briefly discussed the idea of new lead with the patient, though she is not likely to be inclined to do mor then a gen change   6. HTN     Looks OK  7. HLD     Not addressed today  8. Claudication ?     Reports legs fatigue on her, muscles ache, though unchanged for many years     Unable to palpated pedal pulses today, feet/toes are warm, no wounds.     Recommend ABI/art [place] to evaluate though she declines    Disposition: F/u with the usual post procedure follow up    Current medicines are reviewed at length with the patient today.  The patient did not have any concerns regarding medicines.  Venetia Night, PA-C 10/23/2020 1:18 PM     [place] [address] [phone] (office)  [phone] (fax)

## 2020-10-23 ENCOUNTER — Encounter: Payer: Self-pay | Admitting: Physician Assistant

## 2020-10-23 ENCOUNTER — Ambulatory Visit (INDEPENDENT_AMBULATORY_CARE_PROVIDER_SITE_OTHER): Payer: Medicare Other | Admitting: Physician Assistant

## 2020-10-23 ENCOUNTER — Other Ambulatory Visit: Payer: Self-pay

## 2020-10-23 VITALS — BP 98/60 | HR 66 | Ht 64.0 in | Wt 121.4 lb

## 2020-10-23 DIAGNOSIS — I10 Essential (primary) hypertension: Secondary | ICD-10-CM

## 2020-10-23 DIAGNOSIS — I2510 Atherosclerotic heart disease of native coronary artery without angina pectoris: Secondary | ICD-10-CM

## 2020-10-23 DIAGNOSIS — I5022 Chronic systolic (congestive) heart failure: Secondary | ICD-10-CM

## 2020-10-23 DIAGNOSIS — Z4502 Encounter for adjustment and management of automatic implantable cardiac defibrillator: Secondary | ICD-10-CM | POA: Diagnosis not present

## 2020-10-23 DIAGNOSIS — I447 Left bundle-branch block, unspecified: Secondary | ICD-10-CM

## 2020-10-23 DIAGNOSIS — Z95810 Presence of automatic (implantable) cardiac defibrillator: Secondary | ICD-10-CM

## 2020-10-23 DIAGNOSIS — Z01818 Encounter for other preprocedural examination: Secondary | ICD-10-CM | POA: Diagnosis not present

## 2020-10-23 DIAGNOSIS — I255 Ischemic cardiomyopathy: Secondary | ICD-10-CM | POA: Diagnosis not present

## 2020-10-23 LAB — BASIC METABOLIC PANEL
BUN/Creatinine Ratio: 36 — ABNORMAL HIGH (ref 12–28)
BUN: 33 mg/dL — ABNORMAL HIGH (ref 8–27)
CO2: 26 mmol/L (ref 20–29)
Calcium: 11.1 mg/dL — ABNORMAL HIGH (ref 8.7–10.3)
Chloride: 101 mmol/L (ref 96–106)
Creatinine, Ser: 0.92 mg/dL (ref 0.57–1.00)
Glucose: 96 mg/dL (ref 65–99)
Potassium: 4.2 mmol/L (ref 3.5–5.2)
Sodium: 136 mmol/L (ref 134–144)
eGFR: 65 mL/min/{1.73_m2} (ref >59)

## 2020-10-23 LAB — CUP PACEART INCLINIC DEVICE CHECK
Battery Remaining Longevity: 1 mo
Battery Voltage: 2.72 V
Brady Statistic AP VP Percent: 0 %
Brady Statistic AP VS Percent: 0 %
Brady Statistic AS VP Percent: 0.28 %
Brady Statistic AS VS Percent: 99.72 %
Brady Statistic RA Percent Paced: 0 %
Brady Statistic RV Percent Paced: 0.33 %
Date Time Interrogation Session: 20220531173038
HighPow Impedance: 63 Ohm
Implantable Lead Implant Date: 20150107
Implantable Lead Implant Date: 20150107
Implantable Lead Implant Date: 20150107
Implantable Lead Location: 753857
Implantable Lead Location: 753859
Implantable Lead Location: 753860
Implantable Lead Model: 4298
Implantable Lead Model: 5076
Implantable Lead Model: 6935
Implantable Pulse Generator Implant Date: 20150107
Lead Channel Impedance Value: 323 Ohm
Lead Channel Impedance Value: 323 Ohm
Lead Channel Impedance Value: 323 Ohm
Lead Channel Impedance Value: 342 Ohm
Lead Channel Impedance Value: 342 Ohm
Lead Channel Impedance Value: 437 Ohm
Lead Channel Impedance Value: 456 Ohm
Lead Channel Impedance Value: 494 Ohm
Lead Channel Impedance Value: 532 Ohm
Lead Channel Impedance Value: 570 Ohm
Lead Channel Impedance Value: 646 Ohm
Lead Channel Impedance Value: 646 Ohm
Lead Channel Impedance Value: 703 Ohm
Lead Channel Pacing Threshold Amplitude: 0.75 V
Lead Channel Pacing Threshold Amplitude: 0.75 V
Lead Channel Pacing Threshold Amplitude: 5.5 V
Lead Channel Pacing Threshold Pulse Width: 0.4 ms
Lead Channel Pacing Threshold Pulse Width: 0.4 ms
Lead Channel Pacing Threshold Pulse Width: 0.8 ms
Lead Channel Sensing Intrinsic Amplitude: 1.75 mV
Lead Channel Sensing Intrinsic Amplitude: 2.25 mV
Lead Channel Sensing Intrinsic Amplitude: 31.25 mV
Lead Channel Sensing Intrinsic Amplitude: 31.625 mV
Lead Channel Setting Pacing Amplitude: 2 V
Lead Channel Setting Pacing Pulse Width: 0.4 ms
Lead Channel Setting Sensing Sensitivity: 0.3 mV

## 2020-10-23 LAB — CBC
Hematocrit: 40 % (ref 34.0–46.6)
Hemoglobin: 13.3 g/dL (ref 11.1–15.9)
MCH: 26.7 pg (ref 26.6–33.0)
MCHC: 33.3 g/dL (ref 31.5–35.7)
MCV: 80 fL (ref 79–97)
Platelets: 248 10*3/uL (ref 150–450)
RBC: 4.98 x10E6/uL (ref 3.77–5.28)
RDW: 18.3 % — ABNORMAL HIGH (ref 11.7–15.4)
WBC: 4.5 10*3/uL (ref 3.4–10.8)

## 2020-10-23 NOTE — Patient Instructions (Addendum)
Medication Instructions:   Your physician recommends that you continue on your current medications as directed. Please refer to the Current Medication list given to you today.   *If you need a refill on your cardiac medications before your next appointment, please call your pharmacy*   Lab Work: BMET AND CBC TODAY   If you have labs (blood work) drawn today and your tests are completely normal, you will receive your results only by: Marland Kitchen MyChart Message (if you have MyChart) OR . A paper copy in the mail If you have any lab test that is abnormal or we need to change your treatment, we will call you to review the results.   Testing/Procedures: SEE BELOW     Follow-Up: At [facility], you and your health needs are our priority.  As part of our continuing mission to provide you with exceptional heart care, we have created designated Provider Care Teams.  These Care Teams include your primary Cardiologist (physician) and Advanced Practice Providers (APPs -  Physician Assistants and Nurse Practitioners) who all work together to provide you with the care you need, when you need it.  We recommend signing up for the patient portal called "MyChart".  Sign up information is provided on this After Visit Summary.  MyChart is used to connect with patients for Virtual Visits (Telemedicine).  Patients are able to view lab/test results, encounter notes, upcoming appointments, etc.  Non-urgent messages can be sent to your provider as well.   To learn more about what you can do with MyChart, go to [email or web address].    Your next appointment:  [place]  10-31-20 DEVICE CLINIC WOUND CHECK   47 DAYS AFTER 10-31-2020 WITH PROVIDER   The format for your next appointment:   In Person  Provider:    Cristopher Peru, MD   Other Instructions   Implantable Device Instructions  You are scheduled for: 10-31-20   with Dr. Lovena Le for your device battery change  2.On the day of your procedure 10-31-20 you  will go to [facility] [phone]) at 10:30 am .  Dennis Bast will go to the main entrance [address] [employer]) and enter where the [employer] are.  You will check in at ADMITTING.  You may have one support person come in to the hospital with you.  They will be asked to wait in the waiting room.   3.   Do not eat or drink after midnight prior to your procedure.   4.  On the morning of your procedure do NOT take any medication. FLUID PILL (HYDROCHLORITHIAZIDE) THE  MORNING OF PROCEDURE  5.  The night before your procedure and the morning of your procedure scrub your neck/chest with surgical scrub.  See instruction letter.   5.  Plan for an overnight stay, but you may be discharged home after your procedure. If you use your phone frequently bring your phone charger, in case you have to stay.  If you are discharged after your procedure you will need someone to drive you home and be with your for 24 hours after your procedure.   6.  You will follow up with the [facility] clinic 10-14 days after your procedure. You will follow up with Dr. Lovena Le 91 days after your procedure.  These appointments will be made for you.   * If you have ANY questions after you get home, please call the office [phone]

## 2020-10-30 NOTE — Pre-Procedure Instructions (Signed)
Attempted to call patient to go over procedure instructions for tomorrow.  No answer

## 2020-10-31 ENCOUNTER — Ambulatory Visit (HOSPITAL_COMMUNITY)
Admission: RE | Admit: 2020-10-31 | Discharge: 2020-10-31 | Disposition: A | Discharge status: Home / Self Care | Payer: Medicare Other | Attending: Internal Medicine | Admitting: Internal Medicine

## 2020-10-31 ENCOUNTER — Telehealth: Payer: Self-pay

## 2020-10-31 ENCOUNTER — Encounter (HOSPITAL_COMMUNITY)
Admission: RE | Disposition: A | Discharge status: Home / Self Care | Payer: Self-pay | Source: Home / Self Care | Attending: Internal Medicine

## 2020-10-31 ENCOUNTER — Other Ambulatory Visit: Payer: Self-pay

## 2020-10-31 DIAGNOSIS — Z4502 Encounter for adjustment and management of automatic implantable cardiac defibrillator: Secondary | ICD-10-CM

## 2020-10-31 DIAGNOSIS — I255 Ischemic cardiomyopathy: Secondary | ICD-10-CM | POA: Insufficient documentation

## 2020-10-31 DIAGNOSIS — I5022 Chronic systolic (congestive) heart failure: Secondary | ICD-10-CM | POA: Diagnosis not present

## 2020-10-31 DIAGNOSIS — Z7982 Long term (current) use of aspirin: Secondary | ICD-10-CM | POA: Diagnosis not present

## 2020-10-31 DIAGNOSIS — F17210 Nicotine dependence, cigarettes, uncomplicated: Secondary | ICD-10-CM | POA: Diagnosis not present

## 2020-10-31 DIAGNOSIS — I447 Left bundle-branch block, unspecified: Secondary | ICD-10-CM | POA: Insufficient documentation

## 2020-10-31 DIAGNOSIS — Z79899 Other long term (current) drug therapy: Secondary | ICD-10-CM | POA: Diagnosis not present

## 2020-10-31 HISTORY — PX: ICD GENERATOR CHANGEOUT: EP1231

## 2020-10-31 SURGERY — ICD GENERATOR CHANGEOUT
Anesthesia: LOCAL

## 2020-10-31 MED ORDER — LIDOCAINE HCL (PF) 1 % IJ SOLN
INTRAMUSCULAR | Status: AC
  Filled 2020-10-31: qty 60

## 2020-10-31 MED ORDER — SODIUM CHLORIDE 0.9 % IV SOLN
INTRAVENOUS | Status: AC
  Filled 2020-10-31: qty 2

## 2020-10-31 MED ORDER — POVIDONE-IODINE 10 % EX SWAB
2.0000 | Freq: Once | CUTANEOUS | Status: AC
Start: 2020-10-31 — End: 2020-10-31
  Administered 2020-10-31: 2 via TOPICAL

## 2020-10-31 MED ORDER — LIDOCAINE HCL (PF) 1 % IJ SOLN
INTRAMUSCULAR | Status: DC | PRN
  Administered 2020-10-31: 60 mL

## 2020-10-31 MED ORDER — CEFAZOLIN SODIUM-DEXTROSE 2-4 GM/100ML-% IV SOLN
INTRAVENOUS | Status: AC
  Filled 2020-10-31: qty 100

## 2020-10-31 MED ORDER — SODIUM CHLORIDE 0.9 % IV SOLN: INTRAVENOUS | Status: DC

## 2020-10-31 MED ORDER — FENTANYL CITRATE (PF) 100 MCG/2ML IJ SOLN
INTRAMUSCULAR | Status: AC
  Filled 2020-10-31: qty 2

## 2020-10-31 MED ORDER — MIDAZOLAM HCL 5 MG/5ML IJ SOLN
INTRAMUSCULAR | Status: DC | PRN
  Administered 2020-10-31: 1 mg via INTRAVENOUS

## 2020-10-31 MED ORDER — CEFAZOLIN SODIUM-DEXTROSE 2-4 GM/100ML-% IV SOLN
2.0000 g | INTRAVENOUS | Status: AC
  Administered 2020-10-31: 2 g via INTRAVENOUS

## 2020-10-31 MED ORDER — MIDAZOLAM HCL 5 MG/5ML IJ SOLN
INTRAMUSCULAR | Status: AC
  Filled 2020-10-31: qty 5

## 2020-10-31 MED ORDER — FENTANYL CITRATE (PF) 100 MCG/2ML IJ SOLN
INTRAMUSCULAR | Status: DC | PRN
  Administered 2020-10-31: 12.5 ug via INTRAVENOUS

## 2020-10-31 MED ORDER — CHLORHEXIDINE GLUCONATE 4 % EX LIQD: 4.0000 "application " | Freq: Once | CUTANEOUS | Status: DC

## 2020-10-31 MED ORDER — SODIUM CHLORIDE 0.9 % IV SOLN
80.0000 mg | INTRAVENOUS | Status: AC
  Administered 2020-10-31: 80 mg

## 2020-10-31 MED ORDER — ACETAMINOPHEN 325 MG PO TABS
325.0000 mg | ORAL_TABLET | ORAL | Status: DC | PRN
  Filled 2020-10-31: qty 2

## 2020-10-31 MED ORDER — ONDANSETRON HCL 4 MG/2ML IJ SOLN: 4.0000 mg | Freq: Four times a day (QID) | INTRAMUSCULAR | Status: DC | PRN

## 2020-10-31 SURGICAL SUPPLY — 4 items
CABLE SURGICAL S-101-97-12 (CABLE) ×2 IMPLANT
ICD CLARIA MRI DTMA1Q1 (ICD Generator) ×1 IMPLANT
PAD PRO RADIOLUCENT 2001M-C (PAD) ×2 IMPLANT
TRAY PACEMAKER INSERTION (PACKS) ×2 IMPLANT

## 2020-10-31 NOTE — Telephone Encounter (Signed)
Spoke with patient. Patient states no patient but states that the feeling is continuous and irritating. Patient has a device clinic appointment at 08:30 am on 10/31/20. Patient has a CRT-D.

## 2020-10-31 NOTE — H&P (Signed)
HPI Kathryn Dixon returns today for followup. She is a pleasant 75yo woman with anICM, chronic systolic heart failure, LBBB, s/p BiV ICD. In the interim, she has done well. No chest pain. No ICD shock. She was noted to have an increase in her LV pacing threshold and this resulted in diaphragmatic stimulation. Her LV was turned off. She has cut back on her smoking but still has not stopped. No chest pain or sob. No Known Allergies         Current Outpatient Medications  Medication Sig Dispense Refill  . lisinopril (ZESTRIL) 5 MG tablet TAKE 1 TABLET BY MOUTH EVERY DAY. ANNUAL APPT WITH DISCARD REMAINDER. Bailen Geffre DUE FOR JANUARY BEFORE ANY MORE REFILLS.(1ST ATTEMPT) 90 tablet 3  . aspirin EC 81 MG tablet Take 81 mg by mouth daily.    . hydrochlorothiazide (MICROZIDE) 12.5 MG capsule TAKE 1 CAPSULE BY MOUTH EVERY DAY 90 capsule 2  . metoprolol tartrate (LOPRESSOR) 50 MG tablet Take 1 tablet (50 mg total) by mouth 2 (two) times daily. 180 tablet 3  . rosuvastatin (CRESTOR) 20 MG tablet Take 20 mg by mouth daily.    . sacubitril-valsartan (ENTRESTO) 24-26 MG Take 1 tablet by mouth daily.    . vitamin E 200 UNIT capsule Take 200 Units by mouth daily.     No current facility-administered medications for this visit.         Past Medical History:  Diagnosis Date  . Coronary artery disease   . Exertional shortness of breath   . Hypertension   . Ischemic cardiomyopathy   . LBBB (left bundle branch block)   . Myocardial infarction ([place]) 1990's;01/2013   ; "mild, right before OHS"  . S/P CABG x 2 - LIMA-LAD, SVG-OM, 01/27/13 01/28/2013  . Smoker   . Thoracic aneurysm without mention of rupture     ROS:   All systems reviewed and negative except as noted in the HPI.        Past Surgical History:  Procedure Laterality Date  . ABDOMINAL HYSTERECTOMY    . BI-VENTRICULAR IMPLANTABLE CARDIOVERTER DEFIBRILLATOR N/A 06/01/2013   Procedure: BI-VENTRICULAR  IMPLANTABLE CARDIOVERTER DEFIBRILLATOR  (CRT-D);  Surgeon: Evans Lance, MD;  Location: [facility] CATH LAB;  Service: Cardiovascular;  Laterality: N/A;  . COLONOSCOPY WITH PROPOFOL N/A 10/07/2016   Procedure: COLONOSCOPY WITH PROPOFOL;  Surgeon: Manus Gunning, MD;  Location: [facility];  Service: Gastroenterology;  Laterality: N/A;  . CORONARY ARTERY BYPASS GRAFT N/A 01/27/2013   Procedure: CORONARY ARTERY BYPASS GRAFTING times two using Right Greater Saphenous Vein Graft Harvsted Endoscopically and Left Internal Mammary Artery;  Surgeon: Gaye Pollack, MD;  Location: [place] OR;  Service: Open Heart Surgery;  Laterality: N/A;  . DILATION AND CURETTAGE OF UTERUS    . FRACTURE SURGERY    . INTRAOPERATIVE TRANSESOPHAGEAL ECHOCARDIOGRAM N/A 01/27/2013   Procedure: INTRAOPERATIVE TRANSESOPHAGEAL ECHOCARDIOGRAM;  Surgeon: Gaye Pollack, MD;  Location: [facility] OR;  Service: Open Heart Surgery;  Laterality: N/A;  . LEFT HEART CATHETERIZATION WITH CORONARY ANGIOGRAM N/A 01/20/2013   Procedure: LEFT HEART CATHETERIZATION WITH CORONARY ANGIOGRAM;  Surgeon: Leonie Man, MD;  Location: [facility] CATH LAB;  Service: Cardiovascular;  Laterality: N/A;  . RIGHT HEART CATHETERIZATION  01/20/2013   Procedure: RIGHT HEART CATH;  Surgeon: Leonie Man, MD;  Location: [facility] CATH LAB;  Service: Cardiovascular;;  . TUBAL LIGATION            Family History  Problem Relation Age of  Onset  . Hypertension Mother   . Heart disease Father   . Throat cancer Brother   . Brain cancer Brother   . Colon cancer Neg Hx   . Stomach cancer Neg Hx   . Rectal cancer Neg Hx   . Liver cancer Neg Hx      Social History        Socioeconomic History  . Marital status: Divorced    Spouse name: Not on file  . Number of children: 3  . Years of education: Not on file  . Highest education level: Not on file  Occupational History  . Occupation: retired  Tobacco Use  . Smoking status: Current Some Day  Smoker    Packs/day: 0.12    Years: 50.00    Pack years: 6.00    Types: Cigarettes  . Smokeless tobacco: Never Used  Vaping Use  . Vaping Use: Never used  Substance and Sexual Activity  . Alcohol use: Yes    Comment: 06/01/2013 "couple times/yr I'll have 1-2 drinks"  . Drug use: No  . Sexual activity: Not Currently    Birth control/protection: Surgical  Other Topics Concern  . Not on file  Social History Narrative  . Not on file   Social Determinants of Health      Financial Resource Strain:   . Difficulty of Paying Living Expenses: Not on file  Food Insecurity:   . Worried About Charity fundraiser in the Last Year: Not on file  . Ran Out of Food in the Last Year: Not on file  Transportation Needs:   . Lack of Transportation (Medical): Not on file  . Lack of Transportation (Non-Medical): Not on file  Physical Activity:   . Days of Exercise per Week: Not on file  . Minutes of Exercise per Session: Not on file  Stress:   . Feeling of Stress : Not on file  Social Connections:   . Frequency of Communication with Friends and Family: Not on file  . Frequency of Social Gatherings with Friends and Family: Not on file  . Attends Religious Services: Not on file  . Active Member of Clubs or Organizations: Not on file  . Attends Archivist Meetings: Not on file  . Marital Status: Not on file  Intimate Partner Violence:   . Fear of Current or Ex-Partner: Not on file  . Emotionally Abused: Not on file  . Physically Abused: Not on file  . Sexually Abused: Not on file     BP 134/80   Pulse 67   Ht 5\' 4"  (1.626 m)   Wt 130 lb (59 kg)   SpO2 93%   BMI 22.31 kg/m   Physical Exam:  Stable appearing 75 yo woman, NAD HEENT: Unremarkable Neck:  6 cm JVD, no thyromegally Lymphatics:  No adenopathy Back:  No CVA tenderness Lungs:  Clear with no wheezes HEART:  Regular rate rhythm, no murmurs, no rubs, no clicks Abd:  soft, positive bowel sounds,  no organomegally, no rebound, no guarding Ext:  2 plus pulses, no edema, no cyanosis, no clubbing Skin:  No rashes no nodules Neuro:  CN II through XII intact, motor grossly intact  EKG - nsr with LBBB  DEVICE  Normal device function.  See PaceArt for details.   Assess/Plan: 1. Chronic systolic heart failure - her symptoms are class 2. She will continue her current meds. 2. ICD - her medtronic device has been reprogrammed VVI. 3. LV lead dysfunction -  her device outputs have been turned off due to elevated thresholds and diaphragmatic stimulation.   Kathryn Dixon  EP Attending  Patient seen and examined. Agree with above. The patient presents for ICD gen change out as she has reached ERI. I have reviewed the indications/risks/benefits/goals/expectations and she wishes to proceed.  Carleene Overlie Dayna Geurts,MD

## 2020-10-31 NOTE — Telephone Encounter (Signed)
The patient had a gen change today. She can feel and see her heartbeat through her shirt.

## 2020-11-01 ENCOUNTER — Ambulatory Visit (INDEPENDENT_AMBULATORY_CARE_PROVIDER_SITE_OTHER): Payer: Medicare Other | Admitting: Emergency Medicine

## 2020-11-01 ENCOUNTER — Encounter (HOSPITAL_COMMUNITY): Payer: Self-pay | Admitting: Internal Medicine

## 2020-11-01 DIAGNOSIS — I255 Ischemic cardiomyopathy: Secondary | ICD-10-CM

## 2020-11-01 DIAGNOSIS — Z95810 Presence of automatic (implantable) cardiac defibrillator: Secondary | ICD-10-CM

## 2020-11-01 LAB — CUP PACEART INCLINIC DEVICE CHECK
Battery Voltage: 3.09 V
Brady Statistic AP VP Percent: 10.19 %
Brady Statistic AP VS Percent: 0.13 %
Brady Statistic AS VP Percent: 87.32 %
Brady Statistic AS VS Percent: 2.36 %
Brady Statistic RA Percent Paced: 9.78 %
Brady Statistic RV Percent Paced: 1.64 %
Date Time Interrogation Session: 20220609085949
HighPow Impedance: 45 Ohm
Implantable Lead Implant Date: 20150107
Implantable Lead Implant Date: 20150107
Implantable Lead Implant Date: 20150107
Implantable Lead Location: 753858
Implantable Lead Location: 753859
Implantable Lead Location: 753860
Implantable Lead Model: 4298
Implantable Lead Model: 5076
Implantable Lead Model: 6935
Implantable Pulse Generator Implant Date: 20220608
Lead Channel Impedance Value: 160.941
Lead Channel Impedance Value: 160.941
Lead Channel Impedance Value: 176 Ohm
Lead Channel Impedance Value: 176 Ohm
Lead Channel Impedance Value: 188.1 Ohm
Lead Channel Impedance Value: 304 Ohm
Lead Channel Impedance Value: 304 Ohm
Lead Channel Impedance Value: 304 Ohm
Lead Channel Impedance Value: 304 Ohm
Lead Channel Impedance Value: 342 Ohm
Lead Channel Impedance Value: 399 Ohm
Lead Channel Impedance Value: 418 Ohm
Lead Channel Impedance Value: 475 Ohm
Lead Channel Impedance Value: 513 Ohm
Lead Channel Impedance Value: 532 Ohm
Lead Channel Impedance Value: 589 Ohm
Lead Channel Impedance Value: 608 Ohm
Lead Channel Impedance Value: 608 Ohm
Lead Channel Pacing Threshold Amplitude: 0.75 V
Lead Channel Pacing Threshold Amplitude: 1 V
Lead Channel Pacing Threshold Amplitude: 1.75 V
Lead Channel Pacing Threshold Pulse Width: 0.4 ms
Lead Channel Pacing Threshold Pulse Width: 0.4 ms
Lead Channel Pacing Threshold Pulse Width: 1 ms
Lead Channel Sensing Intrinsic Amplitude: 2.125 mV
Lead Channel Sensing Intrinsic Amplitude: 25.125 mV
Lead Channel Setting Pacing Amplitude: 2 V
Lead Channel Setting Pacing Amplitude: 2.25 V
Lead Channel Setting Pacing Amplitude: 3.5 V
Lead Channel Setting Pacing Pulse Width: 0.4 ms
Lead Channel Setting Pacing Pulse Width: 1 ms
Lead Channel Setting Sensing Sensitivity: 0.3 mV

## 2020-11-01 NOTE — Patient Instructions (Signed)
Call the office when you get home for help sending a remote transmission from your home monitor. Take off the clear dressing this afternoon at 3 pm.  Device Clinic: [phone]

## 2020-11-01 NOTE — Progress Notes (Signed)
CRT-D device check in office. Dressing dry and intact. Thresholds and sensing consistent with previous device measurements. Lead impedance trends stable over time. No mode switch episodes recorded. No ventricular arrhythmia episodes recorded. Patient bi-ventricularly pacing 91% of the time. Device programmed with appropriate safety margins. Heart failure diagnostics reviewed and trends are stable for patient. Audible/vibratory alerts demonstrated for patient. patient feeling PNS at LV1  to coil with threshold of 1.5 V @ 1 ms. Programmed L2- coil prior to gen change 10/31/20. Programmed LV2 to coil with no PNS with threshold of 1.75V and 1 ms. Estimated longevity beginning of service.  Patient enrolled in remote follow up and next remote 01/30/21. Follow-up wound check 11/13/20 in device clinic. Patient education completed including shock plan.

## 2020-11-06 NOTE — Progress Notes (Signed)
Remote ICD transmission.   

## 2020-11-06 NOTE — Addendum Note (Signed)
Addended by: Douglass Rivers D on: 11/06/2020 04:54 PM   Modules accepted: Level of Service

## 2020-11-13 ENCOUNTER — Other Ambulatory Visit: Payer: Self-pay

## 2020-11-13 ENCOUNTER — Ambulatory Visit (INDEPENDENT_AMBULATORY_CARE_PROVIDER_SITE_OTHER): Payer: Medicare Other

## 2020-11-13 DIAGNOSIS — I255 Ischemic cardiomyopathy: Secondary | ICD-10-CM | POA: Diagnosis not present

## 2020-11-13 LAB — CUP PACEART INCLINIC DEVICE CHECK
Battery Remaining Longevity: 92 mo
Battery Voltage: 3.14 V
Brady Statistic AP VP Percent: 14.8 %
Brady Statistic AP VS Percent: 0.38 %
Brady Statistic AS VP Percent: 82.92 %
Brady Statistic AS VS Percent: 1.91 %
Brady Statistic RA Percent Paced: 14.11 %
Brady Statistic RV Percent Paced: 0.33 %
Date Time Interrogation Session: 20220621110518
HighPow Impedance: 55 Ohm
Implantable Lead Implant Date: 20150107
Implantable Lead Implant Date: 20150107
Implantable Lead Implant Date: 20150107
Implantable Lead Location: 753858
Implantable Lead Location: 753859
Implantable Lead Location: 753860
Implantable Lead Model: 4298
Implantable Lead Model: 5076
Implantable Lead Model: 6935
Implantable Pulse Generator Implant Date: 20220608
Lead Channel Impedance Value: 160.941
Lead Channel Impedance Value: 160.941
Lead Channel Impedance Value: 190.884
Lead Channel Impedance Value: 190.884
Lead Channel Impedance Value: 205.2 Ohm
Lead Channel Impedance Value: 304 Ohm
Lead Channel Impedance Value: 304 Ohm
Lead Channel Impedance Value: 342 Ohm
Lead Channel Impedance Value: 342 Ohm
Lead Channel Impedance Value: 342 Ohm
Lead Channel Impedance Value: 456 Ohm
Lead Channel Impedance Value: 475 Ohm
Lead Channel Impedance Value: 513 Ohm
Lead Channel Impedance Value: 532 Ohm
Lead Channel Impedance Value: 551 Ohm
Lead Channel Impedance Value: 665 Ohm
Lead Channel Impedance Value: 703 Ohm
Lead Channel Impedance Value: 722 Ohm
Lead Channel Pacing Threshold Amplitude: 0.75 V
Lead Channel Pacing Threshold Amplitude: 0.75 V
Lead Channel Pacing Threshold Amplitude: 1.5 V
Lead Channel Pacing Threshold Pulse Width: 0.4 ms
Lead Channel Pacing Threshold Pulse Width: 0.4 ms
Lead Channel Pacing Threshold Pulse Width: 1 ms
Lead Channel Sensing Intrinsic Amplitude: 2 mV
Lead Channel Sensing Intrinsic Amplitude: 28.125 mV
Lead Channel Setting Pacing Amplitude: 2 V
Lead Channel Setting Pacing Amplitude: 2.25 V
Lead Channel Setting Pacing Amplitude: 3.5 V
Lead Channel Setting Pacing Pulse Width: 0.4 ms
Lead Channel Setting Pacing Pulse Width: 1 ms
Lead Channel Setting Sensing Sensitivity: 0.3 mV

## 2020-11-13 NOTE — Progress Notes (Signed)
Wound check appointment. Steri-strips removed. Wound without redness or edema. Incision edges approximated, wound well healed. Normal device function. Thresholds, sensing, and impedances consistent with implant measurements. Device programmed at 3.5V for extra safety margin until 3 month visit, RV/LV leads programmed appropriately for chronic leads. Histogram distribution appropriate for patient and level of activity. No mode switches or ventricular arrhythmias noted. Patient educated about wound care, arm mobility, lifting restrictions, shock plan. Patient is enrolled in remote monitoring, next scheduled check 01/31/21.  ROV with Dr. Lovena Le on 02/08/21.

## 2020-11-22 DIAGNOSIS — E7849 Other hyperlipidemia: Secondary | ICD-10-CM | POA: Diagnosis not present

## 2020-11-22 DIAGNOSIS — I10 Essential (primary) hypertension: Secondary | ICD-10-CM | POA: Diagnosis not present

## 2021-01-23 DIAGNOSIS — I10 Essential (primary) hypertension: Secondary | ICD-10-CM | POA: Diagnosis not present

## 2021-01-23 DIAGNOSIS — E7849 Other hyperlipidemia: Secondary | ICD-10-CM | POA: Diagnosis not present

## 2021-01-31 ENCOUNTER — Ambulatory Visit (INDEPENDENT_AMBULATORY_CARE_PROVIDER_SITE_OTHER): Payer: Medicare Other

## 2021-01-31 DIAGNOSIS — I255 Ischemic cardiomyopathy: Secondary | ICD-10-CM

## 2021-01-31 LAB — CUP PACEART REMOTE DEVICE CHECK
Battery Remaining Longevity: 71 mo
Battery Voltage: 3.04 V
Brady Statistic AP VP Percent: 8.52 %
Brady Statistic AP VS Percent: 0.15 %
Brady Statistic AS VP Percent: 88.83 %
Brady Statistic AS VS Percent: 2.51 %
Brady Statistic RA Percent Paced: 8.3 %
Brady Statistic RV Percent Paced: 2.54 %
Date Time Interrogation Session: 20220908022706
HighPow Impedance: 54 Ohm
Implantable Lead Implant Date: 20150107
Implantable Lead Implant Date: 20150107
Implantable Lead Implant Date: 20150107
Implantable Lead Location: 753858
Implantable Lead Location: 753859
Implantable Lead Location: 753860
Implantable Lead Model: 4298
Implantable Lead Model: 5076
Implantable Lead Model: 6935
Implantable Pulse Generator Implant Date: 20220608
Lead Channel Impedance Value: 189.525
Lead Channel Impedance Value: 204.14 Ohm
Lead Channel Impedance Value: 226.51 Ohm
Lead Channel Impedance Value: 240.906
Lead Channel Impedance Value: 247.704
Lead Channel Impedance Value: 304 Ohm
Lead Channel Impedance Value: 361 Ohm
Lead Channel Impedance Value: 361 Ohm
Lead Channel Impedance Value: 399 Ohm
Lead Channel Impedance Value: 418 Ohm
Lead Channel Impedance Value: 456 Ohm
Lead Channel Impedance Value: 475 Ohm
Lead Channel Impedance Value: 608 Ohm
Lead Channel Impedance Value: 646 Ohm
Lead Channel Impedance Value: 703 Ohm
Lead Channel Impedance Value: 874 Ohm
Lead Channel Impedance Value: 931 Ohm
Lead Channel Impedance Value: 950 Ohm
Lead Channel Pacing Threshold Amplitude: 0.625 V
Lead Channel Pacing Threshold Amplitude: 0.875 V
Lead Channel Pacing Threshold Amplitude: 2 V
Lead Channel Pacing Threshold Pulse Width: 0.4 ms
Lead Channel Pacing Threshold Pulse Width: 0.4 ms
Lead Channel Pacing Threshold Pulse Width: 1 ms
Lead Channel Sensing Intrinsic Amplitude: 2.25 mV
Lead Channel Sensing Intrinsic Amplitude: 2.25 mV
Lead Channel Sensing Intrinsic Amplitude: 28.125 mV
Lead Channel Sensing Intrinsic Amplitude: 28.125 mV
Lead Channel Setting Pacing Amplitude: 2 V
Lead Channel Setting Pacing Amplitude: 2.75 V
Lead Channel Setting Pacing Amplitude: 3.5 V
Lead Channel Setting Pacing Pulse Width: 0.4 ms
Lead Channel Setting Pacing Pulse Width: 1 ms
Lead Channel Setting Sensing Sensitivity: 0.3 mV

## 2021-02-08 ENCOUNTER — Ambulatory Visit (INDEPENDENT_AMBULATORY_CARE_PROVIDER_SITE_OTHER): Payer: Medicare Other | Admitting: Internal Medicine

## 2021-02-08 ENCOUNTER — Other Ambulatory Visit: Payer: Self-pay

## 2021-02-08 VITALS — BP 142/80 | HR 86 | Ht 64.0 in | Wt 123.8 lb

## 2021-02-08 DIAGNOSIS — I5022 Chronic systolic (congestive) heart failure: Secondary | ICD-10-CM | POA: Diagnosis not present

## 2021-02-08 NOTE — Progress Notes (Signed)
HPI Mrs. Bargiel returns today for followup. She is a pleasant 75 yo woman with an ICM, chronic systolic heart failure, LBBB, s/p BiV ICD. In the interim, she has done well.  No chest pain. No ICD shock. Her LV thresholds and diaghragmatic stim have stabilized. She has cut back on her smoking but still has not stopped. No chest pain or sob. No Known Allergies   Current Outpatient Medications  Medication Sig Dispense Refill   aspirin EC 81 MG tablet Take 81 mg by mouth every evening.     hydrochlorothiazide (MICROZIDE) 12.5 MG capsule TAKE 1 CAPSULE BY MOUTH EVERY DAY (Patient taking differently: Take 12.5 mg by mouth in the morning.) 90 capsule 1   lisinopril (ZESTRIL) 5 MG tablet TAKE 1 TABLET BY MOUTH EVERY DAY (Patient taking differently: Take 5 mg by mouth every evening.) 90 tablet 3   metoprolol tartrate (LOPRESSOR) 50 MG tablet TAKE 1 TABLET(50 MG) BY MOUTH TWICE DAILY (Patient taking differently: Take 50 mg by mouth 2 (two) times daily.) 180 tablet 1   vitamin E 200 UNIT capsule Take 200 Units by mouth every evening.     No current facility-administered medications for this visit.     Past Medical History:  Diagnosis Date   Coronary artery disease    Exertional shortness of breath    Hypertension    Ischemic cardiomyopathy    LBBB (left bundle branch block)    Myocardial infarction ([place]) 1990's;01/2013   ; "mild, right before OHS"   S/P CABG x 2 - LIMA-LAD, SVG-OM, 01/27/13 01/28/2013   Smoker    Thoracic aneurysm without mention of rupture     ROS:   All systems reviewed and negative except as noted in the HPI.   Past Surgical History:  Procedure Laterality Date   ABDOMINAL HYSTERECTOMY     BI-VENTRICULAR IMPLANTABLE CARDIOVERTER DEFIBRILLATOR N/A 06/01/2013   Procedure: BI-VENTRICULAR IMPLANTABLE CARDIOVERTER DEFIBRILLATOR  (CRT-D);  Surgeon: Evans Lance, MD;  Location: [facility] CATH LAB;  Service: Cardiovascular;  Laterality: N/A;   COLONOSCOPY WITH PROPOFOL N/A  10/07/2016   Procedure: COLONOSCOPY WITH PROPOFOL;  Surgeon: Manus Gunning, MD;  Location: [facility] ENDOSCOPY;  Service: Gastroenterology;  Laterality: N/A;   CORONARY ARTERY BYPASS GRAFT N/A 01/27/2013   Procedure: CORONARY ARTERY BYPASS GRAFTING times two using Right Greater Saphenous Vein Graft [place] Endoscopically and Left Internal Mammary Artery;  Surgeon: Gaye Pollack, MD;  Location: [place] OR;  Service: Open Heart Surgery;  Laterality: N/A;   DILATION AND CURETTAGE OF UTERUS     FRACTURE SURGERY     ICD GENERATOR CHANGEOUT N/A 10/31/2020   Procedure: ICD GENERATOR CHANGEOUT;  Surgeon: Evans Lance, MD;  Location: [place] CV LAB;  Service: Cardiovascular;  Laterality: N/A;   INTRAOPERATIVE TRANSESOPHAGEAL ECHOCARDIOGRAM N/A 01/27/2013   Procedure: INTRAOPERATIVE TRANSESOPHAGEAL ECHOCARDIOGRAM;  Surgeon: Gaye Pollack, MD;  Location: [place] OR;  Service: Open Heart Surgery;  Laterality: N/A;   LEFT HEART CATHETERIZATION WITH CORONARY ANGIOGRAM N/A 01/20/2013   Procedure: LEFT HEART CATHETERIZATION WITH CORONARY ANGIOGRAM;  Surgeon: Leonie Man, MD;  Location: [facility] CATH LAB;  Service: Cardiovascular;  Laterality: N/A;   RIGHT HEART CATHETERIZATION  01/20/2013   Procedure: RIGHT HEART CATH;  Surgeon: Leonie Man, MD;  Location: [facility] CATH LAB;  Service: Cardiovascular;;   TUBAL LIGATION       Family History  Problem Relation Age of Onset   Hypertension Mother    Heart disease Father  Throat cancer Brother    Brain cancer Brother    Colon cancer Neg Hx    Stomach cancer Neg Hx    Rectal cancer Neg Hx    Liver cancer Neg Hx      Social History   Socioeconomic History   Marital status: Divorced    Spouse name: Not on file   Number of children: 3   Years of education: Not on file   Highest education level: Not on file  Occupational History   Occupation: retired  Tobacco Use   Smoking status: Some Days    Packs/day: 0.12    Years: 50.00    Pack years: 6.00    Types:  Cigarettes   Smokeless tobacco: Never  Vaping Use   Vaping Use: Never used  Substance and Sexual Activity   Alcohol use: Yes    Comment: 06/01/2013 "couple times/yr I'll have 1-2 drinks"   Drug use: No   Sexual activity: Not Currently    Birth control/protection: Surgical  Other Topics Concern   Not on file  Social History Narrative   Not on file   Social Determinants of Health   Financial Resource Strain: Not on file  Food Insecurity: Not on file  Transportation Needs: Not on file  Physical Activity: Not on file  Stress: Not on file  Social Connections: Not on file  Intimate Partner Violence: Not on file     BP (!) 142/80   Pulse 86   Ht '5\' 4"'$  (1.626 m)   Wt 123 lb 12.8 oz (56.2 kg)   SpO2 95%   BMI 21.25 kg/m   Physical Exam:  Well appearing 75 yo woman, NAD HEENT: Unremarkable Neck:  No JVD, no thyromegally Lymphatics:  No adenopathy Back:  No CVA tenderness Lungs:  Clear with no wheezes HEART:  Regular rate rhythm, no murmurs, no rubs, no clicks Abd:  soft, positive bowel sounds, no organomegally, no rebound, no guarding Ext:  2 plus pulses, no edema, no cyanosis, no clubbing Skin:  No rashes no nodules Neuro:  CN II through XII intact, motor grossly intact  EKG - nsr with biv pacing  DEVICE  Normal device function.  See PaceArt for details.   Assess/Plan:  1. Chronic systolic heart failure - her symptoms are class 2. She will continue her current meds. 2. ICD - her medtronic device is programmed DDD. 3. Diaghragmatic stimulation - this has resolved with the appropriate programming changes 4. Tobacco abuse - she is still smoking. I encouraged her to stop.     Carleene Overlie Eathen Budreau,MD

## 2021-02-08 NOTE — Progress Notes (Signed)
Remote ICD transmission.   

## 2021-02-08 NOTE — Patient Instructions (Addendum)
Medication Instructions:  Your physician recommends that you continue on your current medications as directed. Please refer to the Current Medication list given to you today.  *If you need a refill on your cardiac medications before your next appointment, please call your pharmacy*   Lab Work: None ordered If you have labs (blood work) drawn today and your tests are completely normal, you will receive your results only by: [place] (if you have MyChart) OR A paper copy in the mail If you have any lab test that is abnormal or we need to change your treatment, we will call you to review the results.   Testing/Procedures: None ordered   Follow-Up: At [facility], you and your health needs are our priority.  As part of our continuing mission to provide you with exceptional heart care, we have created designated Provider Care Teams.  These Care Teams include your primary Cardiologist (physician) and Advanced Practice Providers (APPs -  Physician Assistants and Nurse Practitioners) who all work together to provide you with the care you need, when you need it.  We recommend signing up for the patient portal called "MyChart".  Sign up information is provided on this After Visit Summary.  MyChart is used to connect with patients for Virtual Visits (Telemedicine).  Patients are able to view lab/test results, encounter notes, upcoming appointments, etc.  Non-urgent messages can be sent to your provider as well.   To learn more about what you can do with MyChart, go to [email or web address].    Remote monitoring is used to monitor your Pacemaker or ICD from home. This monitoring reduces the number of office visits required to check your device to one time per year. It allows [place] to keep an eye on the functioning of your device to ensure it is working properly. You are scheduled for a device check from home on 05/02/2021. You may send your transmission at any time that day. If you have a  wireless device, the transmission will be sent automatically. After your physician reviews your transmission, you will receive a postcard with your next transmission date.  Your next appointment:   1 year(s)  The format for your next appointment:   In Person  Provider:   Dr. Lovena Le   Thank you for choosing [facility]!!   Trinidad Curet, RN [phone]    Other Instructions

## 2021-02-22 DIAGNOSIS — I10 Essential (primary) hypertension: Secondary | ICD-10-CM | POA: Diagnosis not present

## 2021-02-22 DIAGNOSIS — E7849 Other hyperlipidemia: Secondary | ICD-10-CM | POA: Diagnosis not present

## 2021-03-25 DIAGNOSIS — E7849 Other hyperlipidemia: Secondary | ICD-10-CM | POA: Diagnosis not present

## 2021-03-25 DIAGNOSIS — I10 Essential (primary) hypertension: Secondary | ICD-10-CM | POA: Diagnosis not present

## 2021-04-08 ENCOUNTER — Other Ambulatory Visit: Payer: Self-pay | Admitting: *Deleted

## 2021-04-08 MED ORDER — METOPROLOL TARTRATE 50 MG PO TABS: 50.0000 mg | ORAL_TABLET | Freq: Two times a day (BID) | ORAL | 3 refills | Status: DC

## 2021-04-15 ENCOUNTER — Other Ambulatory Visit: Payer: Self-pay | Admitting: Internal Medicine

## 2021-05-02 ENCOUNTER — Ambulatory Visit (INDEPENDENT_AMBULATORY_CARE_PROVIDER_SITE_OTHER): Payer: Medicare Other

## 2021-05-02 DIAGNOSIS — I255 Ischemic cardiomyopathy: Secondary | ICD-10-CM | POA: Diagnosis not present

## 2021-05-02 LAB — CUP PACEART REMOTE DEVICE CHECK
Battery Remaining Longevity: 88 mo
Battery Voltage: 3 V
Brady Statistic AP VP Percent: 5.13 %
Brady Statistic AP VS Percent: 0.08 %
Brady Statistic AS VP Percent: 91.9 %
Brady Statistic AS VS Percent: 2.89 %
Brady Statistic RA Percent Paced: 5.07 %
Brady Statistic RV Percent Paced: 6.78 %
Date Time Interrogation Session: 20221208033425
HighPow Impedance: 63 Ohm
Implantable Lead Implant Date: 20150107
Implantable Lead Implant Date: 20150107
Implantable Lead Implant Date: 20150107
Implantable Lead Location: 753858
Implantable Lead Location: 753859
Implantable Lead Location: 753860
Implantable Lead Model: 4298
Implantable Lead Model: 5076
Implantable Lead Model: 6935
Implantable Pulse Generator Implant Date: 20220608
Lead Channel Impedance Value: 1007 Ohm
Lead Channel Impedance Value: 1064 Ohm
Lead Channel Impedance Value: 1064 Ohm
Lead Channel Impedance Value: 232.653
Lead Channel Impedance Value: 246.635
Lead Channel Impedance Value: 270.508
Lead Channel Impedance Value: 277.083
Lead Channel Impedance Value: 289.597
Lead Channel Impedance Value: 361 Ohm
Lead Channel Impedance Value: 418 Ohm
Lead Channel Impedance Value: 456 Ohm
Lead Channel Impedance Value: 475 Ohm
Lead Channel Impedance Value: 475 Ohm
Lead Channel Impedance Value: 513 Ohm
Lead Channel Impedance Value: 589 Ohm
Lead Channel Impedance Value: 665 Ohm
Lead Channel Impedance Value: 817 Ohm
Lead Channel Impedance Value: 874 Ohm
Lead Channel Pacing Threshold Amplitude: 0.625 V
Lead Channel Pacing Threshold Amplitude: 0.875 V
Lead Channel Pacing Threshold Amplitude: 2 V
Lead Channel Pacing Threshold Pulse Width: 0.4 ms
Lead Channel Pacing Threshold Pulse Width: 0.4 ms
Lead Channel Pacing Threshold Pulse Width: 1 ms
Lead Channel Sensing Intrinsic Amplitude: 2.25 mV
Lead Channel Sensing Intrinsic Amplitude: 2.25 mV
Lead Channel Sensing Intrinsic Amplitude: 31.625 mV
Lead Channel Sensing Intrinsic Amplitude: 31.625 mV
Lead Channel Setting Pacing Amplitude: 1.5 V
Lead Channel Setting Pacing Amplitude: 2 V
Lead Channel Setting Pacing Amplitude: 2.5 V
Lead Channel Setting Pacing Pulse Width: 0.4 ms
Lead Channel Setting Pacing Pulse Width: 1 ms
Lead Channel Setting Sensing Sensitivity: 0.3 mV

## 2021-05-10 NOTE — Progress Notes (Signed)
Remote ICD transmission.   

## 2021-06-21 ENCOUNTER — Other Ambulatory Visit: Payer: Self-pay | Admitting: Internal Medicine

## 2021-06-23 DIAGNOSIS — I10 Essential (primary) hypertension: Secondary | ICD-10-CM | POA: Diagnosis not present

## 2021-06-23 DIAGNOSIS — E7849 Other hyperlipidemia: Secondary | ICD-10-CM | POA: Diagnosis not present

## 2021-08-01 ENCOUNTER — Ambulatory Visit (INDEPENDENT_AMBULATORY_CARE_PROVIDER_SITE_OTHER): Payer: Medicare Other

## 2021-08-01 DIAGNOSIS — I255 Ischemic cardiomyopathy: Secondary | ICD-10-CM | POA: Diagnosis not present

## 2021-08-01 DIAGNOSIS — I5022 Chronic systolic (congestive) heart failure: Secondary | ICD-10-CM

## 2021-08-01 LAB — CUP PACEART REMOTE DEVICE CHECK
Battery Remaining Longevity: 61 mo
Battery Voltage: 2.99 V
Brady Statistic AP VP Percent: 6.22 %
Brady Statistic AP VS Percent: 0.09 %
Brady Statistic AS VP Percent: 91.36 %
Brady Statistic AS VS Percent: 2.33 %
Brady Statistic RA Percent Paced: 6.11 %
Brady Statistic RV Percent Paced: 3.44 %
Date Time Interrogation Session: 20230309012306
HighPow Impedance: 64 Ohm
Implantable Lead Implant Date: 20150107
Implantable Lead Implant Date: 20150107
Implantable Lead Implant Date: 20150107
Implantable Lead Location: 753858
Implantable Lead Location: 753859
Implantable Lead Location: 753860
Implantable Lead Model: 4298
Implantable Lead Model: 5076
Implantable Lead Model: 6935
Implantable Pulse Generator Implant Date: 20220608
Lead Channel Impedance Value: 1026 Ohm
Lead Channel Impedance Value: 1083 Ohm
Lead Channel Impedance Value: 1083 Ohm
Lead Channel Impedance Value: 241.412
Lead Channel Impedance Value: 256.5 Ohm
Lead Channel Impedance Value: 276.59 Ohm
Lead Channel Impedance Value: 296.578
Lead Channel Impedance Value: 296.578
Lead Channel Impedance Value: 342 Ohm
Lead Channel Impedance Value: 418 Ohm
Lead Channel Impedance Value: 456 Ohm
Lead Channel Impedance Value: 475 Ohm
Lead Channel Impedance Value: 513 Ohm
Lead Channel Impedance Value: 513 Ohm
Lead Channel Impedance Value: 589 Ohm
Lead Channel Impedance Value: 703 Ohm
Lead Channel Impedance Value: 836 Ohm
Lead Channel Impedance Value: 874 Ohm
Lead Channel Pacing Threshold Amplitude: 0.75 V
Lead Channel Pacing Threshold Amplitude: 1 V
Lead Channel Pacing Threshold Amplitude: 2.75 V
Lead Channel Pacing Threshold Pulse Width: 0.4 ms
Lead Channel Pacing Threshold Pulse Width: 0.4 ms
Lead Channel Pacing Threshold Pulse Width: 1 ms
Lead Channel Sensing Intrinsic Amplitude: 1.875 mV
Lead Channel Sensing Intrinsic Amplitude: 1.875 mV
Lead Channel Sensing Intrinsic Amplitude: 31.625 mV
Lead Channel Sensing Intrinsic Amplitude: 31.625 mV
Lead Channel Setting Pacing Amplitude: 1.5 V
Lead Channel Setting Pacing Amplitude: 2 V
Lead Channel Setting Pacing Amplitude: 3.5 V
Lead Channel Setting Pacing Pulse Width: 0.4 ms
Lead Channel Setting Pacing Pulse Width: 1 ms
Lead Channel Setting Sensing Sensitivity: 0.3 mV

## 2021-08-14 NOTE — Progress Notes (Signed)
Remote ICD transmission.   

## 2021-10-07 ENCOUNTER — Ambulatory Visit
Admission: RE | Admit: 2021-10-07 | Discharge: 2021-10-07 | Disposition: A | Discharge status: Home / Self Care | Payer: Medicare Other | Source: Ambulatory Visit | Attending: Family Medicine | Admitting: Family Medicine

## 2021-10-07 ENCOUNTER — Other Ambulatory Visit: Payer: Self-pay | Admitting: Family Medicine

## 2021-10-07 DIAGNOSIS — R059 Cough, unspecified: Secondary | ICD-10-CM | POA: Diagnosis not present

## 2021-10-07 DIAGNOSIS — N390 Urinary tract infection, site not specified: Secondary | ICD-10-CM | POA: Diagnosis not present

## 2021-10-07 DIAGNOSIS — F17200 Nicotine dependence, unspecified, uncomplicated: Secondary | ICD-10-CM | POA: Diagnosis not present

## 2021-10-07 DIAGNOSIS — N3281 Overactive bladder: Secondary | ICD-10-CM | POA: Diagnosis not present

## 2021-10-07 DIAGNOSIS — I10 Essential (primary) hypertension: Secondary | ICD-10-CM | POA: Diagnosis not present

## 2021-10-07 DIAGNOSIS — I312 Hemopericardium, not elsewhere classified: Secondary | ICD-10-CM | POA: Diagnosis not present

## 2021-10-07 DIAGNOSIS — R634 Abnormal weight loss: Secondary | ICD-10-CM | POA: Diagnosis not present

## 2021-10-07 DIAGNOSIS — E1169 Type 2 diabetes mellitus with other specified complication: Secondary | ICD-10-CM | POA: Diagnosis not present

## 2021-10-07 DIAGNOSIS — I639 Cerebral infarction, unspecified: Secondary | ICD-10-CM | POA: Diagnosis not present

## 2021-10-07 DIAGNOSIS — E559 Vitamin D deficiency, unspecified: Secondary | ICD-10-CM | POA: Diagnosis not present

## 2021-10-07 DIAGNOSIS — Z0000 Encounter for general adult medical examination without abnormal findings: Secondary | ICD-10-CM | POA: Diagnosis not present

## 2021-10-18 DIAGNOSIS — R262 Difficulty in walking, not elsewhere classified: Secondary | ICD-10-CM | POA: Diagnosis not present

## 2021-10-18 DIAGNOSIS — E7849 Other hyperlipidemia: Secondary | ICD-10-CM | POA: Diagnosis not present

## 2021-10-18 DIAGNOSIS — J399 Disease of upper respiratory tract, unspecified: Secondary | ICD-10-CM | POA: Diagnosis not present

## 2021-10-18 DIAGNOSIS — I10 Essential (primary) hypertension: Secondary | ICD-10-CM | POA: Diagnosis not present

## 2021-10-18 DIAGNOSIS — E1169 Type 2 diabetes mellitus with other specified complication: Secondary | ICD-10-CM | POA: Diagnosis not present

## 2021-10-23 DIAGNOSIS — I10 Essential (primary) hypertension: Secondary | ICD-10-CM | POA: Diagnosis not present

## 2021-10-23 DIAGNOSIS — E7849 Other hyperlipidemia: Secondary | ICD-10-CM | POA: Diagnosis not present

## 2021-10-31 ENCOUNTER — Ambulatory Visit (INDEPENDENT_AMBULATORY_CARE_PROVIDER_SITE_OTHER): Payer: Medicare Other

## 2021-10-31 DIAGNOSIS — I255 Ischemic cardiomyopathy: Secondary | ICD-10-CM | POA: Diagnosis not present

## 2021-10-31 LAB — CUP PACEART REMOTE DEVICE CHECK
Battery Remaining Longevity: 46 mo
Battery Voltage: 2.97 V
Brady Statistic AP VP Percent: 3.15 %
Brady Statistic AP VS Percent: 0.05 %
Brady Statistic AS VP Percent: 93.49 %
Brady Statistic AS VS Percent: 3.31 %
Brady Statistic RA Percent Paced: 3.15 %
Brady Statistic RV Percent Paced: 8.53 %
Date Time Interrogation Session: 20230608002206
HighPow Impedance: 82 Ohm
Implantable Lead Implant Date: 20150107
Implantable Lead Implant Date: 20150107
Implantable Lead Implant Date: 20150107
Implantable Lead Location: 753858
Implantable Lead Location: 753859
Implantable Lead Location: 753860
Implantable Lead Model: 4298
Implantable Lead Model: 5076
Implantable Lead Model: 6935
Implantable Pulse Generator Implant Date: 20220608
Lead Channel Impedance Value: 1064 Ohm
Lead Channel Impedance Value: 1083 Ohm
Lead Channel Impedance Value: 1083 Ohm
Lead Channel Impedance Value: 246.635
Lead Channel Impedance Value: 250.943
Lead Channel Impedance Value: 277.083
Lead Channel Impedance Value: 289.597
Lead Channel Impedance Value: 295.556
Lead Channel Impedance Value: 399 Ohm
Lead Channel Impedance Value: 475 Ohm
Lead Channel Impedance Value: 475 Ohm
Lead Channel Impedance Value: 513 Ohm
Lead Channel Impedance Value: 513 Ohm
Lead Channel Impedance Value: 532 Ohm
Lead Channel Impedance Value: 608 Ohm
Lead Channel Impedance Value: 665 Ohm
Lead Channel Impedance Value: 874 Ohm
Lead Channel Impedance Value: 893 Ohm
Lead Channel Pacing Threshold Amplitude: 0.5 V
Lead Channel Pacing Threshold Amplitude: 0.875 V
Lead Channel Pacing Threshold Amplitude: 3 V
Lead Channel Pacing Threshold Pulse Width: 0.4 ms
Lead Channel Pacing Threshold Pulse Width: 0.4 ms
Lead Channel Pacing Threshold Pulse Width: 1 ms
Lead Channel Sensing Intrinsic Amplitude: 3.5 mV
Lead Channel Sensing Intrinsic Amplitude: 3.5 mV
Lead Channel Sensing Intrinsic Amplitude: 31.625 mV
Lead Channel Sensing Intrinsic Amplitude: 31.625 mV
Lead Channel Setting Pacing Amplitude: 1.5 V
Lead Channel Setting Pacing Amplitude: 2 V
Lead Channel Setting Pacing Amplitude: 4 V
Lead Channel Setting Pacing Pulse Width: 0.4 ms
Lead Channel Setting Pacing Pulse Width: 1 ms
Lead Channel Setting Sensing Sensitivity: 0.3 mV

## 2021-11-08 NOTE — Progress Notes (Signed)
Remote ICD transmission.   

## 2021-11-12 DIAGNOSIS — Z743 Need for continuous supervision: Secondary | ICD-10-CM | POA: Diagnosis not present

## 2021-11-12 DIAGNOSIS — R4020 Unspecified coma: Secondary | ICD-10-CM | POA: Diagnosis not present

## 2021-11-23 DIAGNOSIS — 419620001 Death: Secondary | SNOMED CT | POA: Diagnosis not present

## 2021-11-23 DEATH — deceased

## 2022-02-05 ENCOUNTER — Telehealth: Payer: Self-pay

## 2022-02-05 NOTE — Patient Outreach (Signed)
  Care Coordination   02/05/2022 Name: Kathryn Dixon MRN: [id number] DOB: [birth year]   Care Coordination Outreach Attempts:  An unsuccessful telephone outreach was attempted today to offer the patient information about available care coordination services as a benefit of their health plan.   Follow Up Plan:  Additional outreach attempts will be made to offer the patient care coordination information and services.   Encounter Outcome:  No Answer  Care Coordination Interventions Activated:  No   Care Coordination Interventions:  No, not indicated     Enzo Montgomery, RN,BSN,CCM [place] [facility] Telephonic Care Management Coordinator Direct Phone: [phone] Toll Free: [phone] Fax: [phone]

## 2022-02-12 ENCOUNTER — Telehealth: Payer: Self-pay

## 2022-02-12 NOTE — Patient Outreach (Signed)
  Care Coordination   02/12/2022 Name: Kathryn Dixon MRN: [id number] DOB: 22-Nov-1945   Care Coordination Outreach Attempts:  A second unsuccessful outreach was attempted today to offer the patient with information about available care coordination services as a benefit of their health plan.     Follow Up Plan:  Additional outreach attempts will be made to offer the patient care coordination information and services.   Encounter Outcome:  No Answer  Care Coordination Interventions Activated:  No   Care Coordination Interventions:  No, not indicated    Enzo Montgomery, RN,BSN,CCM [place] [facility] Telephonic Care Management Coordinator Direct Phone: [phone] Toll Free: [phone] Fax: [phone]

## 2022-02-14 ENCOUNTER — Telehealth: Payer: Self-pay

## 2022-02-14 NOTE — Patient Outreach (Signed)
  Care Coordination   02/14/2022 Name: Kathryn Dixon MRN: [id number] DOB: November 15, 1945   Care Coordination Outreach Attempts:  A third unsuccessful outreach was attempted today to offer the patient with information about available care coordination services as a benefit of their health plan.   Follow Up Plan:  No further outreach attempts will be made at this time. We have been unable to contact the patient to offer or enroll patient in care coordination services  Encounter Outcome:  No Answer  Care Coordination Interventions Activated:  No   Care Coordination Interventions:  No, not indicated    Enzo Montgomery, RN,BSN,CCM [place] [facility] Telephonic Care Management Coordinator Direct Phone: [phone] Toll Free: [phone] Fax: [phone]

## 2022-05-05 ENCOUNTER — Other Ambulatory Visit: Payer: Self-pay

## 2022-05-05 MED ORDER — METOPROLOL TARTRATE 50 MG PO TABS: 50.0000 mg | ORAL_TABLET | Freq: Two times a day (BID) | ORAL | 0 refills | Status: DC

## 2022-06-30 ENCOUNTER — Other Ambulatory Visit: Payer: Self-pay | Admitting: *Deleted

## 2022-06-30 ENCOUNTER — Other Ambulatory Visit: Payer: Self-pay

## 2022-06-30 MED ORDER — LISINOPRIL 5 MG PO TABS: 5.0000 mg | ORAL_TABLET | Freq: Every day | ORAL | 0 refills | Status: DC

## 2023-10-29 ENCOUNTER — Ambulatory Visit: Payer: Medicaid Other
